# Patient Record
Sex: Male | Born: 1970 | Race: White | Hispanic: No | Marital: Married | Smoking: Never smoker
Health system: Southern US, Community
[De-identification: ages and names within clinical notes are randomized; demographics above are authoritative.]

## PROBLEM LIST (undated history)

## (undated) DIAGNOSIS — C4491 Basal cell carcinoma of skin, unspecified: Secondary | ICD-10-CM

## (undated) DIAGNOSIS — M5020 Other cervical disc displacement, unspecified cervical region: Secondary | ICD-10-CM

## (undated) DIAGNOSIS — M51369 Other intervertebral disc degeneration, lumbar region without mention of lumbar back pain or lower extremity pain: Secondary | ICD-10-CM

## (undated) DIAGNOSIS — G4730 Sleep apnea, unspecified: Secondary | ICD-10-CM

## (undated) DIAGNOSIS — M25512 Pain in left shoulder: Secondary | ICD-10-CM

## (undated) DIAGNOSIS — C61 Malignant neoplasm of prostate: Secondary | ICD-10-CM

## (undated) DIAGNOSIS — M5136 Other intervertebral disc degeneration, lumbar region: Secondary | ICD-10-CM

## (undated) HISTORY — DX: Basal cell carcinoma of skin, unspecified: C44.91

## (undated) HISTORY — DX: Other intervertebral disc degeneration, lumbar region: M51.36

## (undated) HISTORY — PX: APPENDECTOMY: SHX54

## (undated) HISTORY — DX: Other cervical disc displacement, unspecified cervical region: M50.20

## (undated) HISTORY — PX: EYE MUSCLE SURGERY: SHX370

## (undated) HISTORY — PX: WISDOM TOOTH EXTRACTION: SHX21

## (undated) HISTORY — DX: Sleep apnea, unspecified: G47.30

## (undated) HISTORY — DX: Other intervertebral disc degeneration, lumbar region without mention of lumbar back pain or lower extremity pain: M51.369

---

## 2006-05-23 ENCOUNTER — Ambulatory Visit (HOSPITAL_BASED_OUTPATIENT_CLINIC_OR_DEPARTMENT_OTHER): Admission: RE | Admit: 2006-05-23 | Discharge: 2006-05-23 | Payer: Self-pay | Admitting: Otolaryngology

## 2006-05-26 ENCOUNTER — Ambulatory Visit: Payer: Self-pay | Admitting: Internal Medicine

## 2008-10-23 ENCOUNTER — Ambulatory Visit (HOSPITAL_BASED_OUTPATIENT_CLINIC_OR_DEPARTMENT_OTHER): Admission: RE | Admit: 2008-10-23 | Discharge: 2008-10-23 | Payer: Self-pay | Admitting: Ophthalmology

## 2010-04-15 ENCOUNTER — Ambulatory Visit (HOSPITAL_BASED_OUTPATIENT_CLINIC_OR_DEPARTMENT_OTHER)
Admission: RE | Admit: 2010-04-15 | Discharge: 2010-04-15 | Disposition: A | Discharge status: Home / Self Care | Payer: 59 | Source: Ambulatory Visit | Attending: Ophthalmology | Admitting: Ophthalmology

## 2010-04-15 ENCOUNTER — Ambulatory Visit (HOSPITAL_BASED_OUTPATIENT_CLINIC_OR_DEPARTMENT_OTHER): Admit: 2010-04-15 | Payer: Self-pay | Admitting: Ophthalmology

## 2010-04-15 DIAGNOSIS — IMO0002 Reserved for concepts with insufficient information to code with codable children: Secondary | ICD-10-CM | POA: Insufficient documentation

## 2010-06-03 NOTE — Procedures (Signed)
NAMETIEGAN, Stephen [name]               ACCOUNT NO.:  [id number]   MEDICAL RECORD NO.:  [id number]          PATIENT TYPE:  OUT   LOCATION:  SLEEP CENTER                 FACILITY:  [facility]   PHYSICIAN:  Clinton D. Maple Hudson, MD, FCCP, FACPDATE OF BIRTH:  19-May-1972   DATE OF STUDY:                            NOCTURNAL POLYSOMNOGRAM   REFERRING PHYSICIAN:   INDICATIONS:  Hypersomnia with sleep apnea.  Epworth Sleepiness Score  6/24, BMI 32.6, weight 255 pounds.  Home medication listed as Lexapro.   SLEEP ARCHITECTURE:  Total sleep time 347 minutes with sleep efficiency  81%.  Stage 1 was 6%, stage 2 85%, stage 3 and 4 1%, REM 9% of total  sleep time.  Sleep latency 7.5 minutes, REM latency 394 minutes, awake  after sleep onset 77 minutes, arousal index 4.5.  No bed time medication  was taken.   RESPIRATORY DATA:  Apnea/hypopnea index (AHI, RDI) 0 events per hour.  There were no central or obstructive events identified by standard  scoring rules.   OXYGEN DATA:  Moderate snoring with oxygen desaturation to a nadir of  91%.  Main oxygen saturation through this study was 95% on room air.   CARDIAC DATA:  Normal sinus rhythm.   MOVEMENT/PARASOMNIA:  No movement disturbance or bathroom trips.   IMPRESSION/RECOMMENDATIONS:  1. Sleep architecture notable only for reduced percentage time in REM      which may reflect unfamiliar sleep environment.  2. No sleep disordered breathing abnormality noted.  Moderate AHI 0      per hour.  Moderate snoring with oxygen desaturation to a nadir of      91%.      Clinton D. Maple Hudson, MD, [facility], FACP  Diplomate, Biomedical engineer of Sleep Medicine  Electronically Signed     CDY/MEDQ  D:  05/27/2006 09:42:54  T:  05/28/2006 07:34:50  Job:  [id number]

## 2010-06-08 NOTE — Op Note (Signed)
  NAMEGERTRUDE, Stephen [name]               ACCOUNT NO.:  [id number]  MEDICAL RECORD NO.:  [id number]          PATIENT TYPE:  AMB  LOCATION:                               FACILITY:  [facility]  PHYSICIAN:  Pasty Spillers. Maple Hudson, M.D. DATE OF BIRTH:  1970/03/04  DATE OF PROCEDURE:  04/15/2010 DATE OF DISCHARGE:                              OPERATIVE REPORT   OPERATIVE DIAGNOSIS:  Right hypertropia, residual, status post right inferior oblique muscle recession.  POSTOPERATIVE DIAGNOSIS:  Right hypertropia, residual, status post right inferior oblique muscle recession.  PROCEDURE:  Left inferior rectus muscle recession, 3.0 mm.  SURGEON:  Pasty Spillers. Delpha Perko, MD  ANESTHESIA:  General (laryngeal mask).  COMPLICATIONS:  None.  DESCRIPTION OF PROCEDURE:  After routine preoperative evaluation including informed consent, the patient was taken to the room where he was identified by me.  General anesthesia was induced without difficulty after placement of appropriate monitors.  The patient was prepped and draped in a standard sterile fashion.  Lid speculum was placed in the left eye.  Through an inferotemporal fornix incision through conjunctiva and Tenon fascia, left inferior rectus muscle was engaged on a series of muscle hooks and cleared of its fascial attachment.  The tendon was secured with a double-arm 6-0 Vicryl suture, with a double-locking bite at each border of the muscle, 1 mm from the insertion.  The muscle was disinserted, and was reattached to sclera at a measured distance of 3.0 mm posterior to the original insertion, using direct scleral passes in a crossed swords fashion.  The suture ends were tied securely after the position of the muscle had been checked and found to be accurate.  The conjunctiva was closed with two 6-0 Vicryl sutures.  TobraDex ointment was placed in the eye.  The patient was awakened without difficulty and taken to the recovery room in stable condition having  suffered no intraoperative or immediate postoperative complications.     Pasty Spillers. Maple Hudson, M.D.     Cheron Schaumann  D:  04/15/2010  T:  04/16/2010  Job:  [id number]  Electronically Signed by Verne Carrow M.D. on 06/08/2010 10:19:56 AM

## 2015-03-19 LAB — HEPATIC FUNCTION PANEL
ALK PHOS: 84 (ref 25–125)
ALT: 14 (ref 10–40)
AST: 15 (ref 14–40)
Bilirubin, Total: 0.3

## 2015-03-19 LAB — BASIC METABOLIC PANEL
BUN: 14 (ref 4–21)
CREATININE: 1.1 (ref 0.6–1.3)
Glucose: 85
POTASSIUM: 4.5 (ref 3.4–5.3)
SODIUM: 144 (ref 137–147)

## 2015-03-19 LAB — CBC AND DIFFERENTIAL
HCT: 43 (ref 41–53)
Hemoglobin: 14.6 (ref 13.5–17.5)
Platelets: 261 (ref 150–399)
WBC: 5.3

## 2015-03-19 LAB — TSH: TSH: 2.08 (ref 0.41–5.90)

## 2015-12-21 LAB — BASIC METABOLIC PANEL
BUN: 16 (ref 4–21)
Creatinine: 1.1 (ref 0.6–1.3)
GLUCOSE: 89
POTASSIUM: 4.2 (ref 3.4–5.3)
SODIUM: 140 (ref 137–147)

## 2015-12-21 LAB — CBC AND DIFFERENTIAL
HCT: 45 (ref 41–53)
Hemoglobin: 15.4 (ref 13.5–17.5)
WBC: 4.6

## 2015-12-21 LAB — LIPID PANEL
Cholesterol: 168 (ref 0–200)
HDL: 43 (ref 35–70)
LDL Cholesterol: 107
Triglycerides: 91 (ref 40–160)

## 2015-12-21 LAB — HEPATIC FUNCTION PANEL
ALT: 20 (ref 10–40)
AST: 17 (ref 14–40)
Alkaline Phosphatase: 66 (ref 25–125)
BILIRUBIN, TOTAL: 0.5

## 2015-12-21 LAB — TSH: TSH: 2.95 (ref 0.41–5.90)

## 2016-09-01 ENCOUNTER — Encounter: Payer: Self-pay | Admitting: Family Medicine

## 2016-09-01 ENCOUNTER — Ambulatory Visit (INDEPENDENT_AMBULATORY_CARE_PROVIDER_SITE_OTHER): Payer: BLUE CROSS/BLUE SHIELD | Admitting: Family Medicine

## 2016-09-01 VITALS — BP 128/78 | HR 68 | Temp 98.5°F | Ht 73.0 in | Wt 297.2 lb

## 2016-09-01 DIAGNOSIS — R0683 Snoring: Secondary | ICD-10-CM | POA: Insufficient documentation

## 2016-09-01 DIAGNOSIS — I872 Venous insufficiency (chronic) (peripheral): Secondary | ICD-10-CM

## 2016-09-01 DIAGNOSIS — R6882 Decreased libido: Secondary | ICD-10-CM | POA: Diagnosis not present

## 2016-09-01 DIAGNOSIS — G4733 Obstructive sleep apnea (adult) (pediatric): Secondary | ICD-10-CM | POA: Diagnosis not present

## 2016-09-01 DIAGNOSIS — R5383 Other fatigue: Secondary | ICD-10-CM | POA: Diagnosis not present

## 2016-09-01 LAB — BRAIN NATRIURETIC PEPTIDE: Pro B Natriuretic peptide (BNP): 62 pg/mL (ref 0.0–100.0)

## 2016-09-01 MED ORDER — LIRAGLUTIDE -WEIGHT MANAGEMENT 18 MG/3ML ~~LOC~~ SOPN: 1.8000 mL | PEN_INJECTOR | Freq: Every day | SUBCUTANEOUS | 3 refills | Status: DC

## 2016-09-01 MED ORDER — INSULIN PEN NEEDLE 31G X 6 MM MISC
1.0000 [IU] | Freq: Every day | 1 refills | Status: DC
Start: 2016-09-01 — End: 2016-11-02

## 2016-09-01 NOTE — Patient Instructions (Addendum)
Try elevating your legs (feet above the level of your heart) as much as possible for the swelling.  Wear compression stockings as much as tolerable.  We will call you with lab results and recommendations based on results.  You will be contacted to set up the sleep study.  You will probably need an prior authorization for Saxenda.  The pharmacy will notify us and we will take care of it.  It may take a few days.  GO TO: [email or web address]://[email or web address]/[email or web address] for information on savings for [facility].

## 2016-09-01 NOTE — Progress Notes (Signed)
Stephen [name] is a 46 y.o. male is here to [place].   Patient Care Team: Stephen Deutscher, DO as PCP - General (Family Medicine)   History of Present Illness:   Stephen [name], acting as scribe for Stephen [name].  HPI:  Patient comes in today to establish care.  Lives nearby in [place].  Would ike to discuss his weight.  He states he wants to make sure he doesn't have any medical conditions that are preventing him from losing weight.  He would like to find out the best way to go about weight loss.  States he recently started working from home, so that has helped some because he has cut down on snacking and eating out.  He states he will start an exercise regimen or dieting program but it will only last a few days and then he will stop.  States he has a very sedentary job.  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  States he had a physical a few months ago and all his labs came back normal.  He pulled up his recent labs on his phone and his CBC, CMP, lipid panel, and TSH all were normal.  Will add a fasting insulin level today.  Patient dinks beer socially.  No recreational drug use.  He has 2 teenage daughters.    Typical diet is pretty standard, per patient.  He tries not to eat a lot of "junk".  If he snacks, it tends to be more salty foods such as crackers or chips.  He tries not to skip meals.  He drinks flavored water.  No soda.  Occasional iced tea.  He doesn't do much for exercise.  He has 2 treadmills in his house but admits that he doesn't use them very often.  He states he has time in his day that he can take breaks to take walks during the day but he just never gets to it.  He states he has a decrease in energy.  States his sex drive is low.  He states he has had an increase in snoring as well.  He had a sleep study in 2014 and states he had mild sleep apnea.  His snoring has worsened since then.  He states his wife goes and sleeps in another room most nights  because the snoring is so bad.    He has some edema in his lower legs.  History of sclerotherapy.  Patient has never been a smoker.  States he has tried compression stockings and they did not help him.  Health Maintenance Due  Topic Date Due  . TETANUS/TDAP  03/08/1991  . INFLUENZA VACCINE  08/16/2016   Depression screen PHQ 2/9 09/01/2016  Decreased Interest 0  Down, Depressed, Hopeless 0  PHQ - 2 Score 0   PMHx, SurgHx, SocialHx, Medications, and Allergies were reviewed in the Visit Navigator and updated as appropriate.   Past Medical History:  Diagnosis Date  . Degenerative disc disease, lumbar     Past Surgical History:  Procedure Laterality Date  . APPENDECTOMY     History reviewed. No pertinent family history. Social History  Substance Use Topics  . Smoking status: Never Smoker  . Smokeless tobacco: Never Used  . Alcohol use Yes     Comment: Socially    Current Medications and Allergies:   Current Outpatient Prescriptions:  .  cyclobenzaprine (FLEXERIL) 10 MG tablet, Take 10 mg by mouth as needed for muscle spasms., Disp: ,  Rfl:  .  nabumetone (RELAFEN) 500 MG tablet, Take 500 mg by mouth as needed., Disp: , Rfl:  .  Insulin Pen Needle 31G X 6 MM MISC, Inject 1 Units into the skin daily., Disp: 90 each, Rfl: 1 .  Liraglutide -Weight Management (SAXENDA) 18 MG/3ML SOPN, Inject 1.8 mLs into the skin daily. start with the lowest (0.6) setting.  After a few days, increase to 1.2.  If you still have little or no nausea, increase to the highest (1.8) setting., Disp: 3 mL, Rfl: 3   Allergies  Allergen Reactions  . Penicillins Nausea Only    Unknown - Childhood allergy   Review of Systems:   Pertinent items are noted in the HPI. Otherwise, ROS is negative.  Vitals:   Vitals:   09/01/16 0832  BP: 128/78  Pulse: 68  Temp: 98.5 F (36.9 C)  TempSrc: Oral  SpO2: 98%  Weight: 297 lb 3.2 oz (134.8 kg)  Height: 6\' 1"  (1.854 m)     Body mass index is 39.21  kg/m. Physical Exam:   Physical Exam  Constitutional: He is oriented to person, place, and time. He appears well-developed and well-nourished. No distress.  HENT:  Head: Normocephalic and atraumatic.  Right Ear: External ear normal.  Left Ear: External ear normal.  Nose: Nose normal.  Mouth/Throat: Oropharynx is clear and moist.  Eyes: Pupils are equal, round, and reactive to light. Conjunctivae and EOM are normal.  Neck: Normal range of motion. Neck supple.  Cardiovascular: Normal rate, regular rhythm, normal heart sounds and intact distal pulses.   Pulmonary/Chest: Effort normal and breath sounds normal.  Abdominal: Soft. Bowel sounds are normal.  Musculoskeletal: Normal range of motion.  Neurological: He is alert and oriented to person, place, and time.  Skin: Skin is warm and dry.  Venous stasis changes bilateral lower extremities.  Psychiatric: He has a normal mood and affect. His behavior is normal. Judgment and thought content normal.  Nursing note and vitals reviewed.  EKG: sinus bradycardia without ST, T changes  Assessment and Plan:   Stephen [name] was seen today for establish care.  Diagnoses and all orders for this visit:  Obstructive sleep apnea syndrome Comments: Hx of known mild OSA. Worsened and patient would like another test to see if CPAP warrented at this point.  Orders: -     Home sleep test; Future  Chronic venous stasis dermatitis Comments: No red flags today.  Orders: -     Insulin, Free (Bioactive) -     Brain natriuretic peptide -     EKG 12-Lead  Fatigue, unspecified type Comments: Labs pending. Reviewed healthy eating and exercise habits.  Orders: -     Insulin, Free (Bioactive) -     Home sleep test; Future -     Testosterone Free with SHBG  Decreased sex drive Comments: Will check am testosterone panel. Orders: -     Testosterone Free with SHBG  Morbid obesity ([place]) Comments: The patient is asked to make an attempt to improve diet  and exercise patterns to aid in medical management of this problem.  Orders: -     Insulin, Free (Bioactive) -     Liraglutide -Weight Management (SAXENDA) 18 MG/3ML SOPN; Inject 1.8 mLs into the skin daily. start with the lowest (0.6) setting.  After a few days, increase to 1.2.  If you still have little or no nausea, increase to the highest (1.8) setting. -     Insulin Pen Needle 31G X 6  MM MISC; Inject 1 Units into the skin daily.   . Reviewed expectations re: course of current medical issues. . Discussed self-management of symptoms. . Outlined signs and symptoms indicating need for more acute intervention. . Patient verbalized understanding and all questions were answered. Marland Kitchen Health Maintenance issues including appropriate healthy diet, exercise, and smoking avoidance were discussed with patient. . See orders for this visit as documented in the electronic medical record. . Patient received an After Visit Summary.  [name] served as Education administrator during this visit. History, Physical, and Plan performed by medical provider. The above documentation has been reviewed and is accurate and complete. Stephen [name], D.O.  Records requested if needed. Time spent with the patient:30 minutes, of which >50% was spent in obtaining information about his symptoms, reviewing his previous labs, evaluations, and treatments, counseling him about his condition (please see the discussed topics above), and developing a plan to further investigate it; he had a number of questions which I addressed.   Stephen Deutscher, DO [place], [facility] 09/01/2016

## 2016-09-04 ENCOUNTER — Other Ambulatory Visit: Payer: Self-pay

## 2016-09-04 LAB — TESTOSTERONE, FREE AND TOTAL (INCLUDES SHBG)-(MALES)
Sex Hormone Binding: 34 nmol/L (ref 10–50)
Testosterone, Free: 74.9 pg/mL (ref 47.0–244.0)
Testosterone-% Free: 2 % (ref 1.6–2.9)
Testosterone: 378 ng/dL (ref 250–827)

## 2016-09-04 LAB — INSULIN, FREE (BIOACTIVE): Insulin, Free: 8 u[IU]/mL (ref 1.5–14.9)

## 2016-09-04 MED ORDER — LIRAGLUTIDE -WEIGHT MANAGEMENT 18 MG/3ML ~~LOC~~ SOPN: 1.8000 mg | PEN_INJECTOR | Freq: Every day | SUBCUTANEOUS | 3 refills | Status: DC

## 2016-09-05 ENCOUNTER — Telehealth: Payer: Self-pay | Admitting: Family Medicine

## 2016-09-05 NOTE — Telephone Encounter (Signed)
ROI faxed to [facility] @ [place]

## 2016-09-07 ENCOUNTER — Encounter: Payer: Self-pay | Admitting: Family Medicine

## 2016-09-12 DIAGNOSIS — L821 Other seborrheic keratosis: Secondary | ICD-10-CM | POA: Diagnosis not present

## 2016-09-12 DIAGNOSIS — D225 Melanocytic nevi of trunk: Secondary | ICD-10-CM | POA: Diagnosis not present

## 2016-09-12 DIAGNOSIS — L570 Actinic keratosis: Secondary | ICD-10-CM | POA: Diagnosis not present

## 2016-09-12 DIAGNOSIS — C44619 Basal cell carcinoma of skin of left upper limb, including shoulder: Secondary | ICD-10-CM | POA: Diagnosis not present

## 2016-09-12 DIAGNOSIS — D485 Neoplasm of uncertain behavior of skin: Secondary | ICD-10-CM | POA: Diagnosis not present

## 2016-09-12 NOTE — Telephone Encounter (Signed)
Rec'd from [place] @ [employer] forwarded 21 pages to [employer] DO

## 2016-09-20 ENCOUNTER — Encounter: Payer: Self-pay | Admitting: Family Medicine

## 2016-09-20 LAB — ESTIMATED GFR
GFR CALC NON AF AMER: 71
GFR CALC NON AF AMER: 73

## 2016-10-02 ENCOUNTER — Ambulatory Visit (INDEPENDENT_AMBULATORY_CARE_PROVIDER_SITE_OTHER): Payer: BLUE CROSS/BLUE SHIELD | Admitting: Family Medicine

## 2016-10-02 ENCOUNTER — Encounter: Payer: Self-pay | Admitting: Family Medicine

## 2016-10-02 VITALS — BP 124/78 | HR 76 | Temp 97.7°F | Ht 73.0 in | Wt 287.8 lb

## 2016-10-02 DIAGNOSIS — Z23 Encounter for immunization: Secondary | ICD-10-CM | POA: Diagnosis not present

## 2016-10-02 DIAGNOSIS — R600 Localized edema: Secondary | ICD-10-CM | POA: Diagnosis not present

## 2016-10-02 MED ORDER — PHENTERMINE HCL 15 MG PO TBDP: 15.0000 mg | ORAL_TABLET | Freq: Every day | ORAL | 0 refills | Status: DC

## 2016-10-02 MED ORDER — LIRAGLUTIDE -WEIGHT MANAGEMENT 18 MG/3ML ~~LOC~~ SOPN: 1.8000 mg | PEN_INJECTOR | Freq: Every day | SUBCUTANEOUS | 3 refills | Status: DC

## 2016-10-02 MED ORDER — HYDROCHLOROTHIAZIDE 12.5 MG PO CAPS: 12.5000 mg | ORAL_CAPSULE | Freq: Every day | ORAL | 0 refills | Status: DC

## 2016-10-02 NOTE — Progress Notes (Signed)
Stephen [name] is a 46 y.o. male is here for follow up.  History of Present Illness:   Water quality scientist, CMA, acting as scribe for Dr. Juleen China.  HPI:  Patient comes in today for follow up.  States he is doing well on Saxenda.  He has lost 10 pounds since last visit.  He states he still has a lack of energy.  Has not been exercising very much.  He would like to get his flu shot and Tdap today. Labs received and reviewed. Cardiovascular ROS: negative for - chest pain, dyspnea on exertion, irregular heartbeat or palpitations.  There are no preventive care reminders to display for this patient. Depression screen PHQ 2/9 09/01/2016  Decreased Interest 0  Down, Depressed, Hopeless 0  PHQ - 2 Score 0   PMHx, SurgHx, SocialHx, FamHx, Medications, and Allergies were reviewed in the Visit Navigator and updated as appropriate.   Patient Active Problem List   Diagnosis Date Noted  . Snoring 09/01/2016  . Edema 09/01/2016  . Fatigue 09/01/2016  . Decreased sex drive 95/18/8416  . Obesity (BMI 30-39.9) 09/01/2016   Social History  Substance Use Topics  . Smoking status: Never Smoker  . Smokeless tobacco: Never Used  . Alcohol use Yes     Comment: Socially   Current Medications and Allergies:   .  cyclobenzaprine (FLEXERIL) 10 MG tablet, Take 10 mg by mouth as needed for muscle spasms., Disp: , Rfl:  .  Insulin Pen Needle 31G X 6 MM MISC, Inject 1 Units into the skin daily., Disp: 90 each, Rfl: 1 .  Liraglutide -Weight Management (SAXENDA) 18 MG/3ML SOPN, Inject 1.8 mg into the skin daily., Disp: 3 pen, Rfl: 3 .  nabumetone (RELAFEN) 500 MG tablet, Take 500 mg by mouth as needed., Disp: , Rfl:  .  hydrochlorothiazide (MICROZIDE) 12.5 MG capsule, Take 1 capsule (12.5 mg total) by mouth daily., Disp: 30 capsule, Rfl: 0  Allergies  Allergen Reactions  . Penicillins Nausea Only    Unknown - Childhood allergy   Review of Systems   Pertinent items are noted in the HPI. Otherwise, ROS is  negative.  Vitals:   Vitals:   10/02/16 0819  BP: 124/78  Pulse: 76  Temp: 97.7 F (36.5 C)  TempSrc: Oral  SpO2: 97%  Weight: 287 lb 12.8 oz (130.5 kg)  Height: _0  (1.854 m)     Body mass index is 37.97 kg/m.   Physical Exam:   Physical Exam  Constitutional: He is oriented to person, place, and time. He appears well-developed and well-nourished. No distress.  HENT:  Head: Normocephalic and atraumatic.  Right Ear: External ear normal.  Left Ear: External ear normal.  Nose: Nose normal.  Mouth/Throat: Oropharynx is clear and moist.  Eyes: Pupils are equal, round, and reactive to light. Conjunctivae and EOM are normal.  Neck: Normal range of motion. Neck supple.  Cardiovascular: Normal rate, regular rhythm, normal heart sounds and intact distal pulses.   Pulmonary/Chest: Effort normal and breath sounds normal.  Abdominal: Soft. Bowel sounds are normal.  Musculoskeletal: Normal range of motion.  Neurological: He is alert and oriented to person, place, and time.  Skin: Skin is warm and dry.  Psychiatric: He has a normal mood and affect. His behavior is normal. Judgment and thought content normal.  Nursing note and vitals reviewed.   Results for orders placed or performed in visit on 09/20/16  CBC and differential  Result Value Ref Range   Hemoglobin 15.4 13.5 -  17.5   HCT 45 41 - 53   WBC 4.6   Basic metabolic panel  Result Value Ref Range   Glucose 89    BUN 16 4 - 21   Creatinine 1.1 0.6 - 1.3   Potassium 4.2 3.4 - 5.3   Sodium 140 137 - 147  Lipid panel  Result Value Ref Range   Triglycerides 91 40 - 160   Cholesterol 168 0 - 200   HDL 43 35 - 70   LDL Cholesterol 107   Hepatic function panel  Result Value Ref Range   Alkaline Phosphatase 66 25 - 125   ALT 20 10 - 40   AST 17 14 - 40   Bilirubin, Total 0.5   TSH  Result Value Ref Range   TSH 2.95 0.41 - 5.90  Estimated GFR  Result Value Ref Range   EGFR (Non-African Amer.) 71   Estimated GFR    Result Value Ref Range   EGFR (Non-African Amer.) 73   CBC and differential  Result Value Ref Range   Hemoglobin 14.6 13.5 - 17.5   HCT 43 41 - 53   Platelets 261 150 - 399   WBC 5.3   Basic metabolic panel  Result Value Ref Range   Glucose 85    BUN 14 4 - 21   Creatinine 1.1 0.6 - 1.3   Potassium 4.5 3.4 - 5.3   Sodium 144 137 - 147  Hepatic function panel  Result Value Ref Range   Alkaline Phosphatase 84 25 - 125   ALT 14 10 - 40   AST 15 14 - 40   Bilirubin, Total 0.3   TSH  Result Value Ref Range   TSH 2.08 0.41 - 5.90   Assessment and Plan:   Stephen [name] was seen today for follow-up.  Diagnoses and all orders for this visit:  Need for Tdap vaccination -     Tdap vaccine greater than or equal to 7yo IM  Morbid obesity ([place]) Comments: The patient is asked to make an attempt to improve diet and exercise patterns to aid in medical management of this problem.  Orders: -     Phentermine HCl 15 MG TBDP; Take 15 mg by mouth daily. -     Liraglutide -Weight Management (SAXENDA) 18 MG/3ML SOPN; Inject 1.8 mg into the skin daily.  Need for immunization against influenza -     Flu Vaccine QUAD 36+ mos IM  Localized edema -     hydrochlorothiazide (MICROZIDE) 12.5 MG capsule; Take 1 capsule (12.5 mg total) by mouth daily.   . Reviewed expectations re: course of current medical issues. . Discussed self-management of symptoms. . Outlined signs and symptoms indicating need for more acute intervention. . Patient verbalized understanding and all questions were answered. Marland Kitchen Health Maintenance issues including appropriate healthy diet, exercise, and smoking avoidance were discussed with patient. . See orders for this visit as documented in the electronic medical record. . Patient received an After Visit Summary.  CMA served as Education administrator during this visit. History, Physical, and Plan performed by medical provider. The above documentation has been reviewed and is accurate and  complete. Briscoe Deutscher, D.O.  Briscoe Deutscher, DO [place], [facility] 10/07/2016  Future Appointments Date Time Provider [place]  11/02/2016 8:15 AM Briscoe Deutscher, DO [facility] None

## 2016-10-09 ENCOUNTER — Encounter: Payer: Self-pay | Admitting: Family Medicine

## 2016-10-16 DIAGNOSIS — C44619 Basal cell carcinoma of skin of left upper limb, including shoulder: Secondary | ICD-10-CM | POA: Diagnosis not present

## 2016-10-17 ENCOUNTER — Ambulatory Visit: Payer: BLUE CROSS/BLUE SHIELD | Admitting: Family Medicine

## 2016-10-28 ENCOUNTER — Other Ambulatory Visit: Payer: Self-pay | Admitting: Family Medicine

## 2016-10-28 DIAGNOSIS — R600 Localized edema: Secondary | ICD-10-CM

## 2016-11-02 ENCOUNTER — Encounter: Payer: Self-pay | Admitting: Family Medicine

## 2016-11-02 ENCOUNTER — Ambulatory Visit (INDEPENDENT_AMBULATORY_CARE_PROVIDER_SITE_OTHER): Payer: BLUE CROSS/BLUE SHIELD | Admitting: Family Medicine

## 2016-11-02 VITALS — BP 120/74 | HR 73 | Temp 97.7°F | Ht 73.0 in | Wt 287.6 lb

## 2016-11-02 DIAGNOSIS — E669 Obesity, unspecified: Secondary | ICD-10-CM | POA: Diagnosis not present

## 2016-11-02 MED ORDER — PHENTERMINE HCL 37.5 MG PO TABS: 37.5000 mg | ORAL_TABLET | Freq: Every day | ORAL | 2 refills | Status: DC

## 2016-11-02 MED ORDER — PHENTERMINE HCL 37.5 MG PO TABS: 37.5000 mg | ORAL_TABLET | Freq: Every day | ORAL | 0 refills | Status: DC

## 2016-11-02 NOTE — Progress Notes (Signed)
Stephen [name] is a 46 y.o. male is here for follow up.  History of Present Illness:   Water quality scientist, CMA, acting as scribe for Dr. Juleen China.  HPI:   1. Obesity (BMI 30-39.9). 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 Lost 10 pounds and has maintained. Has been using Phentermine 15 mg po a few times a week. Tolerating well. Would like to increase regimen now. No edema. No HA, dizziness, CP, SOB. Making better food choices. Looking into racketball.   There are no preventive care reminders to display for this patient. Depression screen PHQ 2/9 09/01/2016  Decreased Interest 0  Down, Depressed, Hopeless 0  PHQ - 2 Score 0   PMHx, SurgHx, SocialHx, FamHx, Medications, and Allergies were reviewed in the Visit Navigator and updated as appropriate.   Patient Active Problem List   Diagnosis Date Noted  . Snoring 09/01/2016  . Edema 09/01/2016  . Fatigue 09/01/2016  . Decreased sex drive 85/27/7824  . Obesity (BMI 30-39.9) 09/01/2016   Social History  Substance Use Topics  . Smoking status: Never Smoker  . Smokeless tobacco: Never Used  . Alcohol use Yes     Comment: Socially   Current Medications and Allergies:   Current Outpatient Prescriptions:  .  hydrochlorothiazide (MICROZIDE) 12.5 MG capsule, TAKE 1 CAPSULE BY MOUTH EVERY DAY, TAKES PRN .  nabumetone (RELAFEN) 500 MG tablet, Take 500 mg by mouth as needed., Disp: , Rfl:  .  phentermine (ADIPEX-P) 15 MG tablet, Take 1 tablet (15 mg total) by mouth daily before breakfast., Disp: 30 tablet   Allergies  Allergen Reactions  . Penicillins Nausea Only    Unknown - Childhood allergy   Review of Systems   Pertinent items are noted in the HPI. Otherwise, ROS is negative.  Vitals:   Vitals:   11/02/16 0807  BP: 120/74  Pulse: 73  Temp: 97.7 F (36.5 C)  TempSrc: Oral  SpO2: 98%  Weight: 287 lb 9.6 oz (130.5 kg)  Height: 6\' 1"  (1.854 m)     Body mass index is 37.94 kg/m.   Physical Exam:   Physical Exam    Constitutional: He is oriented to person, place, and time. He appears well-developed and well-nourished. No distress.  HENT:  Head: Normocephalic and atraumatic.  Right Ear: External ear normal.  Left Ear: External ear normal.  Nose: Nose normal.  Mouth/Throat: Oropharynx is clear and moist.  Eyes: Pupils are equal, round, and reactive to light. Conjunctivae and EOM are normal.  Neck: Normal range of motion. Neck supple.  Cardiovascular: Normal rate, regular rhythm, normal heart sounds and intact distal pulses.   Pulmonary/Chest: Effort normal and breath sounds normal.  Abdominal: Soft. Bowel sounds are normal.  Musculoskeletal: He exhibits no edema.  Neurological: He is alert and oriented to person, place, and time.  Skin: Skin is warm and dry.  Psychiatric: He has a normal mood and affect. His behavior is normal. Judgment and thought content normal.  Nursing note and vitals reviewed.    Assessment and Plan:   Stephen [name] was seen today for follow-up.  Diagnoses and all orders for this visit:  Obesity (BMI 30-39.9) Comments: Unfortunately, the patient's insurance will not pay for Saxenda. Will trial 3 months of Phentermine, then recheck. The patient is asked to make an attempt to improve diet and exercise patterns to aid in medical management of this problem.  Orders: -     phentermine (ADIPEX-P) 37.5 MG tablet; Take 1 tablet (37.5 mg total) by  mouth daily before breakfast. -     phentermine (ADIPEX-P) 37.5 MG tablet; Take 1 tablet (37.5 mg total) by mouth daily before breakfast. -     phentermine (ADIPEX-P) 37.5 MG tablet; Take 1 tablet (37.5 mg total) by mouth daily before breakfast.   . Reviewed expectations re: course of current medical issues. . Discussed self-management of symptoms. . Outlined signs and symptoms indicating need for more acute intervention. . Patient verbalized understanding and all questions were answered. Marland Kitchen Health Maintenance issues including appropriate  healthy diet, exercise, and smoking avoidance were discussed with patient. . See orders for this visit as documented in the electronic medical record. . Patient received an After Visit Summary.  CMA served as Education administrator during this visit. History, Physical, and Plan performed by medical provider. The above documentation has been reviewed and is accurate and complete. Briscoe Deutscher, D.O.  Briscoe Deutscher, DO [place], [facility] 11/02/2016  Future Appointments Date Time Provider [place]  02/02/2017 8:00 AM Briscoe Deutscher, DO [facility] None

## 2016-11-27 ENCOUNTER — Other Ambulatory Visit: Payer: Self-pay

## 2016-11-27 DIAGNOSIS — R600 Localized edema: Secondary | ICD-10-CM

## 2016-11-27 MED ORDER — HYDROCHLOROTHIAZIDE 12.5 MG PO CAPS
ORAL_CAPSULE | ORAL | 1 refills | Status: DC
Start: 2016-11-27 — End: 2019-09-15

## 2017-02-02 ENCOUNTER — Encounter: Payer: Self-pay | Admitting: Family Medicine

## 2017-02-02 ENCOUNTER — Ambulatory Visit: Payer: BLUE CROSS/BLUE SHIELD | Admitting: Family Medicine

## 2017-02-02 DIAGNOSIS — G4733 Obstructive sleep apnea (adult) (pediatric): Secondary | ICD-10-CM | POA: Diagnosis not present

## 2017-02-02 DIAGNOSIS — R600 Localized edema: Secondary | ICD-10-CM | POA: Diagnosis not present

## 2017-02-02 DIAGNOSIS — R3911 Hesitancy of micturition: Secondary | ICD-10-CM | POA: Insufficient documentation

## 2017-02-02 DIAGNOSIS — R7303 Prediabetes: Secondary | ICD-10-CM

## 2017-02-02 LAB — COMPREHENSIVE METABOLIC PANEL
ALT: 11 U/L (ref 0–53)
AST: 15 U/L (ref 0–37)
Albumin: 4.2 g/dL (ref 3.5–5.2)
Alkaline Phosphatase: 64 U/L (ref 39–117)
BUN: 15 mg/dL (ref 6–23)
CO2: 30 mEq/L (ref 19–32)
Calcium: 9.3 mg/dL (ref 8.4–10.5)
Chloride: 104 mEq/L (ref 96–112)
Creatinine, Ser: 1.22 mg/dL (ref 0.40–1.50)
GFR: 68.3 mL/min (ref >60.00)
Glucose, Bld: 95 mg/dL (ref 70–99)
Potassium: 4.2 mEq/L (ref 3.5–5.1)
Sodium: 140 mEq/L (ref 135–145)
Total Bilirubin: 0.9 mg/dL (ref 0.2–1.2)
Total Protein: 7 g/dL (ref 6.0–8.3)

## 2017-02-02 LAB — POCT GLYCOSYLATED HEMOGLOBIN (HGB A1C): Hemoglobin A1C: 5

## 2017-02-02 LAB — BRAIN NATRIURETIC PEPTIDE: Pro B Natriuretic peptide (BNP): 24 pg/mL (ref 0.0–100.0)

## 2017-02-02 LAB — PSA: PSA: 1.11 ng/mL (ref 0.10–4.00)

## 2017-02-02 MED ORDER — SEMAGLUTIDE(0.25 OR 0.5MG/DOS) 2 MG/1.5ML ~~LOC~~ SOPN: 0.2500 mg | PEN_INJECTOR | SUBCUTANEOUS | 0 refills | Status: DC

## 2017-02-02 NOTE — Progress Notes (Signed)
Stephen [name] is a 47 y.o. male is here for follow up.  History of Present Illness:   HPI: See Assessment and Plan section for Problem Based Charting of issues discussed today.  There are no preventive care reminders to display for this patient.   Depression screen PHQ 2/9 09/01/2016  Decreased Interest 0  Down, Depressed, Hopeless 0  PHQ - 2 Score 0   PMHx, SurgHx, SocialHx, FamHx, Medications, and Allergies were reviewed in the Visit Navigator and updated as appropriate.   Patient Active Problem List   Diagnosis Date Noted  . Bilateral lower extremity edema 02/02/2017  . Urinary hesitancy 02/02/2017  . Morbid obesity ([place]) 02/02/2017  . OSA (obstructive sleep apnea) 02/02/2017  . Decreased sex drive 82/95/6213   Social History   Tobacco Use  . Smoking status: Never Smoker  . Smokeless tobacco: Never Used  Substance Use Topics  . Alcohol use: Yes    Comment: Socially  . Drug use: No   Current Medications and Allergies:   .  hydrochlorothiazide (MICROZIDE) 12.5 MG capsule, TAKE 1 CAPSULE BY MOUTH EVERY DAY, Disp: 90 capsule, Rfl: 1 - PATIENT USING PRN  Allergies  Allergen Reactions  . Penicillins Nausea Only    Unknown - Childhood allergy   Review of Systems   Pertinent items are noted in the HPI. Otherwise, ROS is negative.  Vitals:   Vitals:   02/02/17 0756  BP: 116/76  Pulse: 77  Temp: 97.9 F (36.6 C)  TempSrc: Oral  SpO2: 99%  Weight: 288 lb 3.2 oz (130.7 kg)  Height: '6\' 1"'$  (1.854 m)     Body mass index is 38.02 kg/m.   Physical Exam:   Physical Exam  Constitutional: He is oriented to person, place, and time. He appears well-developed and well-nourished. No distress.  HENT:  Head: Normocephalic and atraumatic.  Right Ear: External ear normal.  Left Ear: External ear normal.  Nose: Nose normal.  Mouth/Throat: Oropharynx is clear and moist.  Eyes: Conjunctivae and EOM are normal. Pupils are equal, round, and reactive to light.  Neck:  Normal range of motion. Neck supple.  Cardiovascular: Normal rate, regular rhythm, normal heart sounds and intact distal pulses.  Pulmonary/Chest: Effort normal and breath sounds normal.  Abdominal: Soft. Bowel sounds are normal.  Musculoskeletal: He exhibits edema.  Neurological: He is alert and oriented to person, place, and time.  Skin: Skin is warm and dry.  Psychiatric: He has a normal mood and affect. His behavior is normal. Judgment and thought content normal.  Nursing note and vitals reviewed.   Results for orders placed or performed in visit on 02/02/17  PSA  Result Value Ref Range   PSA 1.11 0.10 - 4.00 ng/mL  Comp Met (CMET)  Result Value Ref Range   Sodium 140 135 - 145 mEq/L   Potassium 4.2 3.5 - 5.1 mEq/L   Chloride 104 96 - 112 mEq/L   CO2 30 19 - 32 mEq/L   Glucose, Bld 95 70 - 99 mg/dL   BUN 15 6 - 23 mg/dL   Creatinine, Ser 1.22 0.40 - 1.50 mg/dL   Total Bilirubin 0.9 0.2 - 1.2 mg/dL   Alkaline Phosphatase 64 39 - 117 U/L   AST 15 0 - 37 U/L   ALT 11 0 - 53 U/L   Total Protein 7.0 6.0 - 8.3 g/dL   Albumin 4.2 3.5 - 5.2 g/dL   Calcium 9.3 8.4 - 10.5 mg/dL   GFR 68.30 >60.00 mL/min  Brain natriuretic peptide  Result Value Ref Range   Pro B Natriuretic peptide (BNP) 24.0 0.0 - 100.0 pg/mL  POCT glycosylated hemoglobin (Hb A1C)  Result Value Ref Range   Hemoglobin A1C 5.0    Assessment and Plan:   Diagnoses and all orders for this visit:  Morbid obesity ([place]) Comments: Patient's weight is stable.  He did very well on initially but was unable to continue this medication due to cost.  Trial of phentermine failed due to causing urinary retention.  He continues try to exercise and make healthy food choices.  We discussed multiple treatment options.  See below.  Plan: 1. Diagnostic studies to rule out secondary causes of obesity: see below. 2. General patient education:   Average sustained weight loss in long-term studies w/lifestyle interventions alone is  10-15 lb.  Importance of long-term maintenance tx in weight loss.  Use non-food self-rewards to reinforce behavior changes.  Elicit support from others; identify saboteurs.  Practical target weight is usually around 2 BMI units below current weight. 3. Diet interventions:   Risks of dieting were reviewed, including fatigue, temporary hair loss, gallstone formation, gout, and with very low calorie diets, electrolyte abnormalities, nutrient inadequacies, and loss of lean body mass. 4. Exercise intervention:   Informal measures, e.g. taking stairs instead of elevator.  Formal exercise regimen options. 5. Other behavioral treatment: stress management. 6. Other treatment: Medication: GLP-1 RA. 7. Patient to keep a weight log that we will review at follow up. 8. Follow up: 3 months and as needed.  Orders: -     Comp Met (CMET) -     POCT glycosylated hemoglobin (Hb A1C)  Bilateral lower extremity edema Comments: Patient continues to have lower extremity edema.  There is some pitting midshin bilaterally.  His BNP is low.  His blood pressure is normal.  He takes hydrochlorothiazide as needed.  He may benefit by taking that daily. Orders: -     Brain natriuretic peptide  Urinary hesitancy Comments: PSA at normal level today.  This may be medication induced urinary hesitancy.  He will monitor symptoms and report any new concerns. Orders: -     PSA  OSA (obstructive sleep apnea) Comments: See lab results note.  We are working on clarifying whether or not the patient is using a CPAP at this point.  Prediabetes Comments: Previous elevated fasting insulin level.  I believe that his low A1c is truly due to rebounding blood sugars.  He did very well with a GLP-1 receptor agonist in the past.  I do believe it would be better for his health in general to trial this medication again.  I will order Trulicity in this case.  We will see if insurance will cover in light of his  comorbidities.  . Reviewed expectations re: course of current medical issues. . Discussed self-management of symptoms. . Outlined signs and symptoms indicating need for more acute intervention. . Patient verbalized understanding and all questions were answered. Marland Kitchen Health Maintenance issues including appropriate healthy diet, exercise, and smoking avoidance were discussed with patient. . See orders for this visit as documented in the electronic medical record. . Patient received an After Visit Summary.  Briscoe Deutscher, DO [place], [facility] 02/02/2017  No future appointments.

## 2017-02-03 DIAGNOSIS — R7303 Prediabetes: Secondary | ICD-10-CM | POA: Insufficient documentation

## 2017-02-03 MED ORDER — DULAGLUTIDE 0.75 MG/0.5ML ~~LOC~~ SOAJ: SUBCUTANEOUS | 3 refills | Status: DC

## 2017-02-07 ENCOUNTER — Other Ambulatory Visit: Payer: Self-pay

## 2017-02-07 ENCOUNTER — Telehealth: Payer: Self-pay

## 2017-02-07 DIAGNOSIS — R5383 Other fatigue: Secondary | ICD-10-CM

## 2017-02-07 DIAGNOSIS — R0683 Snoring: Secondary | ICD-10-CM

## 2017-02-07 DIAGNOSIS — G4733 Obstructive sleep apnea (adult) (pediatric): Secondary | ICD-10-CM

## 2017-02-07 NOTE — Telephone Encounter (Signed)
auth started for Trulicity on cover my meds  Key [id number] Response in 72 hrs.

## 2017-02-09 NOTE — Telephone Encounter (Signed)
Fax received from insurance denied due to:  1. Pt does not have type 2 DM  2. Must have tried metformin, sulfonylurea, or combination product with the two. Or insulin.   Please advise  Sent ppw to scan.

## 2017-02-12 ENCOUNTER — Telehealth: Payer: Self-pay | Admitting: Family Medicine

## 2017-02-12 ENCOUNTER — Telehealth: Payer: Self-pay

## 2017-02-12 NOTE — Telephone Encounter (Signed)
PA was denied

## 2017-02-12 NOTE — Telephone Encounter (Signed)
Copied from [place] [phone]. Topic: General - Other >> Feb 12, 2017  3:11 PM Synthia Innocent wrote: Reason for CRM: Dulaglutide (TRULICITY) 0.76 JH/1.8DU SOPN needs PA. Please advise

## 2017-02-12 NOTE — Telephone Encounter (Signed)
Copied from [place] [phone]. Topic: General - Other >> Feb 12, 2017  3:11 PM Synthia Innocent wrote: Reason for CRM: Dulaglutide (TRULICITY) 1.55 MC/8.0EM SOPN needs PA. Please advise

## 2017-02-12 NOTE — Telephone Encounter (Signed)
Copied from [place] [phone]. Topic: General - Other >> Feb 12, 2017  3:11 PM Synthia Innocent wrote: Reason for CRM: Dulaglutide (TRULICITY) 2.42 AS/3.4HD SOPN needs PA. Please avise

## 2017-02-12 NOTE — Telephone Encounter (Signed)
See note

## 2017-02-12 NOTE — Telephone Encounter (Signed)
Copied from [place] [id number]. Topic: General - Other >> Feb 12, 2017  3:11 PM Synthia Innocent wrote: Reason for CRM: Dulaglutide (TRULICITY) 5.05 WP/7.9YI SOPN needs PA. Please avise

## 2017-02-12 NOTE — Telephone Encounter (Signed)
Copied from [place] [phone]. Topic: General - Other >> Feb 12, 2017  3:11 PM Synthia Innocent wrote: Reason for CRM: Dulaglutide (TRULICITY) 9.57 MB/3.4YZ SOPN needs PA. Please advise

## 2017-02-12 NOTE — Telephone Encounter (Signed)
Was denied see other message please advise.

## 2017-02-13 NOTE — Telephone Encounter (Signed)
Josem Kaufmann was submitted and denied. Message sent to Dr. Juleen China to get next step.

## 2017-02-18 NOTE — Telephone Encounter (Signed)
Unfortunately, we have tried to have this prior authorization multiple times without success. Saxenda too expensive. Please reach out to patient to let him know. An option COULD be to help with samples for 3 months if he would like to complete.

## 2017-02-19 NOTE — Telephone Encounter (Signed)
Either Ozempic or Trulicity fine.

## 2017-02-19 NOTE — Telephone Encounter (Signed)
See below

## 2017-02-19 NOTE — Telephone Encounter (Signed)
Called patient he would like to get samples for three months. Do you want to give him ozempic that he was given? Pt informed when I have everything together for him we will call so that he can pick up.

## 2017-02-20 MED ORDER — SEMAGLUTIDE(0.25 OR 0.5MG/DOS) 2 MG/1.5ML ~~LOC~~ SOPN: 0.5000 mg | PEN_INJECTOR | SUBCUTANEOUS | 0 refills | Status: DC

## 2017-02-20 NOTE — Telephone Encounter (Signed)
Called patient let him know that we have 3 pen for him he will come by our office and pick up. I have documented in log and chart.

## 2017-02-20 NOTE — Addendum Note (Signed)
Addended by: Francella Solian on: 02/20/2017 01:18 PM   Modules accepted: Orders

## 2017-02-23 ENCOUNTER — Ambulatory Visit (HOSPITAL_BASED_OUTPATIENT_CLINIC_OR_DEPARTMENT_OTHER): Payer: BLUE CROSS/BLUE SHIELD | Attending: Family Medicine | Admitting: Internal Medicine

## 2017-02-23 DIAGNOSIS — R5383 Other fatigue: Secondary | ICD-10-CM | POA: Insufficient documentation

## 2017-02-23 DIAGNOSIS — G4733 Obstructive sleep apnea (adult) (pediatric): Secondary | ICD-10-CM | POA: Diagnosis not present

## 2017-02-23 DIAGNOSIS — R0683 Snoring: Secondary | ICD-10-CM | POA: Insufficient documentation

## 2017-02-28 NOTE — Telephone Encounter (Signed)
[employer] is calling to follow up and see if ofc would like to file a appeal on PA of trulicity? Please advise. Call back # [phone] Ref# [id number]

## 2017-03-01 NOTE — Telephone Encounter (Signed)
Have started appeal on cover my meds for this.

## 2017-03-07 NOTE — Telephone Encounter (Signed)
Do you want me to call for peer to peer?

## 2017-03-07 NOTE — Telephone Encounter (Signed)
[employer] calling, Peer to peer review needed for PA, please call back to [phone] Appeal #[id number]

## 2017-03-08 ENCOUNTER — Encounter: Payer: Self-pay | Admitting: Family Medicine

## 2017-03-10 DIAGNOSIS — R5383 Other fatigue: Secondary | ICD-10-CM | POA: Diagnosis not present

## 2017-03-10 DIAGNOSIS — R0683 Snoring: Secondary | ICD-10-CM

## 2017-03-10 NOTE — Procedures (Signed)
    Patient Name: Stephen [name], Stephen [name] Date: 02/24/2017 Gender: Male D.O.B: April 02, 1970 Age (years): 46 Referring Provider: Briscoe Deutscher DO Height (inches): 42 Interpreting Physician: Baird Lyons MD, ABSM Weight (lbs): 285 RPSGT: Jacolyn Reedy BMI: 38 MRN: [id number] Neck Size: 16.50 <br> <br> CLINICAL INFORMATION Sleep Study Type: HST Indication for sleep study: Fatigue, Snoring  Epworth Sleepiness Score: 8  SLEEP STUDY TECHNIQUE A multi-channel overnight portable sleep study was performed. The channels recorded were: nasal airflow, thoracic respiratory movement, and oxygen saturation with a pulse oximetry. Snoring was also monitored.  MEDICATIONS Patient self administered medications include: none reported.  SLEEP ARCHITECTURE Patient was studied for 566.0 minutes. The sleep efficiency was 98.1 % and the patient was supine for 79.8%. The arousal index was 0.0 per hour.  RESPIRATORY PARAMETERS The overall AHI was 5.1 per hour, with a central apnea index of 0.2 per hour.  The oxygen nadir was 80% during sleep.  CARDIAC DATA Mean heart rate during sleep was 70.1 bpm.  IMPRESSIONS - Mild obstructive sleep apnea occurred during this study (AHI = 5.1/h). - No significant central sleep apnea occurred during this study (CAI = 0.2/h). - Oxygen desaturation was noted during this study (Min O2 = 80%). - Patient snored.  DIAGNOSIS - Obstructive Sleep Apnea (327.23 [G47.33 ICD-10])  RECOMMENDATIONS - This score is barely above the upper limit of normal (AHI 5.0/ hr) and not likely to indicate significant medical problem. Therapy should be directed at symptoms, but conservative measures including weight loss, sleep off flat of back, a chin strap or oral appliance might be appropriate. - Be careful with alcohol, sedatives and other CNS depressants that may worsen sleep apnea and disrupt normal sleep architecture. - Sleep hygiene should be reviewed to assess factors that may  improve sleep quality. - Weight management and regular exercise should be initiated or continued.  [Electronically signed] 03/10/2017 10:44 AM  Baird Lyons MD, ABSM Diplomate, American Board of Sleep Medicine   NPI: [id number]                        [place] [place] [facility]  ELECTRONICALLY SIGNED ON:  03/10/2017, 10:45 AM [place] PH: [phone]   FX: [phone] [place]

## 2017-04-02 ENCOUNTER — Telehealth: Payer: Self-pay | Admitting: Family Medicine

## 2017-04-02 NOTE — Telephone Encounter (Signed)
Called back let them know that it was declined on 02/07/17. He will close out with cover my meds.

## 2017-04-02 NOTE — Telephone Encounter (Signed)
Copied from [place]. Topic: Quick Communication - See Telephone Encounter >> Apr 02, 2017  1:57 PM Burnis Medin, NT wrote: CRM for notification. See Telephone encounter for: Stephen [name] is calling to see if  someone could call her back and  give PA for Dulaglutide (TRULICITY) 5.95 GL/8.7FI SOPN. Pls call back at [phone]  04/02/17.

## 2017-04-02 NOTE — Telephone Encounter (Signed)
See note

## 2017-07-05 NOTE — Progress Notes (Signed)
Stephen [name] is a 47 y.o. male here for an acute visit.  History of Present Illness:   Stephen [name], CMA acting as scribe for Dr. Briscoe Deutscher.   HPI: Patient in for evaluation for sore on right calf. Started little over a week ago. Had some discharge until  about two days ago. Has been keeping covered and used some antibiotic ointment for one day. No swelling. No pain with walking.   Shoulder pain: patient woke up Monday morning around 3am with left shoulder pian around shoulder blade. It does not bother much during the day only at night. No shortness of breath. Pain does radiate to top of shoulder only. Pain at night is 8/10.   PMHx, SurgHx, SocialHx, Medications, and Allergies were reviewed in the Visit Navigator and updated as appropriate.  Current Medications:   Current Outpatient Medications:  .  cyclobenzaprine (FLEXERIL) 10 MG tablet, , Disp: , Rfl:  .  hydrochlorothiazide (MICROZIDE) 12.5 MG capsule, TAKE 1 CAPSULE BY MOUTH EVERY DAY, Disp: 90 capsule, Rfl: 1 .  nabumetone (RELAFEN) 500 MG tablet, Take 500 mg by mouth as needed., Disp: , Rfl:  .  Semaglutide (OZEMPIC) 0.25 or 0.5 MG/DOSE SOPN, Inject 0.5 mg into the skin once a week., Disp: 3 pen, Rfl: 0   Allergies  Allergen Reactions  . Penicillins Nausea Only    Unknown - Childhood allergy   Review of Systems:   Pertinent items are noted in the HPI. Otherwise, ROS is negative.  Vitals:   Vitals:   07/06/17 1110  BP: 120/62  Pulse: 73  Temp: 98.4 F (36.9 C)  TempSrc: Oral  SpO2: 98%  Weight: 279 lb 3.2 oz (126.6 kg)  Height: 6\' 1"  (1.854 m)     Body mass index is 36.84 kg/m.  Physical Exam:   Physical Exam  Constitutional: He is oriented to person, place, and time. He appears well-developed and well-nourished. No distress.  HENT:  Head: Normocephalic and atraumatic.  Right Ear: External ear normal.  Left Ear: External ear normal.  Nose: Nose normal.  Mouth/Throat: Oropharynx is clear and  moist.  Eyes: Pupils are equal, round, and reactive to light. Conjunctivae and EOM are normal.  Neck: Normal range of motion. Neck supple.  Cardiovascular: Normal rate, regular rhythm, normal heart sounds and intact distal pulses.  Pulmonary/Chest: Effort normal and breath sounds normal.  Abdominal: Soft. Bowel sounds are normal.  Musculoskeletal: He exhibits edema.  Neurological: He is alert and oriented to person, place, and time.  Skin: Skin is warm and dry.  Stasis dermatitis bilaterally, small quarter-sized healing wound on right anterior shin.  Psychiatric: He has a normal mood and affect. His behavior is normal. Judgment and thought content normal.  Nursing note and vitals reviewed.   Assessment and Plan:   Stephen [name] was seen today for leg injury.  Diagnoses and all orders for this visit:  Bilateral lower extremity edema -     ECHOCARDIOGRAM COMPLETE; Future -     Ambulatory referral to Vascular Surgery  Venous stasis dermatitis of both lower extremities -     mupirocin ointment (BACTROBAN) 2 %; Place 1 application into the nose 2 (two) times daily. -     Ambulatory referral to Vascular Surgery -     doxycycline (VIBRA-TABS) 100 MG tablet; Take 1 tablet (100 mg total) by mouth 2 (two) times daily.  Neck pain -     cyclobenzaprine (FLEXERIL) 10 MG tablet; Take 1 tablet (10 mg total) by mouth as  needed for muscle spasms. -     DG Cervical Spine 2 or 3 views -     meloxicam (MOBIC) 15 MG tablet; Take 1 tablet (15 mg total) by mouth daily.  Chronic periscapular pain on left side -     DG Thoracic Spine 2 View -     meloxicam (MOBIC) 15 MG tablet; Take 1 tablet (15 mg total) by mouth daily.    . Reviewed expectations re: course of current medical issues. . Discussed self-management of symptoms. . Outlined signs and symptoms indicating need for more acute intervention. . Patient verbalized understanding and all questions were answered. Marland Kitchen Health Maintenance issues including  appropriate healthy diet, exercise, and smoking avoidance were discussed with patient. . See orders for this visit as documented in the electronic medical record. . Patient received an After Visit Summary.  CMA served as Education administrator during this visit. History, Physical, and Plan performed by medical provider. The above documentation has been reviewed and is accurate and complete. Briscoe Deutscher, D.O.  Briscoe Deutscher, DO [place] 07/15/2017

## 2017-07-06 ENCOUNTER — Ambulatory Visit (INDEPENDENT_AMBULATORY_CARE_PROVIDER_SITE_OTHER): Payer: BLUE CROSS/BLUE SHIELD

## 2017-07-06 ENCOUNTER — Ambulatory Visit: Payer: BLUE CROSS/BLUE SHIELD | Admitting: Family Medicine

## 2017-07-06 ENCOUNTER — Encounter: Payer: Self-pay | Admitting: Family Medicine

## 2017-07-06 VITALS — BP 120/62 | HR 73 | Temp 98.4°F | Ht 73.0 in | Wt 279.2 lb

## 2017-07-06 DIAGNOSIS — G8929 Other chronic pain: Secondary | ICD-10-CM

## 2017-07-06 DIAGNOSIS — M25512 Pain in left shoulder: Secondary | ICD-10-CM

## 2017-07-06 DIAGNOSIS — M542 Cervicalgia: Secondary | ICD-10-CM

## 2017-07-06 DIAGNOSIS — I872 Venous insufficiency (chronic) (peripheral): Secondary | ICD-10-CM

## 2017-07-06 DIAGNOSIS — R600 Localized edema: Secondary | ICD-10-CM

## 2017-07-06 DIAGNOSIS — M546 Pain in thoracic spine: Secondary | ICD-10-CM | POA: Diagnosis not present

## 2017-07-06 MED ORDER — DOXYCYCLINE HYCLATE 100 MG PO TABS: 100.0000 mg | ORAL_TABLET | Freq: Two times a day (BID) | ORAL | 0 refills | Status: DC

## 2017-07-06 MED ORDER — CYCLOBENZAPRINE HCL 10 MG PO TABS: 10.0000 mg | ORAL_TABLET | ORAL | 0 refills | Status: DC | PRN

## 2017-07-06 MED ORDER — MELOXICAM 15 MG PO TABS: 15.0000 mg | ORAL_TABLET | Freq: Every day | ORAL | 0 refills | Status: DC

## 2017-07-06 MED ORDER — MUPIROCIN 2 % EX OINT: 1.0000 "application " | TOPICAL_OINTMENT | Freq: Two times a day (BID) | CUTANEOUS | 0 refills | Status: DC

## 2017-07-09 ENCOUNTER — Other Ambulatory Visit: Payer: Self-pay

## 2017-07-09 DIAGNOSIS — R600 Localized edema: Secondary | ICD-10-CM

## 2017-07-09 DIAGNOSIS — I878 Other specified disorders of veins: Secondary | ICD-10-CM

## 2017-07-10 ENCOUNTER — Other Ambulatory Visit: Payer: Self-pay

## 2017-07-10 ENCOUNTER — Ambulatory Visit (HOSPITAL_COMMUNITY): Payer: BLUE CROSS/BLUE SHIELD | Attending: Family Medicine

## 2017-07-10 DIAGNOSIS — R600 Localized edema: Secondary | ICD-10-CM | POA: Insufficient documentation

## 2017-07-10 DIAGNOSIS — M25512 Pain in left shoulder: Secondary | ICD-10-CM | POA: Insufficient documentation

## 2017-07-16 ENCOUNTER — Other Ambulatory Visit: Payer: Self-pay | Admitting: Family Medicine

## 2017-07-16 DIAGNOSIS — R931 Abnormal findings on diagnostic imaging of heart and coronary circulation: Secondary | ICD-10-CM

## 2017-08-01 ENCOUNTER — Other Ambulatory Visit: Payer: Self-pay | Admitting: Family Medicine

## 2017-08-01 DIAGNOSIS — M542 Cervicalgia: Secondary | ICD-10-CM

## 2017-08-02 ENCOUNTER — Other Ambulatory Visit: Payer: Self-pay | Admitting: Family Medicine

## 2017-08-02 DIAGNOSIS — G8929 Other chronic pain: Secondary | ICD-10-CM

## 2017-08-02 DIAGNOSIS — M542 Cervicalgia: Secondary | ICD-10-CM

## 2017-08-02 DIAGNOSIS — M25512 Pain in left shoulder: Secondary | ICD-10-CM

## 2017-08-13 ENCOUNTER — Ambulatory Visit: Payer: BLUE CROSS/BLUE SHIELD | Admitting: Cardiology

## 2017-08-13 ENCOUNTER — Encounter: Payer: Self-pay | Admitting: Cardiology

## 2017-08-13 ENCOUNTER — Encounter: Payer: Self-pay | Admitting: *Deleted

## 2017-08-13 VITALS — BP 118/78 | HR 82 | Ht 73.0 in | Wt 275.8 lb

## 2017-08-13 DIAGNOSIS — R600 Localized edema: Secondary | ICD-10-CM | POA: Diagnosis not present

## 2017-08-13 DIAGNOSIS — R079 Chest pain, unspecified: Secondary | ICD-10-CM

## 2017-08-13 NOTE — Patient Instructions (Signed)
Medication Instructions:  Your physician recommends that you continue on your current medications as directed. Please refer to the Current Medication list given to you today.   Labwork: None  Testing/Procedures: You had an EKG today.   Your physician has requested that you have an exercise tolerance test. For further information please visit [email or web address]. Please also follow instruction sheet, as given.  Your physician has requested that you have a lower extremity venous duplex. This test is an ultrasound of the veins in the legs. It looks at venous blood flow that carries blood from the heart to the legs. Allow one hour for a Lower Venous exam.There are no restrictions or special instructions.   Follow-Up: Your physician wants you to follow-up in: 6 months. You will receive a reminder letter in the mail two months in advance. If you don't receive a letter, please call our office to schedule the follow-up appointment.   If you need a refill on your cardiac medications before your next appointment, please call your pharmacy.   Thank you for choosing [facility]! Robyne Peers, RN [phone]      Exercise Stress Electrocardiogram An exercise stress electrocardiogram is a test to check how blood flows to your heart. It is done to find areas of poor blood flow. You will need to walk on a treadmill for this test. The electrocardiogram will record your heartbeat when you are at rest and when you are exercising. What happens before the procedure?  Do not have drinks with caffeine or foods with caffeine for 24 hours before the test, or as told by your doctor. This includes coffee, tea (even decaf tea), sodas, chocolate, and cocoa.  Follow your doctor's instructions about eating and drinking before the test.  Ask your doctor what medicines you should or should not take before the test. Take your medicines with water unless told by your doctor not to.  If you use an  inhaler, bring it with you to the test.  Bring a snack to eat after the test.  Do not  smoke for 4 hours before the test.  Do not put lotions, powders, creams, or oils on your chest before the test.  Wear comfortable shoes and clothing. What happens during the procedure?  You will have patches put on your chest. Small areas of your chest may need to be shaved. Wires will be connected to the patches.  Your heart rate will be watched while you are resting and while you are exercising.  You will walk on the treadmill. The treadmill will slowly get faster to raise your heart rate.  The test will take about 1-2 hours. What happens after the procedure?  Your heart rate and blood pressure will be watched after the test.  You may return to your normal diet, activities, and medicines or as told by your doctor. This information is not intended to replace advice given to you by your health care provider. Make sure you discuss any questions you have with your health care provider. Document Released: 06/21/2007 Document Revised: 09/01/2015 Document Reviewed: 09/09/2012 Elsevier Interactive Patient Education  2018 [employer].    Vascular Ultrasound An ultrasound, also called sonography or ultrasonography, uses harmless sound waves to take pictures of the inside of your body. The pictures are taken with a device called a transducer that is held up against your body. The continually changing pictures can be recorded on videotape or film. A vascular ultrasound is a painless test to see if you have blood  flow problems or clots in your blood vessels. It may be done to look at blood vessels almost anywhere in the body. There are several types of ultrasounds that can be done to look at the blood vessels. They include:  Continuous wave Doppler ultrasound. This type of ultrasound uses the change in pitch of sound waves to provide information about blood flow through a blood vessel. During the test, a  health care provider listens to the sounds produced by the transducer.  Duplex ultrasound. This type of ultrasound uses standard ultrasound methods to produce a picture of a blood vessel and surrounding organs. In addition, a computer provides information about the speed and direction of blood flow through the blood vessel. With this type of ultrasound it is possible to see the structures inside the body and to evaluate blood flow within those structures at the same time.  Color Doppler ultrasound. This type of ultrasound uses standard ultrasound methods to produce a picture of a blood vessel. In addition, a computer converts the Doppler sounds into colors that are overlaid on the picture of the blood vessel. These colors represent the speed and direction of blood flow through the vessel.  Power Doppler ultrasound. This type of ultrasound is up to five times more sensitive than color Doppler ultrasound. Power Doppler ultrasound can also get pictures that are difficult or impossible to get using standard color Doppler ultrasound. Power Doppler ultrasound is most commonly used to evaluate blood flow through vessels within organs, such as the liver or kidneys.  Transcranial Doppler ultrasound. This type of ultrasound looks at blood flow in blood vessels throughout the brain. It can reveal the presence of narrow arteries, clots blocking the vessels, or malformed blood vessels.  What are the risks? There are no known risks or complications of having an ultrasound. What happens before the procedure?  If the ultrasound scan involves your upper abdomen, you may be directed not to eat, smoke, or chew gum the morning of your exam. Follow your health care provider's instructions.  During the test, a gel will be applied to your skin. Wear clothing that is easily washable in case the gel gets on your clothes. What happens during the procedure?  A gel will be applied to your skin. It may feel cool.  The  transducer will be placed on the area to be examined.  Pictures will be taken. They will be displayed on one or more monitors that look like small television screens. What happens after the procedure?  You can safely drive home and return to regular activities immediately after your exam.  Keep follow-up visits as directed by your health care provider.  Ask when your test results will be ready. It is your responsibility to get your test results. This information is not intended to replace advice given to you by your health care provider. Make sure you discuss any questions you have with your health care provider. Document Released: 01/14/2004 Document Revised: 06/10/2015 Document Reviewed: 03/27/2013 Elsevier Interactive Patient Education  [employer].

## 2017-08-13 NOTE — Progress Notes (Signed)
Cardiology Office Note:    Date:  08/13/2017   ID:  Stephen [name], DOB 1970/04/29, MRN [id number]  PCP:  Briscoe Deutscher, DO  Cardiologist:  Jenean [place], MD   Referring MD: Briscoe Deutscher, DO    ASSESSMENT:    1. Bilateral lower extremity edema   2. Chest pain, unspecified type    PLAN:    In order of problems listed above:  1. Primary prevention stressed with the patient.  Importance of compliance with diet and medications stressed and he vocalized understanding.  His blood pressure is stable.  His electrolytes are followed by his primary care physician.  I told him the importance of potassium supplementation in diet especially because he is on a low-dose diuretic and he vocalized understanding. 2. I think reviewed echocardiographic findings with him and they are largely unremarkable.  He has aorta which has mildly enlarged and we will keep a track of it. 3. His blood pressure is stable.  In view of his bilateral pedal edema we will obtain a bilateral DVT study to rule out deep vein thrombosis. 4. His chest discomfort is atypical and we will order plain treadmill stress test to assess this. 5. Patient will be seen in follow-up appointment in 6 months or earlier if the patient has any concerns    Medication Adjustments/Labs and Tests Ordered: Current medicines are reviewed at length with the patient today.  Concerns regarding medicines are outlined above.  No orders of the defined types were placed in this encounter.  No orders of the defined types were placed in this encounter.    History of Present Illness:    Stephen [name] is a 47 y.o. male who is being seen today for the evaluation of abnormal echocardiogram and bilateral pedal edema at the request of Briscoe Deutscher, DO.  Patient is a pleasant 47 year old male.  He has no significant past medical history.  He takes hydrochlorothiazide for his pedal edema.  He mentions to me that he had an echocardiogram which was  abnormal and therefore he was referred here.  Again he leads a sedentary lifestyle.  He occasionally, complains of chest discomfort not related to exertion.  No orthopnea or PND.  Patient denies any history of diabetes mellitus or dyslipidemia.  He has chronic bilateral pedal edema.    Past Medical History:  Diagnosis Date  . Degenerative disc disease, lumbar     Past Surgical History:  Procedure Laterality Date  . APPENDECTOMY    . EYE MUSCLE SURGERY      Current Medications: Current Meds  Medication Sig  . cyclobenzaprine (FLEXERIL) 10 MG tablet TAKE 1 TABLET BY MOUTH AS NEEDED FOR MUSCLE SPASMS.  . hydrochlorothiazide (MICROZIDE) 12.5 MG capsule TAKE 1 CAPSULE BY MOUTH EVERY DAY  . meloxicam (MOBIC) 15 MG tablet TAKE 1 TABLET BY MOUTH EVERY DAY  . mupirocin ointment (BACTROBAN) 2 % Place 1 application into the nose 2 (two) times daily.     Allergies:   Penicillins   Social History   Socioeconomic History  . Marital status: Married    Spouse name: Not on file  . Number of children: Not on file  . Years of education: Not on file  . Highest education level: Not on file  Occupational History  . Not on file  Social Needs  . Financial resource strain: Not on file  . Food insecurity:    Worry: Not on file    Inability: Not on file  . Transportation needs:  Medical: Not on file    Non-medical: Not on file  Tobacco Use  . Smoking status: Never Smoker  . Smokeless tobacco: Never Used  Substance and Sexual Activity  . Alcohol use: Yes    Comment: Socially  . Drug use: No  . Sexual activity: Yes    Partners: Female  Lifestyle  . Physical activity:    Days per week: Not on file    Minutes per session: Not on file  . Stress: Not on file  Relationships  . Social connections:    Talks on phone: Not on file    Gets together: Not on file    Attends religious service: Not on file    Active member of club or organization: Not on file    Attends meetings of clubs or  organizations: Not on file    Relationship status: Not on file  Other Topics Concern  . Not on file  Social History Narrative  . Not on file     Family History: The patient's family history includes Hypertension in his mother.  ROS:   Please see the history of present illness.    All other systems reviewed and are negative.  EKGs/Labs/Other Studies Reviewed:    The following studies were reviewed today: EKG reveals sinus rhythm and nonspecific ST-T changes.   Recent Labs: 02/02/2017: ALT 11; BUN 15; Creatinine, Ser 1.22; Potassium 4.2; Pro B Natriuretic peptide (BNP) 24.0; Sodium 140  Recent Lipid Panel    Component Value Date/Time   CHOL 168 12/21/2015   TRIG 91 12/21/2015   HDL 43 12/21/2015   LDLCALC 107 12/21/2015    Physical Exam:    VS:  BP 118/78   Pulse 82   Ht 6\' 1"  (1.854 m)   Wt 275 lb 12.8 oz (125.1 kg)   SpO2 97%   BMI 36.39 kg/m     Wt Readings from Last 3 Encounters:  08/13/17 275 lb 12.8 oz (125.1 kg)  07/06/17 279 lb 3.2 oz (126.6 kg)  02/02/17 288 lb 3.2 oz (130.7 kg)     GEN: Patient is in no acute distress HEENT: Normal NECK: No JVD; No carotid bruits LYMPHATICS: No lymphadenopathy CARDIAC: S1 S2 regular, 2/6 systolic murmur at the apex. RESPIRATORY:  Clear to auscultation without rales, wheezing or rhonchi  ABDOMEN: Soft, non-tender, non-distended MUSCULOSKELETAL: Bilateral pedal 2+ edema; No deformity  SKIN: Warm and dry NEUROLOGIC:  Alert and oriented x 3 PSYCHIATRIC:  Normal affect    Signed, Jenean Lindau, MD  08/13/2017 11:47 AM    [place]

## 2017-08-14 ENCOUNTER — Telehealth (HOSPITAL_COMMUNITY): Payer: Self-pay

## 2017-08-14 NOTE — Telephone Encounter (Signed)
Encounter complete. 

## 2017-08-15 ENCOUNTER — Ambulatory Visit (HOSPITAL_COMMUNITY)
Admission: RE | Admit: 2017-08-15 | Discharge: 2017-08-15 | Disposition: A | Discharge status: Home / Self Care | Payer: BLUE CROSS/BLUE SHIELD | Source: Ambulatory Visit | Attending: Cardiovascular Disease | Admitting: Cardiovascular Disease

## 2017-08-15 DIAGNOSIS — R079 Chest pain, unspecified: Secondary | ICD-10-CM | POA: Diagnosis not present

## 2017-08-15 DIAGNOSIS — R600 Localized edema: Secondary | ICD-10-CM | POA: Diagnosis not present

## 2017-08-15 LAB — EXERCISE TOLERANCE TEST
CSEPED: 9 min
CSEPEDS: 10 s
CSEPHR: 103 %
Estimated workload: 10.4 METS
MPHR: 175 {beats}/min
Peak HR: 181 {beats}/min
RPE: 19
Rest HR: 70 {beats}/min

## 2017-08-20 ENCOUNTER — Encounter: Payer: Self-pay | Admitting: Cardiology

## 2017-08-21 ENCOUNTER — Encounter (INDEPENDENT_AMBULATORY_CARE_PROVIDER_SITE_OTHER): Payer: Self-pay

## 2017-08-22 ENCOUNTER — Telehealth: Payer: Self-pay | Admitting: *Deleted

## 2017-08-22 NOTE — Telephone Encounter (Signed)
Called patient to clarify about MyChart message regarding EKG. Patient just wanted to know what his EKG showed. Advised patient that his EKG was normal. Patient verbalized understanding. No further questions.

## 2017-09-11 ENCOUNTER — Other Ambulatory Visit: Payer: Self-pay

## 2017-09-11 ENCOUNTER — Ambulatory Visit: Payer: BLUE CROSS/BLUE SHIELD | Admitting: Vascular Surgery

## 2017-09-11 ENCOUNTER — Encounter: Payer: Self-pay | Admitting: Vascular Surgery

## 2017-09-11 ENCOUNTER — Ambulatory Visit (HOSPITAL_COMMUNITY)
Admission: RE | Admit: 2017-09-11 | Discharge: 2017-09-11 | Disposition: A | Discharge status: Home / Self Care | Payer: BLUE CROSS/BLUE SHIELD | Source: Ambulatory Visit | Attending: Vascular Surgery | Admitting: Vascular Surgery

## 2017-09-11 VITALS — BP 113/81 | HR 66 | Temp 98.6°F | Resp 20 | Ht 73.0 in | Wt 280.0 lb

## 2017-09-11 DIAGNOSIS — I878 Other specified disorders of veins: Secondary | ICD-10-CM

## 2017-09-11 DIAGNOSIS — R600 Localized edema: Secondary | ICD-10-CM

## 2017-09-11 DIAGNOSIS — R590 Localized enlarged lymph nodes: Secondary | ICD-10-CM | POA: Insufficient documentation

## 2017-09-11 NOTE — Progress Notes (Signed)
[facility] [place]  Patient name: Stephen [name] MRN: [id number] DOB: 1970/10/19 Sex: male  REASON FOR CONSULT: Evaluation bilateral lower swelling  HPI: Stephen [name] is a 47 y.o. male, who is in today for discussion of bilateral lower extremity edema and venous stasis changes.  He has a history of what sounds like a great saphenous vein ablation at [facility] proximally 7 years ago.  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  He has had progressive changes of swelling, itching and thickening and discoloration of both lower extremities from his calves down to his ankles.  He does not have swelling onto the dorsum of his foot or onto his toes.  He does not have any history of DVT.  He has tried compression in the past.  He does report that his swelling is minimal when he first arises in the morning and is progressive throughout the day.  He has had some excoriation and slow healing of facial abrasions but no large venous ulcers.  Past Medical History:  Diagnosis Date  . Degenerative disc disease, lumbar     Family History  Problem Relation Age of Onset  . Hypertension Mother     SOCIAL HISTORY: Social History   Socioeconomic History  . Marital status: Married    Spouse name: Not on file  . Number of children: Not on file  . Years of education: Not on file  . Highest education level: Not on file  Occupational History  . Not on file  Social Needs  . Financial resource strain: Not on file  . Food insecurity:    Worry: Not on file    Inability: Not on file  . Transportation needs:    Medical: Not on file    Non-medical: Not on file  Tobacco Use  . Smoking status: Never Smoker  . Smokeless tobacco: Never Used  Substance and Sexual Activity  . Alcohol use: Yes    Comment: Socially  . Drug use: No  . Sexual activity: Yes    Partners: Female  Lifestyle  . Physical activity:    Days per  week: Not on file    Minutes per session: Not on file  . Stress: Not on file  Relationships  . Social connections:    Talks on phone: Not on file    Gets together: Not on file    Attends religious service: Not on file    Active member of club or organization: Not on file    Attends meetings of clubs or organizations: Not on file    Relationship status: Not on file  . Intimate partner violence:    Fear of current or ex partner: Not on file    Emotionally abused: Not on file    Physically abused: Not on file    Forced sexual activity: Not on file  Other Topics Concern  . Not on file  Social History Narrative  . Not on file    Allergies  Allergen Reactions  . Penicillins Nausea Only    Unknown - Childhood allergy    Current Outpatient Medications  Medication Sig Dispense Refill  . cyclobenzaprine (FLEXERIL) 10 MG tablet TAKE 1 TABLET BY MOUTH AS NEEDED FOR MUSCLE SPASMS. 30 tablet 0  . hydrochlorothiazide (MICROZIDE) 12.5 MG capsule TAKE 1 CAPSULE BY MOUTH EVERY DAY 90 capsule 1  . meloxicam (MOBIC) 15 MG tablet TAKE 1 TABLET BY MOUTH EVERY DAY 30 tablet 0  . mupirocin ointment (BACTROBAN) 2 %  Place 1 application into the nose 2 (two) times daily. 22 g 0   No current facility-administered medications for this visit.     REVIEW OF SYSTEMS:  [X]  denotes positive finding, [ ]  denotes negative finding Cardiac  Comments:  Chest pain or chest pressure:    Shortness of breath upon exertion:    Short of breath when lying flat:    Irregular heart rhythm:        Vascular    Pain in calf, thigh, or hip brought on by ambulation:    Pain in feet at night that wakes you up from your sleep:     Blood clot in your veins:    Leg swelling:  x       Pulmonary    Oxygen at home:    Productive cough:     Wheezing:         Neurologic    Sudden weakness in arms or legs:     Sudden numbness in arms or legs:     Sudden onset of difficulty speaking or slurred speech:    Temporary loss  of vision in one eye:     Problems with dizziness:         Gastrointestinal    Blood in stool:     Vomited blood:         Genitourinary    Burning when urinating:     Blood in urine:        Psychiatric    Major depression:         Hematologic    Bleeding problems:    Problems with blood clotting too easily:        Skin    Rashes or ulcers:        Constitutional    Fever or chills:      PHYSICAL EXAM: Vitals:   09/11/17 1329  BP: 113/81  Pulse: 66  Resp: 20  Temp: 98.6 F (37 C)  TempSrc: Oral  SpO2: 99%  Weight: 280 lb (127 kg)  Height: 6\' 1"  (1.854 m)    GENERAL: The patient is a well-nourished male, in no acute distress. The vital signs are documented above. CARDIOVASCULAR: 2+ radial and 2+ dorsalis pedis pulses bilaterally PULMONARY: There is good air exchange  ABDOMEN: Soft and non-tender  MUSCULOSKELETAL: There are no major deformities or cyanosis. NEUROLOGIC: No focal weakness or paresthesias are detected. SKIN: Marked changes of venous hypertension with circumferential hemosiderin deposits and skin thickening both lower extremities from mid calf down to his ankle.  Somewhat worse on his right than his left leg. PSYCHIATRIC: The patient has a normal affect.  DATA:  He had gone a rule out DVT study at [place] [facility] on 08/15/2017.  This was negative.  He underwent reflux study in our office today.  This shows nonvisualization of his great saphenous vein from the knee to just below the saphenofemoral junction suggesting prior ablation.  He has mild reflux in the small saphenous vein with no dilatation on the right.  He has no evidence of DVT or venous obstruction.  Does have significant deep venous reflux bilaterally  MEDICAL ISSUES: Had a long discussion with the patient.  I explained that this is a chronic condition with a chronic venous hypertension.  Explained the only options are conservative treatment with elevation with his legs higher than his heart whenever  possible and strict use of knee-high 20 to 30 mmHg graduated compression garments.  I explained that this will be  continually progressive and that he can slow the progression with these conservative means.  He has had some excoriation in the past and feel that he is at very high risk for venous stasis disease ulcerations I explained this is not limb threatening.  He was reassured with this discussion will see [place] again on an as-needed basis   Rosetta Posner, MD [facility] Tel [phone] Pager [phone]

## 2017-12-18 DIAGNOSIS — D3121 Benign neoplasm of right retina: Secondary | ICD-10-CM | POA: Diagnosis not present

## 2018-03-21 ENCOUNTER — Encounter: Payer: Self-pay | Admitting: Cardiology

## 2018-03-21 ENCOUNTER — Ambulatory Visit: Payer: BLUE CROSS/BLUE SHIELD | Admitting: Cardiology

## 2018-03-21 VITALS — BP 120/76 | HR 83 | Ht 73.0 in | Wt 286.0 lb

## 2018-03-21 DIAGNOSIS — E663 Overweight: Secondary | ICD-10-CM | POA: Diagnosis not present

## 2018-03-21 DIAGNOSIS — R600 Localized edema: Secondary | ICD-10-CM | POA: Diagnosis not present

## 2018-03-21 DIAGNOSIS — G4733 Obstructive sleep apnea (adult) (pediatric): Secondary | ICD-10-CM | POA: Diagnosis not present

## 2018-03-21 NOTE — Patient Instructions (Signed)
Medication Instructions:  Your physician recommends that you continue on your current medications as directed. Please refer to the Current Medication list given to you today.  If you need a refill on your cardiac medications before your next appointment, please call your pharmacy.   Lab work: None  If you have labs (blood work) drawn today and your tests are completely normal, you will receive your results only by: Marland Kitchen MyChart Message (if you have MyChart) OR . A paper copy in the mail If you have any lab test that is abnormal or we need to change your treatment, we will call you to review the results.  Testing/Procedures: None  Follow-Up: At [facility], you and your health needs are our priority.  As part of our continuing mission to provide you with exceptional heart care, we have created designated Provider Care Teams.  These Care Teams include your primary Cardiologist (physician) and Advanced Practice Providers (APPs -  Physician Assistants and Nurse Practitioners) who all work together to provide you with the care you need, when you need it. You will need a follow up appointment in 1 years.  Please call our office 2 months in advance to schedule this appointment.  You may see No primary care provider on file. or another member of our [employer] Provider Team in [place]: Jenne Campus, MD . Shirlee More, MD  Any Other Special Instructions Will Be Listed Below (If Applicable).

## 2018-03-21 NOTE — Progress Notes (Signed)
Cardiology Office Note:    Date:  03/21/2018   ID:  Cambridge Deleo, DOB 10/15/1970, MRN [id number]  PCP:  Briscoe Deutscher, DO  Cardiologist:  Jenean [place], MD   Referring MD: Briscoe Deutscher, DO    ASSESSMENT:    1. Bilateral lower extremity edema   2. OSA (obstructive sleep apnea)   3. Overweight    PLAN:    In order of problems listed above:  1. Primary prevention stressed with the patient.  Importance of compliance with diet and medication stressed and he vocalized understanding.  His blood pressure is stable.  Diet was discussed for obesity and risks of obesity explained.  His palpitations are resolved and he is happy with it.  He will be seen in follow-up appointment in an annual basis or earlier if he has any concerns.   Medication Adjustments/Labs and Tests Ordered: Current medicines are reviewed at length with the patient today.  Concerns regarding medicines are outlined above.  No orders of the defined types were placed in this encounter.  No orders of the defined types were placed in this encounter.    No chief complaint on file.    History of Present Illness:    Stephen [name] is a 48 y.o. male patient was evaluated in the past for chest discomfort and palpitations.  Test was unremarkable and he orthopnea or PND.  He leads a sedentary lifestyle.  At the time of my evaluation, the patient is alert awake oriented and in no distress.  Past Medical History:  Diagnosis Date  . Degenerative disc disease, lumbar     Past Surgical History:  Procedure Laterality Date  . APPENDECTOMY    . EYE MUSCLE SURGERY      Current Medications: Current Meds  Medication Sig  . cyclobenzaprine (FLEXERIL) 10 MG tablet TAKE 1 TABLET BY MOUTH AS NEEDED FOR MUSCLE SPASMS.  . hydrochlorothiazide (MICROZIDE) 12.5 MG capsule TAKE 1 CAPSULE BY MOUTH EVERY DAY (Patient taking differently: Take 12.5 mg by mouth as needed. TAKE 1 CAPSULE BY MOUTH EVERY DAY)  . meloxicam (MOBIC) 15 MG  tablet TAKE 1 TABLET BY MOUTH EVERY DAY (Patient taking differently: Take 15 mg by mouth as needed. )     Allergies:   Penicillins   Social History   Socioeconomic History  . Marital status: Married    Spouse name: Not on file  . Number of children: Not on file  . Years of education: Not on file  . Highest education level: Not on file  Occupational History  . Not on file  Social Needs  . Financial resource strain: Not on file  . Food insecurity:    Worry: Not on file    Inability: Not on file  . Transportation needs:    Medical: Not on file    Non-medical: Not on file  Tobacco Use  . Smoking status: Never Smoker  . Smokeless tobacco: Never Used  Substance and Sexual Activity  . Alcohol use: Yes    Comment: Socially  . Drug use: No  . Sexual activity: Yes    Partners: Female  Lifestyle  . Physical activity:    Days per week: Not on file    Minutes per session: Not on file  . Stress: Not on file  Relationships  . Social connections:    Talks on phone: Not on file    Gets together: Not on file    Attends religious service: Not on file    Active member of  club or organization: Not on file    Attends meetings of clubs or organizations: Not on file    Relationship status: Not on file  Other Topics Concern  . Not on file  Social History Narrative  . Not on file     Family History: The patient's family history includes Hypertension in his mother.  ROS:   Please see the history of present illness.    All other systems reviewed and are negative.  EKGs/Labs/Other Studies Reviewed:    The following studies were reviewed today: I discussed my findings with the patient at extensive length.   Recent Labs: No results found for requested labs within last 8760 hours.  Recent Lipid Panel    Component Value Date/Time   CHOL 168 12/21/2015   TRIG 91 12/21/2015   HDL 43 12/21/2015   LDLCALC 107 12/21/2015    Physical Exam:    VS:  BP 120/76 (BP Location: Right  Arm, Patient Position: Sitting, Cuff Size: Normal)   Pulse 83   Ht 6\' 1"  (1.854 m)   Wt 286 lb (129.7 kg)   SpO2 98%   BMI 37.73 kg/m     Wt Readings from Last 3 Encounters:  03/21/18 286 lb (129.7 kg)  09/11/17 280 lb (127 kg)  08/13/17 275 lb 12.8 oz (125.1 kg)     GEN: Patient is in no acute distress HEENT: Normal NECK: No JVD; No carotid bruits LYMPHATICS: No lymphadenopathy CARDIAC: Hear sounds regular, 2/6 systolic murmur at the apex. RESPIRATORY:  Clear to auscultation without rales, wheezing or rhonchi  ABDOMEN: Soft, non-tender, non-distended MUSCULOSKELETAL:  No edema; No deformity  SKIN: Warm and dry NEUROLOGIC:  Alert and oriented x 3 PSYCHIATRIC:  Normal affect   Signed, Jenean [place], MD  03/21/2018 11:47 AM    [place]

## 2018-08-29 ENCOUNTER — Other Ambulatory Visit: Payer: Self-pay

## 2018-08-29 DIAGNOSIS — Z1283 Encounter for screening for malignant neoplasm of skin: Secondary | ICD-10-CM

## 2018-09-13 ENCOUNTER — Other Ambulatory Visit: Payer: Self-pay

## 2018-09-13 ENCOUNTER — Encounter: Payer: Self-pay | Admitting: Family Medicine

## 2018-09-13 ENCOUNTER — Ambulatory Visit: Payer: BC Managed Care – PPO | Admitting: Family Medicine

## 2018-09-13 VITALS — BP 123/85 | HR 63 | Temp 97.7°F | Ht 73.0 in | Wt 279.2 lb

## 2018-09-13 DIAGNOSIS — Z13220 Encounter for screening for lipoid disorders: Secondary | ICD-10-CM

## 2018-09-13 DIAGNOSIS — R7303 Prediabetes: Secondary | ICD-10-CM

## 2018-09-13 DIAGNOSIS — Z125 Encounter for screening for malignant neoplasm of prostate: Secondary | ICD-10-CM | POA: Diagnosis not present

## 2018-09-13 DIAGNOSIS — Z23 Encounter for immunization: Secondary | ICD-10-CM

## 2018-09-13 DIAGNOSIS — M545 Low back pain, unspecified: Secondary | ICD-10-CM | POA: Insufficient documentation

## 2018-09-13 DIAGNOSIS — R600 Localized edema: Secondary | ICD-10-CM

## 2018-09-13 DIAGNOSIS — Z0001 Encounter for general adult medical examination with abnormal findings: Secondary | ICD-10-CM | POA: Diagnosis not present

## 2018-09-13 DIAGNOSIS — I872 Venous insufficiency (chronic) (peripheral): Secondary | ICD-10-CM

## 2018-09-13 LAB — LIPID PANEL
Cholesterol: 139 mg/dL (ref 0–200)
HDL: 42.4 mg/dL (ref >39.00)
LDL Cholesterol: 77 mg/dL (ref 0–99)
NonHDL: 96.26
Total CHOL/HDL Ratio: 3
Triglycerides: 95 mg/dL (ref 0.0–149.0)
VLDL: 19 mg/dL (ref 0.0–40.0)

## 2018-09-13 LAB — HEMOGLOBIN A1C: Hgb A1c MFr Bld: 5.3 % (ref 4.6–6.5)

## 2018-09-13 LAB — COMPREHENSIVE METABOLIC PANEL
ALT: 12 U/L (ref 0–53)
AST: 12 U/L (ref 0–37)
Albumin: 4.2 g/dL (ref 3.5–5.2)
Alkaline Phosphatase: 69 U/L (ref 39–117)
BUN: 15 mg/dL (ref 6–23)
CO2: 29 mEq/L (ref 19–32)
Calcium: 9.1 mg/dL (ref 8.4–10.5)
Chloride: 101 mEq/L (ref 96–112)
Creatinine, Ser: 1.21 mg/dL (ref 0.40–1.50)
GFR: 64.41 mL/min (ref >60.00)
Glucose, Bld: 82 mg/dL (ref 70–99)
Potassium: 4.2 mEq/L (ref 3.5–5.1)
Sodium: 137 mEq/L (ref 135–145)
Total Bilirubin: 0.8 mg/dL (ref 0.2–1.2)
Total Protein: 7.1 g/dL (ref 6.0–8.3)

## 2018-09-13 LAB — CBC
HCT: 44.8 % (ref 39.0–52.0)
Hemoglobin: 15.4 g/dL (ref 13.0–17.0)
MCHC: 34.3 g/dL (ref 30.0–36.0)
MCV: 93.7 fl (ref 78.0–100.0)
Platelets: 213 10*3/uL (ref 150.0–400.0)
RBC: 4.78 Mil/uL (ref 4.22–5.81)
RDW: 12.9 % (ref 11.5–15.5)
WBC: 4.7 10*3/uL (ref 4.0–10.5)

## 2018-09-13 LAB — PSA: PSA: 2.29 ng/mL (ref 0.10–4.00)

## 2018-09-13 LAB — TSH: TSH: 1.84 u[IU]/mL (ref 0.35–4.50)

## 2018-09-13 MED ORDER — TRIAMCINOLONE ACETONIDE 0.1 % EX CREA: 1.0000 "application " | TOPICAL_CREAM | Freq: Two times a day (BID) | CUTANEOUS | 0 refills | Status: DC

## 2018-09-13 NOTE — Progress Notes (Signed)
Please inform patient of the following:  Blood work is all NORMAL. Would like for him to keep up the good work and we can recheck in a year or so.  Algis Greenhouse. Jerline Pain, MD 09/13/2018 4:40 PM

## 2018-09-13 NOTE — Assessment & Plan Note (Signed)
Continue HCTZ as needed.

## 2018-09-13 NOTE — Assessment & Plan Note (Signed)
Start triamcinolone as needed for itching and irritation.  He will follow up with dermatology soon.

## 2018-09-13 NOTE — Progress Notes (Signed)
Chief Complaint:  Stephen [name] is a 48 y.o. male who presents today for his annual comprehensive physical exam.    Assessment/Plan:  Prediabetes Check A1c.   Recurrent low back pain Stable. No red flags. Continue flexeril and mobic as needed.   Bilateral lower extremity edema Continue HCTZ as needed.   Stasis dermatitis Start triamcinolone as needed for itching and irritation.  He will follow up with dermatology soon.   Body mass index is 36.84 kg/m. / Morbid Obesity BMI Metric Follow Up - 09/13/18 1039      BMI Metric Follow Up-Please document annually   BMI Metric Follow Up  Education provided        Preventative Healthcare: Flu vaccine given today. Check CBC, CMET, TSH, PSA, and lipid panel.   Patient Counseling(The following topics were reviewed and/or handout was given):  -Nutrition: Stressed importance of moderation in sodium/caffeine intake, saturated fat and cholesterol, caloric balance, sufficient intake of fresh fruits, vegetables, and fiber.  -Stressed the importance of regular exercise.   -Substance Abuse: Discussed cessation/primary prevention of tobacco, alcohol, or other drug use; driving or other dangerous activities under the influence; availability of treatment for abuse.   -Injury prevention: Discussed safety belts, safety helmets, smoke detector, smoking near bedding or upholstery.   -Sexuality: Discussed sexually transmitted diseases, partner selection, use of condoms, avoidance of unintended pregnancy and contraceptive alternatives.   -Dental health: Discussed importance of regular tooth brushing, flossing, and dental visits.  -Health maintenance and immunizations reviewed. Please refer to Health maintenance section.  Return to care in 1 year for next preventative visit.     Subjective:  HPI:  He has had a rash on his lower extremities for the past few days. No obvious precipitating events. No treatments tried. No other obvious alleviating or  aggravating factors. Rash is very itchy.   His stable, chronic medical conditions are outlined below:   # Leg Edema - Uses HCTZ 12.5mg  as needed  # Recurrent Low Back Pain - Uses flexeril and mobic as needed  % OSA - not on CPAP  Lifestyle Diet: Tries to eat a healthy and balanced diet.  Exercise: Tries to walk on the weekend.   Depression screen [facility] 2/9 09/13/2018  Decreased Interest 1  Down, Depressed, Hopeless 0  PHQ - 2 Score 1  Altered sleeping 0  Tired, decreased energy 1  Change in appetite 0  Feeling bad or failure about yourself  0  Trouble concentrating 0  Moving slowly or fidgety/restless 0  Suicidal thoughts 0  PHQ-9 Score 2  Difficult doing work/chores Not difficult at all    Health Maintenance Due  Topic Date Due  . INFLUENZA VACCINE  08/17/2018     ROS: Per HPI, otherwise a complete review of systems was negative.   PMH:  The following were reviewed and entered/updated in epic: Past Medical History:  Diagnosis Date  . Basal cell carcinoma   . Degenerative disc disease, lumbar    Patient Active Problem List   Diagnosis Date Noted  . Recurrent low back pain 09/13/2018  . Stasis dermatitis 09/13/2018  . Prediabetes 02/03/2017  . Bilateral lower extremity edema 02/02/2017  . OSA (obstructive sleep apnea) 02/02/2017   Past Surgical History:  Procedure Laterality Date  . APPENDECTOMY    . EYE MUSCLE SURGERY      Family History  Problem Relation Age of Onset  . Hypertension Mother     Medications- reviewed and updated Current Outpatient Medications  Medication Sig Dispense  Refill  . cyclobenzaprine (FLEXERIL) 10 MG tablet TAKE 1 TABLET BY MOUTH AS NEEDED FOR MUSCLE SPASMS. 30 tablet 0  . meloxicam (MOBIC) 15 MG tablet TAKE 1 TABLET BY MOUTH EVERY DAY (Patient taking differently: Take 15 mg by mouth as needed. ) 30 tablet 0  . hydrochlorothiazide (MICROZIDE) 12.5 MG capsule TAKE 1 CAPSULE BY MOUTH EVERY DAY (Patient not taking: Reported on  09/13/2018) 90 capsule 1  . triamcinolone cream (KENALOG) 0.1 % Apply 1 application topically 2 (two) times daily. 30 g 0   No current facility-administered medications for this visit.     Allergies-reviewed and updated Allergies  Allergen Reactions  . Penicillins Nausea Only    Unknown - Childhood allergy    Social History   Socioeconomic History  . Marital status: Married    Spouse name: Not on file  . Number of children: Not on file  . Years of education: Not on file  . Highest education level: Not on file  Occupational History  . Not on file  Social Needs  . Financial resource strain: Not on file  . Food insecurity    Worry: Not on file    Inability: Not on file  . Transportation needs    Medical: Not on file    Non-medical: Not on file  Tobacco Use  . Smoking status: Never Smoker  . Smokeless tobacco: Never Used  Substance and Sexual Activity  . Alcohol use: Yes    Comment: Socially  . Drug use: No  . Sexual activity: Yes    Partners: Female  Lifestyle  . Physical activity    Days per week: Not on file    Minutes per session: Not on file  . Stress: Not on file  Relationships  . Social Herbalist on phone: Not on file    Gets together: Not on file    Attends religious service: Not on file    Active member of club or organization: Not on file    Attends meetings of clubs or organizations: Not on file    Relationship status: Not on file  Other Topics Concern  . Not on file  Social History Narrative  . Not on file        Objective:  Physical Exam: BP 123/85   Pulse 63   Temp 97.7 F (36.5 C)   Ht 6\' 1"  (1.854 m)   Wt 279 lb 4 oz (126.7 kg)   SpO2 98%   BMI 36.84 kg/m   Body mass index is 36.84 kg/m. Wt Readings from Last 3 Encounters:  09/13/18 279 lb 4 oz (126.7 kg)  03/21/18 286 lb (129.7 kg)  09/11/17 280 lb (127 kg)  Gen: NAD, resting comfortably HEENT: TMs normal bilaterally. OP clear. No thyromegaly noted.  CV: RRR with no  murmurs appreciated Pulm: NWOB, CTAB with no crackles, wheezes, or rhonchi GI: Normal bowel sounds present. Soft, Nontender, Nondistended. MSK: no edema, cyanosis, or clubbing noted Skin: warm, dry. Petechial rash with dry skin and erythema on bilateral lower extremities.  Neuro: CN2-12 grossly intact. Strength 5/5 in upper and lower extremities. Reflexes symmetric and intact bilaterally.  Psych: Normal affect and thought content      M. Jerline Pain, MD 09/13/2018 10:46 AM

## 2018-09-13 NOTE — Assessment & Plan Note (Signed)
Check A1c. 

## 2018-09-13 NOTE — Patient Instructions (Signed)
It was very nice to see you today!  Please use the triamcinolone for your rash.  We will give you your flu vaccine today and check blood work.  Come back in 1 year for you r next physical, or sooner if needed.   Take care, Dr Jerline Pain  Please try these tips to maintain a healthy lifestyle:   Eat at least 3 REAL meals and 1-2 snacks per day.  Aim for no more than 5 hours between eating.  If you eat breakfast, please do so within one hour of getting up.    Obtain twice as many fruits/vegetables as protein or carbohydrate foods for both lunch and dinner. (Half of each meal should be fruits/vegetables, one quarter protein, and one quarter starchy carbs)   Cut down on sweet beverages. This includes juice, soda, and sweet tea.    Exercise at least 150 minutes every week.    Preventive Care 29-46 Years Old, Male Preventive care refers to lifestyle choices and visits with your health care provider that can promote health and wellness. This includes:  A yearly physical exam. This is also called an annual well check.  Regular dental and eye exams.  Immunizations.  Screening for certain conditions.  Healthy lifestyle choices, such as eating a healthy diet, getting regular exercise, not using drugs or products that contain nicotine and tobacco, and limiting alcohol use. What can I expect for my preventive care visit? Physical exam Your health care provider will check:  Height and weight. These may be used to calculate body mass index (BMI), which is a measurement that tells if you are at a healthy weight.  Heart rate and blood pressure.  Your skin for abnormal spots. Counseling Your health care provider may ask you questions about:  Alcohol, tobacco, and drug use.  Emotional well-being.  Home and relationship well-being.  Sexual activity.  Eating habits.  Work and work Statistician. What immunizations do I need?  Influenza (flu) vaccine  This is recommended every  year. Tetanus, diphtheria, and pertussis (Tdap) vaccine  You may need a Td booster every 10 years. Varicella (chickenpox) vaccine  You may need this vaccine if you have not already been vaccinated. Zoster (shingles) vaccine  You may need this after age 21. Measles, mumps, and rubella (MMR) vaccine  You may need at least one dose of MMR if you were born in 1957 or later. You may also need a second dose. Pneumococcal conjugate (PCV13) vaccine  You may need this if you have certain conditions and were not previously vaccinated. Pneumococcal polysaccharide (PPSV23) vaccine  You may need one or two doses if you smoke cigarettes or if you have certain conditions. Meningococcal conjugate (MenACWY) vaccine  You may need this if you have certain conditions. Hepatitis A vaccine  You may need this if you have certain conditions or if you travel or work in places where you may be exposed to hepatitis A. Hepatitis B vaccine  You may need this if you have certain conditions or if you travel or work in places where you may be exposed to hepatitis B. Haemophilus influenzae type b (Hib) vaccine  You may need this if you have certain risk factors. Human papillomavirus (HPV) vaccine  If recommended by your health care provider, you may need three doses over 6 months. You may receive vaccines as individual doses or as more than one vaccine together in one shot (combination vaccines). Talk with your health care provider about the risks and benefits of combination  vaccines. What tests do I need? Blood tests  Lipid and cholesterol levels. These may be checked every 5 years, or more frequently if you are over 38 years old.  Hepatitis C test.  Hepatitis B test. Screening  Lung cancer screening. You may have this screening every year starting at age 46 if you have a 30-pack-year history of smoking and currently smoke or have quit within the past 15 years.  Prostate cancer screening.  Recommendations will vary depending on your family history and other risks.  Colorectal cancer screening. All adults should have this screening starting at age 54 and continuing until age 8. Your health care provider may recommend screening at age 65 if you are at increased risk. You will have tests every 1-10 years, depending on your results and the type of screening test.  Diabetes screening. This is done by checking your blood sugar (glucose) after you have not eaten for a while (fasting). You may have this done every 1-3 years.  Sexually transmitted disease (STD) testing. Follow these instructions at home: Eating and drinking  Eat a diet that includes fresh fruits and vegetables, whole grains, lean protein, and low-fat dairy products.  Take vitamin and mineral supplements as recommended by your health care provider.  Do not drink alcohol if your health care provider tells you not to drink.  If you drink alcohol: ? Limit how much you have to 0-2 drinks a day. ? Be aware of how much alcohol is in your drink. In the [place] one drink equals one 12 oz bottle of beer (355 mL), one 5 oz glass of wine (148 mL), or one 1 oz glass of hard liquor (44 mL). Lifestyle  Take daily care of your teeth and gums.  Stay active. Exercise for at least 30 minutes on 5 or more days each week.  Do not use any products that contain nicotine or tobacco, such as cigarettes, e-cigarettes, and chewing tobacco. If you need help quitting, ask your health care provider.  If you are sexually active, practice safe sex. Use a condom or other form of protection to prevent STIs (sexually transmitted infections).  Talk with your health care provider about taking a low-dose aspirin every day starting at age 81. What's next?  Go to your health care provider once a year for a well check visit.  Ask your health care provider how often you should have your eyes and teeth checked.  Stay up to date on all vaccines. This  information is not intended to replace advice given to you by your health care provider. Make sure you discuss any questions you have with your health care provider. Document Released: 01/29/2015 Document Revised: 12/27/2017 Document Reviewed: 12/27/2017 Elsevier Patient Education  2020 [employer].

## 2018-09-13 NOTE — Assessment & Plan Note (Signed)
Stable. No red flags. Continue flexeril and mobic as needed.

## 2018-10-21 DIAGNOSIS — D229 Melanocytic nevi, unspecified: Secondary | ICD-10-CM | POA: Diagnosis not present

## 2018-10-21 DIAGNOSIS — I872 Venous insufficiency (chronic) (peripheral): Secondary | ICD-10-CM | POA: Diagnosis not present

## 2018-10-21 DIAGNOSIS — L570 Actinic keratosis: Secondary | ICD-10-CM | POA: Diagnosis not present

## 2019-09-15 ENCOUNTER — Ambulatory Visit (INDEPENDENT_AMBULATORY_CARE_PROVIDER_SITE_OTHER): Payer: BC Managed Care – PPO | Admitting: Family Medicine

## 2019-09-15 ENCOUNTER — Other Ambulatory Visit: Payer: Self-pay

## 2019-09-15 ENCOUNTER — Encounter: Payer: Self-pay | Admitting: Family Medicine

## 2019-09-15 VITALS — BP 123/83 | HR 72 | Temp 97.6°F | Ht 73.0 in | Wt 277.6 lb

## 2019-09-15 DIAGNOSIS — R600 Localized edema: Secondary | ICD-10-CM

## 2019-09-15 DIAGNOSIS — R350 Frequency of micturition: Secondary | ICD-10-CM | POA: Diagnosis not present

## 2019-09-15 DIAGNOSIS — H9313 Tinnitus, bilateral: Secondary | ICD-10-CM | POA: Diagnosis not present

## 2019-09-15 DIAGNOSIS — E669 Obesity, unspecified: Secondary | ICD-10-CM

## 2019-09-15 DIAGNOSIS — R7303 Prediabetes: Secondary | ICD-10-CM | POA: Diagnosis not present

## 2019-09-15 DIAGNOSIS — N400 Enlarged prostate without lower urinary tract symptoms: Secondary | ICD-10-CM

## 2019-09-15 DIAGNOSIS — R946 Abnormal results of thyroid function studies: Secondary | ICD-10-CM | POA: Diagnosis not present

## 2019-09-15 DIAGNOSIS — Z1211 Encounter for screening for malignant neoplasm of colon: Secondary | ICD-10-CM

## 2019-09-15 DIAGNOSIS — Z0001 Encounter for general adult medical examination with abnormal findings: Secondary | ICD-10-CM | POA: Diagnosis not present

## 2019-09-15 LAB — POCT URINALYSIS DIPSTICK
Bilirubin, UA: NEGATIVE
Blood, UA: NEGATIVE
Glucose, UA: NEGATIVE
Ketones, UA: NEGATIVE
Leukocytes, UA: NEGATIVE
Nitrite, UA: NEGATIVE
Protein, UA: NEGATIVE
Spec Grav, UA: 1.025 (ref 1.010–1.025)
Urobilinogen, UA: 0.2 E.U./dL
pH, UA: 6 (ref 5.0–8.0)

## 2019-09-15 MED ORDER — AZELASTINE HCL 0.1 % NA SOLN: 2.0000 | Freq: Two times a day (BID) | NASAL | 12 refills | Status: DC

## 2019-09-15 MED ORDER — TAMSULOSIN HCL 0.4 MG PO CAPS: 0.4000 mg | ORAL_CAPSULE | Freq: Every day | ORAL | 3 refills | Status: DC

## 2019-09-15 MED ORDER — SAXENDA 18 MG/3ML ~~LOC~~ SOPN: 0.6000 mg | PEN_INJECTOR | Freq: Every day | SUBCUTANEOUS | 3 refills | Status: DC

## 2019-09-15 NOTE — Progress Notes (Signed)
Chief Complaint:  Stephen [name] is a 49 y.o. male who presents today for his annual comprehensive physical exam.    Assessment/Plan:  New/Acute Problems: Nasal Congestion No red flags.  Start Astelin nasal spray.  May need referral to ENT if persist  Chronic Problems Addressed Today: Benign prostatic hyperplasia No red flags.  UA normal.  Discussed behavioral modifications.  Will start Flomax 0.4 mg daily.  Check PSA.  May need referral to urology if symptoms not well controlled with Flomax.  Tinnitus of both ears No red flags.  Symptoms are currently manageable.  Prediabetes Check A1c.  Obesity Discussed lifestyle modifications.  Will restart Saxenda as he has done well with this in the past.  Discussed potential side effects.  Follow-up in 3 months if insurance approves it.  Body mass index is 36.62 kg/m. / Obese  BMI Metric Follow Up - 09/15/19 0955      BMI Metric Follow Up-Please document annually   BMI Metric Follow Up Education provided           Preventative Healthcare: Check CBC, CMET, TSH, lipid panel.  Check A1c.  Check PSA.  Will place referral for colonoscopy.   Patient Counseling(The following topics were reviewed and/or handout was given):  -Nutrition: Stressed importance of moderation in sodium/caffeine intake, saturated fat and cholesterol, caloric balance, sufficient intake of fresh fruits, vegetables, and fiber.  -Stressed the importance of regular exercise.   -Substance Abuse: Discussed cessation/primary prevention of tobacco, alcohol, or other drug use; driving or other dangerous activities under the influence; availability of treatment for abuse.   -Injury prevention: Discussed safety belts, safety helmets, smoke detector, smoking near bedding or upholstery.   -Sexuality: Discussed sexually transmitted diseases, partner selection, use of condoms, avoidance of unintended pregnancy and contraceptive alternatives.   -Dental health: Discussed importance  of regular tooth brushing, flossing, and dental visits.  -Health maintenance and immunizations reviewed. Please refer to Health maintenance section.  Return to care in 1 year for next preventative visit.     Subjective:  HPI:  He has a few additional things he would like to discuss today:  Tinnitus Occasionally has ringing in his ears. Occurs bilaterally. Normal hearing. Has been to multiple to concerts.   Nasal Congestion Lasts for about 10 minutes when going to sleep at night, Occurs about every night. Tried over the counter medications which have not been particularly effective.  No fevers or chills. No history of allergies.   Difficulty Urinating  Started about a year ago. More frequency and nocturia. No dysuria. No erectile dysfunction  Obesity He would also like to restart saxenda. He has done this in the past and done well.   Lifestyle Diet: Trying to watch what he is eating.  Exercise: None specific. Likes to walk.   Depression screen [facility] 2/9 09/13/2018  Decreased Interest 1  Down, Depressed, Hopeless 0  PHQ - 2 Score 1  Altered sleeping 0  Tired, decreased energy 1  Change in appetite 0  Feeling bad or failure about yourself  0  Trouble concentrating 0  Moving slowly or fidgety/restless 0  Suicidal thoughts 0  PHQ-9 Score 2  Difficult doing work/chores Not difficult at all    Health Maintenance Due  Topic Date Due  . Hepatitis C Screening  Never done     ROS: Per HPI, otherwise a complete review of systems was negative.   PMH:  The following were reviewed and entered/updated in epic: Past Medical History:  Diagnosis Date  .  Basal cell carcinoma   . Degenerative disc disease, lumbar    Patient Active Problem List   Diagnosis Date Noted  . Tinnitus of both ears 09/15/2019  . Benign prostatic hyperplasia 09/15/2019  . Obesity 09/15/2019  . Recurrent low back pain 09/13/2018  . Stasis dermatitis 09/13/2018  . Prediabetes 02/03/2017  . Bilateral  lower extremity edema 02/02/2017  . OSA (obstructive sleep apnea) 02/02/2017   Past Surgical History:  Procedure Laterality Date  . APPENDECTOMY    . EYE MUSCLE SURGERY      Family History  Problem Relation Age of Onset  . Hypertension Mother     Medications- reviewed and updated Current Outpatient Medications  Medication Sig Dispense Refill  . cyclobenzaprine (FLEXERIL) 10 MG tablet TAKE 1 TABLET BY MOUTH AS NEEDED FOR MUSCLE SPASMS. 30 tablet 0  . meloxicam (MOBIC) 15 MG tablet TAKE 1 TABLET BY MOUTH EVERY DAY (Patient taking differently: Take 15 mg by mouth as needed. ) 30 tablet 0  . azelastine (ASTELIN) 0.1 % nasal spray Place 2 sprays into both nostrils 2 (two) times daily. 30 mL 12  . Liraglutide -Weight Management (SAXENDA) 18 MG/3ML SOPN Inject 0.1 mLs (0.6 mg total) into the skin daily. 12 mL 3  . tamsulosin (FLOMAX) 0.4 MG CAPS capsule Take 1 capsule (0.4 mg total) by mouth daily. 30 capsule 3   No current facility-administered medications for this visit.    Allergies-reviewed and updated Allergies  Allergen Reactions  . Penicillins Nausea Only    Unknown - Childhood allergy    Social History   Socioeconomic History  . Marital status: Married    Spouse name: Not on file  . Number of children: Not on file  . Years of education: Not on file  . Highest education level: Not on file  Occupational History  . Not on file  Tobacco Use  . Smoking status: Never Smoker  . Smokeless tobacco: Never Used  Vaping Use  . Vaping Use: Never used  Substance and Sexual Activity  . Alcohol use: Yes    Comment: Socially  . Drug use: No  . Sexual activity: Yes    Partners: Female  Other Topics Concern  . Not on file  Social History Narrative  . Not on file   Social Determinants of Health   Financial Resource Strain:   . Difficulty of Paying Living Expenses: Not on file  Food Insecurity:   . Worried About Charity fundraiser in the Last Year: Not on file  . Ran Out  of Food in the Last Year: Not on file  Transportation Needs:   . Lack of Transportation (Medical): Not on file  . Lack of Transportation (Non-Medical): Not on file  Physical Activity:   . Days of Exercise per Week: Not on file  . Minutes of Exercise per Session: Not on file  Stress:   . Feeling of Stress : Not on file  Social Connections:   . Frequency of Communication with Friends and Family: Not on file  . Frequency of Social Gatherings with Friends and Family: Not on file  . Attends Religious Services: Not on file  . Active Member of Clubs or Organizations: Not on file  . Attends Archivist Meetings: Not on file  . Marital Status: Not on file        Objective:  Physical Exam: BP 123/83   Pulse 72   Temp 97.6 F (36.4 C) (Temporal)   Ht 6\' 1"  (1.854 m)  Wt 277 lb 9.6 oz (125.9 kg)   SpO2 97%   BMI 36.62 kg/m   Body mass index is 36.62 kg/m. Wt Readings from Last 3 Encounters:  09/15/19 277 lb 9.6 oz (125.9 kg)  09/13/18 279 lb 4 oz (126.7 kg)  03/21/18 286 lb (129.7 kg)   Gen: NAD, resting comfortably HEENT: TMs normal bilaterally. OP clear. No thyromegaly noted.  CV: RRR with no murmurs appreciated Pulm: NWOB, CTAB with no crackles, wheezes, or rhonchi GI: Normal bowel sounds present. Soft, Nontender, Nondistended. MSK: no edema, cyanosis, or clubbing noted Skin: warm, dry Neuro: CN2-12 grossly intact. Strength 5/5 in upper and lower extremities. Reflexes symmetric and intact bilaterally.  Psych: Normal affect and thought content     Annick Dimaio M. Jerline Pain, MD 09/15/2019 9:55 AM

## 2019-09-15 NOTE — Assessment & Plan Note (Signed)
No red flags.  Symptoms are currently manageable.

## 2019-09-15 NOTE — Assessment & Plan Note (Signed)
No red flags.  UA normal.  Discussed behavioral modifications.  Will start Flomax 0.4 mg daily.  Check PSA.  May need referral to urology if symptoms not well controlled with Flomax.

## 2019-09-15 NOTE — Patient Instructions (Signed)
It was very nice to see you today!  Please try the Astelin for your nasal congestion.  Please try the Flomax for your enlarged prostate.  Let me know how these work for you.  We will send in Saxenda.  If your insurance approves this, I would like to see you back in about 3 months for weight check.   Take care, Dr Jerline Pain  Please try these tips to maintain a healthy lifestyle:   Eat at least 3 REAL meals and 1-2 snacks per day.  Aim for no more than 5 hours between eating.  If you eat breakfast, please do so within one hour of getting up.    Each meal should contain half fruits/vegetables, one quarter protein, and one quarter carbs (no bigger than a computer mouse)   Cut down on sweet beverages. This includes juice, soda, and sweet tea.     Drink at least 1 glass of water with each meal and aim for at least 8 glasses per day   Exercise at least 150 minutes every week.    Preventive Care 17-60 Years Old, Male Preventive care refers to lifestyle choices and visits with your health care provider that can promote health and wellness. This includes:  A yearly physical exam. This is also called an annual well check.  Regular dental and eye exams.  Immunizations.  Screening for certain conditions.  Healthy lifestyle choices, such as eating a healthy diet, getting regular exercise, not using drugs or products that contain nicotine and tobacco, and limiting alcohol use. What can I expect for my preventive care visit? Physical exam Your health care provider will check:  Height and weight. These may be used to calculate body mass index (BMI), which is a measurement that tells if you are at a healthy weight.  Heart rate and blood pressure.  Your skin for abnormal spots. Counseling Your health care provider may ask you questions about:  Alcohol, tobacco, and drug use.  Emotional well-being.  Home and relationship well-being.  Sexual activity.  Eating habits.  Work  and work Statistician. What immunizations do I need?  Influenza (flu) vaccine  This is recommended every year. Tetanus, diphtheria, and pertussis (Tdap) vaccine  You may need a Td booster every 10 years. Varicella (chickenpox) vaccine  You may need this vaccine if you have not already been vaccinated. Zoster (shingles) vaccine  You may need this after age 38. Measles, mumps, and rubella (MMR) vaccine  You may need at least one dose of MMR if you were born in 1957 or later. You may also need a second dose. Pneumococcal conjugate (PCV13) vaccine  You may need this if you have certain conditions and were not previously vaccinated. Pneumococcal polysaccharide (PPSV23) vaccine  You may need one or two doses if you smoke cigarettes or if you have certain conditions. Meningococcal conjugate (MenACWY) vaccine  You may need this if you have certain conditions. Hepatitis A vaccine  You may need this if you have certain conditions or if you travel or work in places where you may be exposed to hepatitis A. Hepatitis B vaccine  You may need this if you have certain conditions or if you travel or work in places where you may be exposed to hepatitis B. Haemophilus influenzae type b (Hib) vaccine  You may need this if you have certain risk factors. Human papillomavirus (HPV) vaccine  If recommended by your health care provider, you may need three doses over 6 months. You may receive vaccines  as individual doses or as more than one vaccine together in one shot (combination vaccines). Talk with your health care provider about the risks and benefits of combination vaccines. What tests do I need? Blood tests  Lipid and cholesterol levels. These may be checked every 5 years, or more frequently if you are over 2 years old.  Hepatitis C test.  Hepatitis B test. Screening  Lung cancer screening. You may have this screening every year starting at age 14 if you have a 30-pack-year history of  smoking and currently smoke or have quit within the past 15 years.  Prostate cancer screening. Recommendations will vary depending on your family history and other risks.  Colorectal cancer screening. All adults should have this screening starting at age 14 and continuing until age 21. Your health care provider may recommend screening at age 40 if you are at increased risk. You will have tests every 1-10 years, depending on your results and the type of screening test.  Diabetes screening. This is done by checking your blood sugar (glucose) after you have not eaten for a while (fasting). You may have this done every 1-3 years.  Sexually transmitted disease (STD) testing. Follow these instructions at home: Eating and drinking  Eat a diet that includes fresh fruits and vegetables, whole grains, lean protein, and low-fat dairy products.  Take vitamin and mineral supplements as recommended by your health care provider.  Do not drink alcohol if your health care provider tells you not to drink.  If you drink alcohol: ? Limit how much you have to 0-2 drinks a day. ? Be aware of how much alcohol is in your drink. In the [place] one drink equals one 12 oz bottle of beer (355 mL), one 5 oz glass of wine (148 mL), or one 1 oz glass of hard liquor (44 mL). Lifestyle  Take daily care of your teeth and gums.  Stay active. Exercise for at least 30 minutes on 5 or more days each week.  Do not use any products that contain nicotine or tobacco, such as cigarettes, e-cigarettes, and chewing tobacco. If you need help quitting, ask your health care provider.  If you are sexually active, practice safe sex. Use a condom or other form of protection to prevent STIs (sexually transmitted infections).  Talk with your health care provider about taking a low-dose aspirin every day starting at age 85. What's next?  Go to your health care provider once a year for a well check visit.  Ask your health care provider  how often you should have your eyes and teeth checked.  Stay up to date on all vaccines. This information is not intended to replace advice given to you by your health care provider. Make sure you discuss any questions you have with your health care provider. Document Revised: 12/27/2017 Document Reviewed: 12/27/2017 Elsevier Patient Education  2020 [employer].

## 2019-09-15 NOTE — Assessment & Plan Note (Signed)
Discussed lifestyle modifications.  Will restart Saxenda as he has done well with this in the past.  Discussed potential side effects.  Follow-up in 3 months if insurance approves it.

## 2019-09-15 NOTE — Assessment & Plan Note (Signed)
Check A1c. 

## 2019-09-16 ENCOUNTER — Other Ambulatory Visit: Payer: Self-pay | Admitting: *Deleted

## 2019-09-16 ENCOUNTER — Encounter: Payer: Self-pay | Admitting: Family Medicine

## 2019-09-16 DIAGNOSIS — R972 Elevated prostate specific antigen [PSA]: Secondary | ICD-10-CM

## 2019-09-16 LAB — CBC
HCT: 44.7 % (ref 38.5–50.0)
Hemoglobin: 15.4 g/dL (ref 13.2–17.1)
MCH: 31.7 pg (ref 27.0–33.0)
MCHC: 34.5 g/dL (ref 32.0–36.0)
MCV: 92 fL (ref 80.0–100.0)
MPV: 10 fL (ref 7.5–12.5)
Platelets: 176 10*3/uL (ref 140–400)
RBC: 4.86 10*6/uL (ref 4.20–5.80)
RDW: 12.6 % (ref 11.0–15.0)
WBC: 3.7 10*3/uL — ABNORMAL LOW (ref 3.8–10.8)

## 2019-09-16 LAB — COMPREHENSIVE METABOLIC PANEL
AG Ratio: 1.6 (calc) (ref 1.0–2.5)
ALT: 13 U/L (ref 9–46)
AST: 15 U/L (ref 10–40)
Albumin: 4.2 g/dL (ref 3.6–5.1)
Alkaline phosphatase (APISO): 63 U/L (ref 36–130)
BUN: 14 mg/dL (ref 7–25)
CO2: 27 mmol/L (ref 20–32)
Calcium: 9.1 mg/dL (ref 8.6–10.3)
Chloride: 107 mmol/L (ref 98–110)
Creat: 1.25 mg/dL (ref 0.60–1.35)
Globulin: 2.7 g/dL (calc) (ref 1.9–3.7)
Glucose, Bld: 94 mg/dL (ref 65–99)
Potassium: 4.1 mmol/L (ref 3.5–5.3)
Sodium: 141 mmol/L (ref 135–146)
Total Bilirubin: 0.7 mg/dL (ref 0.2–1.2)
Total Protein: 6.9 g/dL (ref 6.1–8.1)

## 2019-09-16 LAB — HEMOGLOBIN A1C
Hgb A1c MFr Bld: 5 % of total Hgb (ref <5.7)
Mean Plasma Glucose: 97 (calc)
eAG (mmol/L): 5.4 (calc)

## 2019-09-16 LAB — PSA: PSA: 7 ng/mL — ABNORMAL HIGH (ref <=4.0)

## 2019-09-16 LAB — TSH: TSH: 2.81 mIU/L (ref 0.40–4.50)

## 2019-09-16 NOTE — Progress Notes (Signed)
Please inform patient of the following:  PSA is elevated into the borderline range.  There are many things that can cause benign PSA elevations.  Recommend urology referral due to increase since last year to further evaluate.  Everything else is NORMAL.

## 2019-09-17 ENCOUNTER — Encounter: Payer: Self-pay | Admitting: Gastroenterology

## 2019-10-06 ENCOUNTER — Encounter: Payer: Self-pay | Admitting: Family Medicine

## 2019-10-21 ENCOUNTER — Other Ambulatory Visit: Payer: Self-pay

## 2019-10-21 ENCOUNTER — Encounter: Payer: Self-pay | Admitting: Family Medicine

## 2019-10-21 ENCOUNTER — Ambulatory Visit (INDEPENDENT_AMBULATORY_CARE_PROVIDER_SITE_OTHER): Payer: BC Managed Care – PPO | Admitting: Family Medicine

## 2019-10-21 VITALS — BP 133/84 | HR 76 | Temp 98.0°F | Ht 73.0 in | Wt 283.6 lb

## 2019-10-21 DIAGNOSIS — E669 Obesity, unspecified: Secondary | ICD-10-CM | POA: Diagnosis not present

## 2019-10-21 DIAGNOSIS — R972 Elevated prostate specific antigen [PSA]: Secondary | ICD-10-CM | POA: Diagnosis not present

## 2019-10-21 DIAGNOSIS — M542 Cervicalgia: Secondary | ICD-10-CM

## 2019-10-21 DIAGNOSIS — C61 Malignant neoplasm of prostate: Secondary | ICD-10-CM | POA: Insufficient documentation

## 2019-10-21 MED ORDER — PREDNISONE 50 MG PO TABS: ORAL_TABLET | ORAL | 0 refills | Status: DC

## 2019-10-21 MED ORDER — KETOROLAC TROMETHAMINE 60 MG/2ML IM SOLN
60.0000 mg | Freq: Once | INTRAMUSCULAR | Status: AC
  Administered 2019-10-21: 60 mg via INTRAMUSCULAR

## 2019-10-21 NOTE — Assessment & Plan Note (Signed)
Patient up about 6 pounds since last visit.  Has not yet started Saxenda.  He will need to follow-up in about 3 months after starting Saxenda.

## 2019-10-21 NOTE — Patient Instructions (Signed)
It was very nice to see you today!  I think you have a pinched nerve in your neck.  We will give you a Toradol injection today.  Please start the prednisone.  Please work on the exercises.  Let me know if not improving in the next 1 to 2 weeks.  Please call [facility] at [phone] to schedule your appointment.   Take care, Dr Jerline Pain  Please try these tips to maintain a healthy lifestyle:   Eat at least 3 REAL meals and 1-2 snacks per day.  Aim for no more than 5 hours between eating.  If you eat breakfast, please do so within one hour of getting up.    Each meal should contain half fruits/vegetables, one quarter protein, and one quarter carbs (no bigger than a computer mouse)   Cut down on sweet beverages. This includes juice, soda, and sweet tea.     Drink at least 1 glass of water with each meal and aim for at least 8 glasses per day   Exercise at least 150 minutes every week.

## 2019-10-21 NOTE — Assessment & Plan Note (Signed)
PSA 7 when checked a month ago.  He was referred to urology does not heard back from them.  We will check on status of referral.  Also gave information to patient and advised him to call their office as well.

## 2019-10-21 NOTE — Progress Notes (Signed)
   Stephen [name] is a 49 y.o. male who presents today for an office visit.  Assessment/Plan:  New/Acute Problems: Neck [name] With mild cervical radiculopathy.  Has not had adequate response to 50 mg meloxicam.  Will start prednisone burst 50 mg daily for 5 days then 25 mg daily for 2 days.  We will give 60 mg of Toradol today.  No red flag signs or symptoms.  Gust home exercises and handout was given.  If not improving the next 1 to 2 weeks would consider referral to sports medicine.  Chronic Problems Addressed Today: No problem-specific Assessment & Plan notes found for this encounter.     Subjective:  HPI:  Patient here for left neck and arm [name].  Started about a week ago.  Located in base of his left neck.  Radiates into his left arm.  Worse with certain motions.  Feels like a pinched nerve.  Had symptoms similar several years ago.  Has been try to take meloxicam and Flexeril with modest improvement.  No lower extremity weakness or numbness.       Objective:  Physical Exam: BP 133/84   Pulse 76   Temp 98 F (36.7 C) (Temporal)   Ht 6\' 1"  (1.854 m)   Wt 283 lb 9.6 oz (128.6 kg)   SpO2 98%   BMI 37.42 kg/m   Wt Readings from Last 3 Encounters:  10/21/19 283 lb 9.6 oz (128.6 kg)  09/15/19 277 lb 9.6 oz (125.9 kg)  09/13/18 279 lb 4 oz (126.7 kg)    Gen: No acute distress, resting comfortably MSK:  -Neck with no deformities.  Full range of motion with moderate [name].  Spurling positive on the left -Upper extremity: No deformities.  Neurovascular intact distally -Lower extremity no deformities.  Neurovascular intact distally. Neuro: Grossly normal, moves all extremities Psych: Normal affect and thought content      Stephen [name] M. Jerline Pain, MD 10/21/2019 1:12 PM

## 2019-10-31 DIAGNOSIS — G4733 Obstructive sleep apnea (adult) (pediatric): Secondary | ICD-10-CM | POA: Diagnosis not present

## 2019-10-31 DIAGNOSIS — J3489 Other specified disorders of nose and nasal sinuses: Secondary | ICD-10-CM | POA: Diagnosis not present

## 2019-10-31 DIAGNOSIS — J343 Hypertrophy of nasal turbinates: Secondary | ICD-10-CM | POA: Diagnosis not present

## 2019-11-07 ENCOUNTER — Ambulatory Visit (AMBULATORY_SURGERY_CENTER): Payer: Self-pay

## 2019-11-07 ENCOUNTER — Encounter: Payer: Self-pay | Admitting: Family Medicine

## 2019-11-07 ENCOUNTER — Other Ambulatory Visit: Payer: Self-pay

## 2019-11-07 ENCOUNTER — Other Ambulatory Visit: Payer: Self-pay | Admitting: *Deleted

## 2019-11-07 ENCOUNTER — Encounter: Payer: Self-pay | Admitting: Gastroenterology

## 2019-11-07 VITALS — Ht 73.0 in | Wt 283.0 lb

## 2019-11-07 DIAGNOSIS — Z1211 Encounter for screening for malignant neoplasm of colon: Secondary | ICD-10-CM

## 2019-11-07 DIAGNOSIS — M545 Low back pain, unspecified: Secondary | ICD-10-CM

## 2019-11-07 MED ORDER — GABAPENTIN 300 MG PO CAPS: 300.0000 mg | ORAL_CAPSULE | Freq: Every day | ORAL | 0 refills | Status: DC

## 2019-11-07 NOTE — Progress Notes (Signed)
No egg or soy allergy known to patient  No issues with past sedation with any surgeries or procedures No intubation problems in the past  No FH of Malignant Hyperthermia No diet pills per patient No home 02 use per patient  No blood thinners per patient  Pt denies issues with constipation  No A fib or A flutter  EMMI video via MyChart  COVID 19 guidelines implemented in PV today with Pt and RN  COVID vaccines completed on 03/2019 per pt;  Due to the COVID-19 pandemic we are asking patients to follow these guidelines. Please only bring one care partner. Please be aware that your care partner may wait in the car in the parking lot or if they feel like they will be too hot to wait in the car, they may wait in the lobby on the 4th floor. All care partners are required to wear a mask the entire time (we do not have any that we can provide them), they need to practice social distancing, and we will do a Covid check for all patient's and care partners when you arrive. Also we will check their temperature and your temperature. If the care partner waits in their car they need to stay in the parking lot the entire time and we will call them on their cell phone when the patient is ready for discharge so they can bring the car to the front of the building. Also all patient's will need to wear a mask into building. 

## 2019-11-11 ENCOUNTER — Other Ambulatory Visit: Payer: Self-pay | Admitting: Family Medicine

## 2019-11-13 ENCOUNTER — Ambulatory Visit (INDEPENDENT_AMBULATORY_CARE_PROVIDER_SITE_OTHER): Payer: BC Managed Care – PPO | Admitting: Family Medicine

## 2019-11-13 ENCOUNTER — Other Ambulatory Visit: Payer: Self-pay

## 2019-11-13 ENCOUNTER — Encounter: Payer: Self-pay | Admitting: Family Medicine

## 2019-11-13 ENCOUNTER — Ambulatory Visit (INDEPENDENT_AMBULATORY_CARE_PROVIDER_SITE_OTHER): Payer: BC Managed Care – PPO

## 2019-11-13 VITALS — BP 120/86 | HR 85 | Ht 73.0 in | Wt 285.0 lb

## 2019-11-13 DIAGNOSIS — M79602 Pain in left arm: Secondary | ICD-10-CM

## 2019-11-13 DIAGNOSIS — R29898 Other symptoms and signs involving the musculoskeletal system: Secondary | ICD-10-CM | POA: Diagnosis not present

## 2019-11-13 DIAGNOSIS — M5412 Radiculopathy, cervical region: Secondary | ICD-10-CM | POA: Diagnosis not present

## 2019-11-13 DIAGNOSIS — M542 Cervicalgia: Secondary | ICD-10-CM | POA: Diagnosis not present

## 2019-11-13 NOTE — Progress Notes (Signed)
Subjective:    I'm seeing this patient as a consultation for:  Dr. Jerline Pain. Note will be routed back to referring provider/PCP.  CC: Neck and L UE pain  I, Molly Weber, LAT, ATC, am serving as scribe for Dr. Lynne Leader.  HPI: Pt is a 49 y/o male presenting w/ c/o neck and L UE pain radiating into his L forearm x 5 weeks. He noes some weakness into his left arm a bit.  He notes pain is improved with overhead motion.   Radiating pain: yes into the L UE to the forearm L UE numbness/tingling: yes in the L 2nd and 3rd fingers L UE weakness: Yes Aggravating factors: laying in bed Treatments tried: prednisone dose pack; Gabapentin; Toradol injection; Flexeril: Meloxicam  Diagnostic testing: C-spine XR- 07/06/17  Past medical history, Surgical history, Family history, Social history, Allergies, and medications have been entered into the medical record, reviewed.   Review of Systems: No new headache, visual changes, nausea, vomiting, diarrhea, constipation, dizziness, abdominal pain, skin rash, fevers, chills, night sweats, weight loss, swollen lymph nodes, body aches, joint swelling, muscle aches, chest pain, shortness of breath, mood changes, visual or auditory hallucinations.   Objective:    Vitals:   11/13/19 0752  BP: 120/86  Pulse: 85  SpO2: 97%   General: Well Developed, well nourished, and in no acute distress.  Neuro/Psych: Alert and oriented x3, extra-ocular muscles intact, able to move all 4 extremities, sensation grossly intact. Skin: Warm and dry, no rashes noted.  Respiratory: Not using accessory muscles, speaking in full sentences, trachea midline.  Cardiovascular: Pulses palpable, no extremity edema. Abdomen: Does not appear distended. MSK: Cspine: ROM intact except extension which is limited.  Upper extremity strength diminished left triceps (4/5) extension normal otherwise bilaterally. Reflexes intact bilaterally. Sensation intact throughout. Positive left-sided  Spurling's test.  Lab and Radiology Results  X-ray images C-spine obtained today personally and independently interpreted. Loss of cervical lordosis.  No severe degenerative changes.  No fractures. Await formal radiology review  Impression and Recommendations:    Assessment and Plan: 49 y.o. male with cervical radiculopathy left C7 associated with dermatomal pattern and weakness in triceps extension. He is already had trials of gabapentin and prednisone with little benefit.  Plan to increase gabapentin dose to 600-900 mg at bedtime.  We will proceed to x-ray and MRI of cervical spine to evaluate cause of pain and for epidural steroid injection planning.  Recheck following MRI.  We will go ahead and order epidural steroid injection if MRI corresponds to anticipated C7 radiculopathy.   PDMP not reviewed this encounter. Orders Placed This Encounter  Procedures  . DG Cervical Spine 2 or 3 views    Standing Status:   Future    Number of Occurrences:   1    Standing Expiration Date:   12/14/2019    Order Specific Question:   Reason for Exam (SYMPTOM  OR DIAGNOSIS REQUIRED)    Answer:   L arm pain    Order Specific Question:   Preferred imaging location?    Answer:   [facility]  . MR CERVICAL SPINE WO CONTRAST    Standing Status:   Future    Standing Expiration Date:   11/12/2020    Order Specific Question:   What is the patient's sedation requirement?    Answer:   No Sedation    Order Specific Question:   Does the patient have a pacemaker or implanted devices?    Answer:  No    Order Specific Question:   Preferred imaging location?    Answer:   [facility] (table limit-350lbs)   No orders of the defined types were placed in this encounter.   Discussed warning signs or symptoms. Please see discharge instructions. Patient expresses understanding.   The above documentation has been reviewed and is accurate and complete Lynne Leader, M.D.

## 2019-11-13 NOTE — Patient Instructions (Addendum)
Thank you for coming in today.  Please get an Xray today before you leave  You should hear from MRI scheduling within 1 week. If you do not hear please let me know.   Ok to increase the gabapentin to 1-3 pills at bedtime as needed.  Let me know if you find an effective dose. Let me know if not helpful. We can try something else.   Let me know if worsening especially weakness or both side symptoms.  Anticipate epidural steroid injection after MRI.   Epidural Steroid Injection Patient Information  Description: The epidural space surrounds the nerves as they exit the spinal cord.  In some patients, the nerves can be compressed and inflamed by a bulging disc or a tight spinal canal (spinal stenosis).  By injecting steroids into the epidural space, we can bring irritated nerves into direct contact with a potentially helpful medication.  These steroids act directly on the irritated nerves and can reduce swelling and inflammation which often leads to decreased pain.  Epidural steroids may be injected anywhere along the spine and from the neck to the low back depending upon the location of your pain.   After numbing the skin with local anesthetic (like Novocaine), a small needle is passed into the epidural space slowly.  You may experience a sensation of pressure while this is being done.  The entire block usually last less than 10 minutes.  Conditions which may be treated by epidural steroids:   Low back and leg pain  Neck and arm pain  Spinal stenosis  Post-laminectomy syndrome  Herpes zoster (shingles) pain  Pain from compression fractures  Preparation for the injection:  1. Do not eat any solid food or dairy products within 8 hours of your appointment.  2. You may drink clear liquids up to 3 hours before appointment.  Clear liquids include water, black coffee, juice or soda.  No milk or cream please. 3. You may take your regular medication, including pain medications, with a sip of  water before your appointment  Diabetics should hold regular insulin (if taken separately) and take 1/2 normal NPH dos the morning of the procedure.  Carry some sugar containing items with you to your appointment. 4. A driver must accompany you and be prepared to drive you home after your procedure.  5. Bring all your current medications with your. 6. An IV may be inserted and sedation may be given at the discretion of the physician.   7. A blood pressure cuff, EKG and other monitors will often be applied during the procedure.  Some patients may need to have extra oxygen administered for a short period. 8. You will be asked to provide medical information, including your allergies, prior to the procedure.  We must know immediately if you are taking blood thinners (like Coumadin/Warfarin)  Or if you are allergic to IV iodine contrast (dye). We must know if you could possible be pregnant.  Possible side-effects:  Bleeding from needle site  Infection (rare, may require surgery)  Nerve injury (rare)  Numbness & tingling (temporary)  Difficulty urinating (rare, temporary)  Spinal headache ( a headache worse with upright posture)  Light -headedness (temporary)  Pain at injection site (several days)  Decreased blood pressure (temporary)  Weakness in arm/leg (temporary)  Pressure sensation in back/neck (temporary)  Call if you experience:  Fever/chills associated with headache or increased back/neck pain.  Headache worsened by an upright position.  New onset weakness or numbness of an extremity below  the injection site  Hives or difficulty breathing (go to the emergency room)  Inflammation or drainage at the infection site  Severe back/neck pain  Any new symptoms which are concerning to you  Please note:  Although the local anesthetic injected can often make your back or neck feel good for several hours after the injection, the pain will likely return.  It takes 3-7 days for  steroids to work in the epidural space.  You may not notice any pain relief for at least that one week.  If effective, we will often do a series of three injections spaced 3-6 weeks apart to maximally decrease your pain.  After the initial series, we generally will wait several months before considering a repeat injection of the same type.

## 2019-11-16 ENCOUNTER — Ambulatory Visit (INDEPENDENT_AMBULATORY_CARE_PROVIDER_SITE_OTHER): Payer: BC Managed Care – PPO

## 2019-11-16 ENCOUNTER — Other Ambulatory Visit: Payer: Self-pay

## 2019-11-16 DIAGNOSIS — R29898 Other symptoms and signs involving the musculoskeletal system: Secondary | ICD-10-CM

## 2019-11-16 DIAGNOSIS — M79602 Pain in left arm: Secondary | ICD-10-CM

## 2019-11-16 DIAGNOSIS — M50223 Other cervical disc displacement at C6-C7 level: Secondary | ICD-10-CM | POA: Diagnosis not present

## 2019-11-16 DIAGNOSIS — M542 Cervicalgia: Secondary | ICD-10-CM | POA: Diagnosis not present

## 2019-11-16 DIAGNOSIS — M5412 Radiculopathy, cervical region: Secondary | ICD-10-CM

## 2019-11-17 NOTE — Progress Notes (Signed)
X-ray cervical spine shows evidence of spasm.  No fractures or severe arthritis

## 2019-11-18 ENCOUNTER — Telehealth: Payer: Self-pay | Admitting: Family Medicine

## 2019-11-18 DIAGNOSIS — M79602 Pain in left arm: Secondary | ICD-10-CM

## 2019-11-18 DIAGNOSIS — R29898 Other symptoms and signs involving the musculoskeletal system: Secondary | ICD-10-CM

## 2019-11-18 DIAGNOSIS — M5412 Radiculopathy, cervical region: Secondary | ICD-10-CM

## 2019-11-18 NOTE — Telephone Encounter (Signed)
Epidural steroid injection ordered 

## 2019-11-18 NOTE — Progress Notes (Signed)
MRI shows bulging disc likely pressing on the left C7 nerve root which would correspond to your symptoms.  There also is possibility for right C6 nerve root being pinched however you do not have much symptoms on that side making this less likely.  I have already ordered epidural steroid injection.  Please call [facility] to schedule at [phone].  Schedule follow-up appoint with me to discuss MRI results if needed.

## 2019-11-21 ENCOUNTER — Ambulatory Visit (AMBULATORY_SURGERY_CENTER): Payer: BC Managed Care – PPO | Admitting: Gastroenterology

## 2019-11-21 ENCOUNTER — Encounter: Payer: Self-pay | Admitting: Gastroenterology

## 2019-11-21 ENCOUNTER — Other Ambulatory Visit: Payer: Self-pay

## 2019-11-21 VITALS — BP 120/81 | HR 81 | Temp 96.9°F | Resp 19 | Ht 73.0 in | Wt 283.0 lb

## 2019-11-21 DIAGNOSIS — Z1211 Encounter for screening for malignant neoplasm of colon: Secondary | ICD-10-CM | POA: Diagnosis not present

## 2019-11-21 DIAGNOSIS — K5289 Other specified noninfective gastroenteritis and colitis: Secondary | ICD-10-CM | POA: Diagnosis not present

## 2019-11-21 DIAGNOSIS — D123 Benign neoplasm of transverse colon: Secondary | ICD-10-CM

## 2019-11-21 MED ORDER — SODIUM CHLORIDE 0.9 % IV SOLN
500.0000 mL | INTRAVENOUS | Status: DC
Start: 2019-11-21 — End: 2019-11-21

## 2019-11-21 NOTE — Progress Notes (Signed)
pt tolerated well. VSS. awake and to recovery. Report given to RN.  

## 2019-11-21 NOTE — Patient Instructions (Signed)
Handout provided on polyps.   No aspirin, ibuprofen, naproxen, meloxicam or other non-steroidal anti-inflammatory drugs (NSAIDs).   YOU HAD AN ENDOSCOPIC PROCEDURE TODAY AT [facility]:   Refer to the procedure report that was given to you for any specific questions about what was found during the examination.  If the procedure report does not answer your questions, please call your gastroenterologist to clarify.  If you requested that your care partner not be given the details of your procedure findings, then the procedure report has been included in a sealed envelope for you to review at your convenience later.  YOU SHOULD EXPECT: Some feelings of bloating in the abdomen. Passage of more gas than usual.  Walking can help get rid of the air that was put into your GI tract during the procedure and reduce the bloating. If you had a lower endoscopy (such as a colonoscopy or flexible sigmoidoscopy) you may notice spotting of blood in your stool or on the toilet paper. If you underwent a bowel prep for your procedure, you may not have a normal bowel movement for a few days.  Please Note:  You might notice some irritation and congestion in your nose or some drainage.  This is from the oxygen used during your procedure.  There is no need for concern and it should clear up in a day or so.  SYMPTOMS TO REPORT IMMEDIATELY:   Following lower endoscopy (colonoscopy or flexible sigmoidoscopy):  Excessive amounts of blood in the stool  Significant tenderness or worsening of abdominal pains  Swelling of the abdomen that is new, acute  Fever of 100F or higher  For urgent or emergent issues, a gastroenterologist can be reached at any hour by calling [phone]. Do not use MyChart messaging for urgent concerns.    DIET:  We do recommend a small meal at first, but then you may proceed to your regular diet.  Drink plenty of fluids but you should avoid alcoholic beverages for 24  hours.  ACTIVITY:  You should plan to take it easy for the rest of today and you should NOT DRIVE or use heavy machinery until tomorrow (because of the sedation medicines used during the test).    FOLLOW UP: Our staff will call the number listed on your records 48-72 hours following your procedure to check on you and address any questions or concerns that you may have regarding the information given to you following your procedure. If we do not reach you, we will leave a message.  We will attempt to reach you two times.  During this call, we will ask if you have developed any symptoms of COVID 19. If you develop any symptoms (ie: fever, flu-like symptoms, shortness of breath, cough etc.) before then, please call [phone].  If you test positive for Covid 19 in the 2 weeks post procedure, please call and report this information to [place].    If any biopsies were taken you will be contacted by phone or by letter within the next 1-3 weeks.  Please call us at [phone] if you have not heard about the biopsies in 3 weeks.    SIGNATURES/CONFIDENTIALITY: You and/or your care partner have signed paperwork which will be entered into your electronic medical record.  These signatures attest to the fact that that the information above on your After Visit Summary has been reviewed and is understood.  Full responsibility of the confidentiality of this discharge information lies with you and/or your care-partner.

## 2019-11-21 NOTE — Op Note (Signed)
[facility] Patient Name: Stephen [name] Procedure Date: 11/21/2019 8:17 AM MRN: [id number] Endoscopist: Thornton Park MD, MD Age: 49 Referring MD:  Date of Birth: [birth year] Gender: Male Account #: [id number] Procedure:                Colonoscopy Indications:              Screening for colorectal malignant neoplasm, This                            is the patient's first colonoscopy                           No known family history of colon cancer or polyps Medicines:                Monitored Anesthesia Care Procedure:                Pre-Anesthesia Assessment:                           - Prior to the procedure, a History and Physical                            was performed, and patient medications and                            allergies were reviewed. The patient's tolerance of                            previous anesthesia was also reviewed. The risks                            and benefits of the procedure and the sedation                            options and risks were discussed with the patient.                            All questions were answered, and informed consent                            was obtained. Prior Anticoagulants: The patient has                            taken no previous anticoagulant or antiplatelet                            agents. ASA Grade Assessment: III - A patient with                            severe systemic disease. After reviewing the risks                            and benefits, the patient was deemed in  satisfactory condition to undergo the procedure.                           After obtaining informed consent, the colonoscope                            was passed under direct vision. Throughout the                            procedure, the patient's blood pressure, pulse, and                            oxygen saturations were monitored continuously. The                            Colonoscope was  introduced through the anus and                            advanced to the 10 cm into the ileum. A second                            forward view of the right colon was performed. The                            colonoscopy was performed without difficulty. The                            patient tolerated the procedure well. The quality                            of the bowel preparation was good. The terminal                            ileum, ileocecal valve, appendiceal orifice, and                            rectum were photographed. Scope In: 8:28:38 AM Scope Out: 8:45:31 AM Scope Withdrawal Time: 0 hours 13 minutes 25 seconds  Total Procedure Duration: 0 hours 16 minutes 53 seconds  Findings:                 The perianal and digital rectal examinations were                            normal.                           A 2 mm polyp was found in the transverse colon. The                            polyp was sessile. The polyp was removed with a                            cold snare. Resection and retrieval were complete.  Estimated blood loss was minimal.                           The colon (entire examined portion) appeared                            normal. Biopsies for histology were taken with a                            cold forceps from the right colon, left colon and                            transverse colon. Estimated blood loss was minimal.                           Three patchy areas of erythematous mucosa were seen                            in the distal ileum. Biopsies were taken with a                            cold forceps for histology. Estimated blood loss                            was minimal.                           The exam was otherwise without abnormality on                            direct and retroflexion views. Complications:            No immediate complications. Estimated blood loss:                             Minimal. Estimated Blood Loss:     Estimated blood loss was minimal. Impression:               - One 2 mm polyp in the transverse colon, removed                            with a cold snare. Resected and retrieved.                           - The entire examined colon is normal. Biopsied.                           - Erythematous mucosa in the distal ileum                            consistent with iileitis. Findings could be related                            to NSAIDs such as meloxicam. Biopsied.                           -  The examination was otherwise normal on direct                            and retroflexion views. Recommendation:           - Patient has a contact number available for                            emergencies. The signs and symptoms of potential                            delayed complications were discussed with the                            patient. Return to normal activities tomorrow.                            Written discharge instructions were provided to the                            patient.                           - Resume previous diet.                           - Continue present medications.                           - No aspirin, ibuprofen, naproxen, meloxicam or                            other non-steroidal anti-inflammatory drugs.                           - Await pathology results.                           - Repeat colonoscopy date to be determined after                            pending pathology results are reviewed for                            surveillance.                           - Emerging evidence supports eating a diet of                            fruits, vegetables, grains, calcium, and yogurt                            while reducing red meat and alcohol may reduce the                            risk of colon  cancer.                           - Thank you for allowing me to be involved in your                            colon  cancer prevention. Thornton Park MD, MD 11/21/2019 8:51:21 AM This report has been signed electronically.

## 2019-11-21 NOTE — Progress Notes (Signed)
Pt's states no medical or surgical changes since previsit or office visit. 

## 2019-11-21 NOTE — Progress Notes (Signed)
Called to room to assist during endoscopic procedure.  Patient ID and intended procedure confirmed with present staff. Received instructions for my participation in the procedure from the performing physician.  

## 2019-11-25 ENCOUNTER — Telehealth: Payer: Self-pay | Admitting: *Deleted

## 2019-11-25 ENCOUNTER — Ambulatory Visit
Admission: RE | Admit: 2019-11-25 | Discharge: 2019-11-25 | Disposition: A | Discharge status: Home / Self Care | Payer: BC Managed Care – PPO | Source: Ambulatory Visit | Attending: Family Medicine | Admitting: Family Medicine

## 2019-11-25 ENCOUNTER — Telehealth: Payer: Self-pay

## 2019-11-25 ENCOUNTER — Other Ambulatory Visit: Payer: Self-pay

## 2019-11-25 DIAGNOSIS — M79602 Pain in left arm: Secondary | ICD-10-CM

## 2019-11-25 DIAGNOSIS — M50123 Cervical disc disorder at C6-C7 level with radiculopathy: Secondary | ICD-10-CM | POA: Diagnosis not present

## 2019-11-25 DIAGNOSIS — R29898 Other symptoms and signs involving the musculoskeletal system: Secondary | ICD-10-CM

## 2019-11-25 DIAGNOSIS — M5412 Radiculopathy, cervical region: Secondary | ICD-10-CM

## 2019-11-25 MED ORDER — IOPAMIDOL (ISOVUE-M 300) INJECTION 61%
1.0000 mL | Freq: Once | INTRAMUSCULAR | Status: AC | PRN
  Administered 2019-11-25: 1 mL via EPIDURAL

## 2019-11-25 MED ORDER — TRIAMCINOLONE ACETONIDE 40 MG/ML IJ SUSP (RADIOLOGY)
60.0000 mg | Freq: Once | INTRAMUSCULAR | Status: AC
  Administered 2019-11-25: 60 mg via EPIDURAL

## 2019-11-25 NOTE — Discharge Instructions (Signed)

## 2019-11-25 NOTE — Telephone Encounter (Signed)
Left message on answering machine. 

## 2019-11-25 NOTE — Telephone Encounter (Signed)
Patient called, no answer. Left VM.

## 2019-12-16 ENCOUNTER — Other Ambulatory Visit: Payer: Self-pay

## 2019-12-16 ENCOUNTER — Ambulatory Visit: Payer: BC Managed Care – PPO | Admitting: Family Medicine

## 2019-12-16 ENCOUNTER — Encounter: Payer: Self-pay | Admitting: Family Medicine

## 2019-12-16 VITALS — BP 130/80 | HR 74 | Temp 97.9°F | Ht 73.0 in | Wt 280.6 lb

## 2019-12-16 DIAGNOSIS — G4733 Obstructive sleep apnea (adult) (pediatric): Secondary | ICD-10-CM

## 2019-12-16 DIAGNOSIS — R7303 Prediabetes: Secondary | ICD-10-CM

## 2019-12-16 DIAGNOSIS — N400 Enlarged prostate without lower urinary tract symptoms: Secondary | ICD-10-CM

## 2019-12-16 DIAGNOSIS — E669 Obesity, unspecified: Secondary | ICD-10-CM

## 2019-12-16 DIAGNOSIS — T39395A Adverse effect of other nonsteroidal anti-inflammatory drugs [NSAID], initial encounter: Secondary | ICD-10-CM

## 2019-12-16 MED ORDER — VICTOZA 18 MG/3ML ~~LOC~~ SOPN: PEN_INJECTOR | SUBCUTANEOUS | 5 refills | Status: DC

## 2019-12-16 MED ORDER — TAMSULOSIN HCL 0.4 MG PO CAPS
0.4000 mg | ORAL_CAPSULE | Freq: Every day | ORAL | 3 refills | Status: DC
Start: 2019-12-16 — End: 2020-05-28

## 2019-12-16 NOTE — Assessment & Plan Note (Signed)
Will be following up with urology soon.  Continue Flomax 0.4 mg daily.  Refill sent in today.

## 2019-12-16 NOTE — Patient Instructions (Addendum)
It was very nice to see you today!  I will send in prescription for Victoza.  Please start with 0.6mg  daily for the first week and then increase to 1.2 mg daily.  I will place a referral for you to see a sleep specialist within [facility].  I will refill your Flomax today.  I will see you back in 3 months if you are able to start the Victoza.  Please come back to see me sooner if needed.  Take care, Dr Jerline Pain  Please try these tips to maintain a healthy lifestyle:   Eat at least 3 REAL meals and 1-2 snacks per day.  Aim for no more than 5 hours between eating.  If you eat breakfast, please do so within one hour of getting up.    Each meal should contain half fruits/vegetables, one quarter protein, and one quarter carbs (no bigger than a computer mouse)   Cut down on sweet beverages. This includes juice, soda, and sweet tea.     Drink at least 1 glass of water with each meal and aim for at least 8 glasses per day   Exercise at least 150 minutes every week.

## 2019-12-16 NOTE — Progress Notes (Signed)
   Stephen [name] is a 49 y.o. male who presents today for an office visit.  Assessment/Plan:  Chronic Problems Addressed Today: Adverse reaction to NSAIDs Found to have distal ileum irritation on recent colonoscopy.  Per GI he should avoid NSAIDs if posible.   Obesity We will start liraglutide.  Continue lifestyle modifications.  Follow-up in 3 months.  Benign prostatic hyperplasia Will be following up with urology soon.  Continue Flomax 0.4 mg daily.  Refill sent in today.  Prediabetes Start Victoza 0.6 mg daily for 1 week and then increase to 1.2 mg daily.  Follow-up with me in 3 months.  OSA (obstructive sleep apnea) Will place referral to sleep studies discussed getting CPAP machine.     Subjective:  HPI:  See A/p.         Objective:  Physical Exam: BP 130/80   Pulse 74   Temp 97.9 F (36.6 C) (Temporal)   Ht 6\' 1"  (1.854 m)   Wt 280 lb 9.6 oz (127.3 kg)   SpO2 98%   BMI 37.02 kg/m   Gen: No acute distress, resting comfortably Neuro: Grossly normal, moves all extremities Psych: Normal affect and thought content      [name] M. Jerline Pain, MD 12/16/2019 8:36 AM

## 2019-12-16 NOTE — Assessment & Plan Note (Signed)
We will start liraglutide.  Continue lifestyle modifications.  Follow-up in 3 months.

## 2019-12-16 NOTE — Assessment & Plan Note (Signed)
Start Victoza 0.6 mg daily for 1 week and then increase to 1.2 mg daily.  Follow-up with me in 3 months.

## 2019-12-16 NOTE — Assessment & Plan Note (Signed)
Found to have distal ileum irritation on recent colonoscopy.  Per GI he should avoid NSAIDs if posible.

## 2019-12-16 NOTE — Assessment & Plan Note (Signed)
Will place referral to sleep studies discussed getting CPAP machine.

## 2020-01-08 DIAGNOSIS — Z20822 Contact with and (suspected) exposure to covid-19: Secondary | ICD-10-CM | POA: Diagnosis not present

## 2020-01-27 DIAGNOSIS — Z20822 Contact with and (suspected) exposure to covid-19: Secondary | ICD-10-CM | POA: Diagnosis not present

## 2020-02-04 DIAGNOSIS — N5201 Erectile dysfunction due to arterial insufficiency: Secondary | ICD-10-CM | POA: Diagnosis not present

## 2020-02-04 DIAGNOSIS — R972 Elevated prostate specific antigen [PSA]: Secondary | ICD-10-CM | POA: Diagnosis not present

## 2020-03-15 DIAGNOSIS — D075 Carcinoma in situ of prostate: Secondary | ICD-10-CM | POA: Diagnosis not present

## 2020-03-15 DIAGNOSIS — R972 Elevated prostate specific antigen [PSA]: Secondary | ICD-10-CM | POA: Diagnosis not present

## 2020-03-17 NOTE — Progress Notes (Signed)
I, Stephen [name], LAT, ATC, am serving as scribe for Dr. Lynne Leader.  Stephen [name] is a 50 y.o. male who presents to [place] at [facility] today for f/u of L arm pain secondary to cervical radiculopathy.  He was last seen by Dr. Georgina Snell on 11/13/19 and was referred for a cervical MRI.  He was also advised to increase his Gabapentin dosage from 1-3 pills at bedtime as needed. After his colonoscopy, pt was advised to dc Gabapentin due to inflammation seen in his colon. Pt was later referred for a cervical epidural (L C7) that he had on 11/25/19. Since his last visit w/ Dr. Georgina Snell, pt states L lower arm is better. Pt now locates pain to L upper arm around bicep and deep to the bicep. Pt reports pain started in upper arm after epidural injection.  Pain is worse with overhead motion and reaching back.  Pain also bothers him at bedtime.  Neck pain: yes Radiates: No UE numbness/tingling: No Aggravates: ABD above 90, sleeping on L side  Diagnostic imaging: C-spine MRI- 11/16/19; C-spine XR- 11/13/19  Pertinent review of systems: No fevers or chills  Relevant historical information: Sleep apnea   Exam:  BP 124/80 (BP Location: Right Arm, Patient Position: Sitting, Cuff Size: Normal)   Pulse 77   Ht 6\' 1"  (1.854 m)   Wt 286 lb 12.8 oz (130.1 kg)   SpO2 99%   BMI 37.84 kg/m  General: Well Developed, well nourished, and in no acute distress.   MSK: Left shoulder normal-appearing Nontender. Range of motion active and passive abduction limited to 100 degrees.   External rotation limited 30 degrees  internal rotation limited lumbar spine. Strength intact within limits of motion. Mildly positive Hawkins and Neer's test.   Negative empty can test Negative Yergason's and speeds test. Mildly positive crossover arm compression test. Pulses capillary fill and sensation are intact distally.    Lab and Radiology Results  X-ray images left shoulder obtained today personally  and independently interpreted No fractures malalignment or severe degenerative changes. Await formal radiology review  Procedure: Real-time Ultrasound Guided Injection of shoulder glenohumeral joint posterior approach. Device: Philips Affiniti 50G Images permanently stored and available for review in PACS Ultrasound evaluation prior to injection reveals intact biceps tendon and rotator cuff tendons Verbal informed consent obtained.  Discussed risks and benefits of procedure. Warned about infection bleeding damage to structures skin hypopigmentation and fat atrophy among others. Patient expresses understanding and agreement Time-out conducted.   Noted no overlying erythema, induration, or other signs of local infection.   Skin prepped in a sterile fashion.   Local anesthesia: Topical Ethyl chloride.   With sterile technique and under real time ultrasound guidance:  40 mg of Kenalog and 2 mL of Marcaine injected into glenohumeral joint. Fluid seen entering the joint capsule.   Completed without difficulty   Pain moderately resolved suggesting accurate placement of the medication.   Advised to call if fevers/chills, erythema, induration, drainage, or persistent bleeding.   Images permanently stored and available for review in the ultrasound unit.  Impression: Technically successful ultrasound guided injection.         Assessment and Plan: 50 y.o. male with left shoulder pain.  Patient had left arm pain that in retrospect was probably multifactorial.  Part of the pain was cervical radiculopathy.  Today's pain is originating from the shoulder and at this point most likely to be due to adhesive capsulitis.  He may have a rotator  cuff tendinopathy component as well.  Plan for glenohumeral injection and physical therapy.  Cervical radicular component is much less of an issue now. Recheck in about 6 weeks.  If not better consider shoulder MRI.   PDMP not reviewed this encounter. Orders  Placed This Encounter  Procedures  . [place] LIMITED JOINT SPACE STRUCTURES UP LEFT(NO LINKED CHARGES)    Standing Status:   Future    Number of Occurrences:   1    Standing Expiration Date:   09/18/2020    Order Specific Question:   Reason for Exam (SYMPTOM  OR DIAGNOSIS REQUIRED)    Answer:   left arm pain    Order Specific Question:   Preferred imaging location?    Answer:   [facility]  . DG Shoulder Left    Standing Status:   Future    Number of Occurrences:   1    Standing Expiration Date:   03/18/2021    Order Specific Question:   Reason for Exam (SYMPTOM  OR DIAGNOSIS REQUIRED)    Answer:   eval left shoulder    Order Specific Question:   Preferred imaging location?    Answer:   [facility]  . Ambulatory referral to Physical Therapy    Referral Priority:   Routine    Referral Type:   Physical Medicine    Referral Reason:   Specialty Services Required    Requested Specialty:   Physical Therapy   No orders of the defined types were placed in this encounter.    Discussed warning signs or symptoms. Please see discharge instructions. Patient expresses understanding.   The above documentation has been reviewed and is accurate and complete Lynne Leader, M.D.

## 2020-03-18 ENCOUNTER — Ambulatory Visit (INDEPENDENT_AMBULATORY_CARE_PROVIDER_SITE_OTHER): Payer: BC Managed Care – PPO

## 2020-03-18 ENCOUNTER — Ambulatory Visit (INDEPENDENT_AMBULATORY_CARE_PROVIDER_SITE_OTHER): Payer: BC Managed Care – PPO | Admitting: Family Medicine

## 2020-03-18 ENCOUNTER — Ambulatory Visit: Payer: Self-pay

## 2020-03-18 ENCOUNTER — Other Ambulatory Visit: Payer: Self-pay

## 2020-03-18 VITALS — BP 124/80 | HR 77 | Ht 73.0 in | Wt 286.8 lb

## 2020-03-18 DIAGNOSIS — M79602 Pain in left arm: Secondary | ICD-10-CM

## 2020-03-18 DIAGNOSIS — M7502 Adhesive capsulitis of left shoulder: Secondary | ICD-10-CM

## 2020-03-18 DIAGNOSIS — M19012 Primary osteoarthritis, left shoulder: Secondary | ICD-10-CM | POA: Diagnosis not present

## 2020-03-18 NOTE — Patient Instructions (Signed)
Thank you for coming in today.  Please get an Xray today before you leave (left shoulder)  I've referred you to Physical Therapy.  Let us know if you don't hear from them in one week.  Recheck in 6 weeks.    Adhesive Capsulitis  Adhesive capsulitis, also called frozen shoulder, causes the shoulder to become stiff and painful to move. This condition happens when there is inflammation of the tendons and ligaments that surround the shoulder joint (shoulder capsule). What are the causes? This condition may be caused by:  An injury to your shoulder joint.  Straining your shoulder.  Not moving your shoulder for a period of time. This can happen if your arm was injured or in a sling.  Long-standing conditions, such as: ? Diabetes. ? Thyroid problems. ? Heart disease. ? Stroke. ? Rheumatoid arthritis. ? Lung disease. In some cases, the cause is not known. What increases the risk? You are more likely to develop this condition if you are:  A woman.  Older than 48 years of age. What are the signs or symptoms? Symptoms of this condition include:  Pain in your shoulder when you move your arm. There may also be pain when parts of your shoulder are touched. The pain may be worse at night or when you are resting.  A sore or aching shoulder.  The inability to move your shoulder normally.  Muscle spasms. How is this diagnosed? This condition is diagnosed with a physical exam and imaging tests, such as an X-ray or MRI. How is this treated? This condition may be treated with:  Treatment of the underlying cause or condition.  Medicine. Medicine may be given to relieve pain, inflammation, or muscle spasms.  Steroid injections into the shoulder joint.  Physical therapy. This involves performing exercises to get the shoulder moving again.  Acupuncture. This is a type of treatment that involves stimulating specific points on your body by inserting thin needles through your  skin.  Shoulder manipulation. This is a procedure to move the shoulder into another position. It is done after you are given a medicine to make you fall asleep (general anesthetic). The joint may also be injected with salt water at high pressure to break down scarring.  Surgery. This may be done in severe cases when other treatments have failed. Although most people recover completely from adhesive capsulitis, some may not regain full shoulder movement. Follow these instructions at home: Managing pain, stiffness, and swelling  If directed, put ice on the injured area: ? Put ice in a plastic bag. ? Place a towel between your skin and the bag. ? Leave the ice on for 20 minutes, 2-3 times per day.  If directed, apply heat to the affected area before you exercise. Use the heat source that your health care provider recommends, such as a moist heat pack or a heating pad. ? Place a towel between your skin and the heat source. ? Leave the heat on for 20-30 minutes. ? Remove the heat if your skin turns bright red. This is especially important if you are unable to feel pain, heat, or cold. You may have a greater risk of getting burned.      General instructions  Take over-the-counter and prescription medicines only as told by your health care provider.  If you are being treated with physical therapy, follow instructions from your physical therapist.  Avoid exercises that put a lot of demand on your shoulder, such as throwing. These exercises can make  pain worse.  Keep all follow-up visits as told by your health care provider. This is important. Contact a health care provider if:  You develop new symptoms.  Your symptoms get worse. Summary  Adhesive capsulitis, also called frozen shoulder, causes the shoulder to become stiff and painful to move.  You are more likely to have this condition if you are a woman and over age 52.  It is treated with physical therapy, medicines, and sometimes  surgery. This information is not intended to replace advice given to you by your health care provider. Make sure you discuss any questions you have with your health care provider. Document Revised: 06/08/2017 Document Reviewed: 06/08/2017 Elsevier Patient Education  2021 [employer].

## 2020-03-19 NOTE — Progress Notes (Signed)
X-ray left shoulder shows mild arthritis

## 2020-03-24 ENCOUNTER — Ambulatory Visit (INDEPENDENT_AMBULATORY_CARE_PROVIDER_SITE_OTHER): Payer: BC Managed Care – PPO | Admitting: Physical Therapy

## 2020-03-24 ENCOUNTER — Encounter: Payer: Self-pay | Admitting: Physical Therapy

## 2020-03-24 ENCOUNTER — Other Ambulatory Visit: Payer: Self-pay

## 2020-03-24 DIAGNOSIS — M25612 Stiffness of left shoulder, not elsewhere classified: Secondary | ICD-10-CM

## 2020-03-24 DIAGNOSIS — M25512 Pain in left shoulder: Secondary | ICD-10-CM

## 2020-03-24 DIAGNOSIS — M6281 Muscle weakness (generalized): Secondary | ICD-10-CM | POA: Diagnosis not present

## 2020-03-24 NOTE — Therapy (Signed)
[facility] [address] [place], [place], [address] Phone: [phone]   Fax:  [phone]  Physical Therapy Evaluation  Patient Details  Name: Stephen [name] MRN: [id number] Date of Birth: Apr 29, 1970 Referring Provider (PT): Dr. Georgina Snell   Encounter Date: 03/24/2020   PT End of Session - 03/24/20 0847    Visit Number 1    Number of Visits 17    Date for PT Re-Evaluation 04/23/20    Authorization Type BCBS    PT Start Time 0806    PT Stop Time 0850    PT Time Calculation (min) 44 min    Activity Tolerance Patient tolerated treatment well;Patient limited by pain    Behavior During Therapy [facility] for tasks assessed/performed           Past Medical History:  Diagnosis Date  . Basal cell carcinoma    multiple skin cancers removed  . Degenerative disc disease, lumbar   . Ruptured cervical disc   . Sleep apnea    no cpap    Past Surgical History:  Procedure Laterality Date  . APPENDECTOMY    . EYE MUSCLE SURGERY     2010/2011--lazy  . WISDOM TOOTH EXTRACTION      There were no vitals filed for this visit.    Subjective Assessment - 03/24/20 0809    Subjective Pt states that it initially started as a herniated disc in his neck which caused NT into the UE. He states it was caused from sleeping with two new pillow that were too tall. He states he recently got an injection into the arm and that is when the pain started happening. Now he has localized pain to the L upper arm, shoulder, and biceps. He feels a "searing pain" behind the bicep, especially doing a morning stretch. Moving too far hurts like washing hair, dressing, brushing teeth, putting a belt are difficult. He feels that it is pain limited rather than stiffness but it is stiff to move. Pain feels deep and right behind the biceps at mid substance. He describes it as a stabbing pain. He has not been using meds or thermotherapy for the shoulder since his colonoscopy show GI  issues and possible overuse of NSAIDs. He states that resting it is the best way to reduce pain. He currently has little NT into the fingers with some into the index finger occasionally. Pt denies pop, clicking, catching. Pt denies traumatic MOI. Pain is constant throughout the day and unchanging in characteristics. Pt denies cancer red flags. Best 0/10 Worst 8/10 pain. Stabbing, sharp, shooting pain.              [facility] PT Assessment - 03/24/20 0001      Assessment   Medical Diagnosis L shoulder pain    Referring Provider (PT) Dr. Georgina Snell    Prior Therapy N/A      Precautions   Precautions None      Restrictions   Weight Bearing Restrictions No      Balance Screen   Has the patient fallen in the past 6 months No      [place] residence    Living Arrangements Spouse/significant other;Children      Prior Function   Level of Independence Independent      Cognition   Overall Cognitive Status Within Functional Limits for tasks assessed      Observation/Other Assessments   Other Surveys  [facility]  25%      Posture/Postural Control   Posture/Postural Control Postural limitations    Postural Limitations Increased thoracic kyphosis;Rounded Shoulders      ROM / Strength   AROM / PROM / Strength AROM;PROM;Strength      AROM   Overall AROM Comments R WFL; L Flexion 129  122 abd  ER occiput reach  IR SIJ      PROM   Overall PROM  Deficits    Overall PROM Comments R WFL: L Flexion 133  115 abd  ER 20  IR 50      Strength   Overall Strength Deficits    Overall Strength Comments R 5/5 throughout; L 4+/5 through shoulder and elbow      Palpation   Palpation comment Joint capsular restriction with inf and post glide; hypertonicity of deltoid, biceps, and L UT      Special Tests    Special Tests Rotator Cuff Impingement;Laxity/Instability Tests;Biceps/Labral Tests    Rotator Cuff Impingment tests Neer impingement  test;Hawkins- Merrilyn Puma test;Empty Can test;Speed's test;Painful Arc of Motion    Biceps/Labral tests O' Brien's Test      Neer Impingement test    Findings Negative      Hawkins-Kennedy test   Findings Negative      Empty Can test   Findings Negative      Speed's test   Findings Positive    Side Left      Painful Arc of Motion   Findings Negative      O'Brien's Test   Findings Negative                      Objective measurements completed on examination: See above findings.       [facility] Adult PT Treatment/Exercise - 03/24/20 0001      Exercises   Exercises Shoulder      Shoulder Exercises: Supine   Other Supine Exercises dowel ER 10s 10x      Shoulder Exercises: Seated   Other Seated Exercises seated fwd flexion 10s 10x      Shoulder Exercises: Stretch   Other Shoulder Stretches UT stretch L only 30s 3x      Manual Therapy   Manual Therapy Joint mobilization;Soft tissue mobilization    Joint Mobilization grade II-III LAD with inf and post [place] glide                  PT Education - 03/24/20 0944    Education Details MOI, diagnosis, prognosis, anatomy, posture, work Personal assistant, HEP, POC    Person(s) Educated Patient    Methods Explanation;Demonstration;Handout    Comprehension Verbalized understanding;Returned demonstration            PT Short Term Goals - 03/24/20 1206      PT SHORT TERM GOAL #1   Title Pt will become independent with HEP in order to demonstrate synthesis of PT education.    Time 2    Period Weeks    Status New      PT SHORT TERM GOAL #2   Title Pt will be able to demonstrate BHB reach to C7 and L5 in order to demonstrate functional improvement in L UE function for self-care and house hold duties.    Time 4    Period Weeks    Status New             PT Long Term Goals - 03/24/20 1207      PT LONG TERM  GOAL #1   Title Pt will be able to reach [facility] and grab/reach/hold >10 lbs in order to demonstrate functional  improvement in L UE function for return to full ADL    Time 8    Period Weeks    Status New      PT LONG TERM GOAL #3   Title Pt will have a >15 pt decrease in Quick DASH score in order to demonstrate clinically significant improvement in L UE function.    Time 8    Period Weeks                  Plan - 03/24/20 0946    Clinical Impression Statement Pt is a 50 y.o. male presenting to PT eval today for CC of L shoulder pain. Pt presents with limited L UE ROM, joint capsular stiffness, muscle weakness, and L UE pain. Pt's s/s are consistent with adhesive capsulitis due to capsular restriction pattern and movement presentation. Pt is more pain sensitive at this stage. Pt's impairments currently limit his full participation with ADL, self care, occupation, and house hold tasks. Pt would benefit from continued skilled therapy in order to reach goals, maximize UE function, and progress strength in order to return to PLOF.    Personal Factors and Comorbidities Profession;Fitness    Examination-Activity Limitations Transfers;Bathing;Dressing;Sleep;Hygiene/Grooming;Lift;Carry;Reach Overhead    Examination-Participation Restrictions Occupation;Cleaning;Laundry;Yard Work;Community Activity    Stability/Clinical Decision Making Stable/Uncomplicated    Clinical Decision Making Low    Rehab Potential Good    PT Frequency 2x / week    PT Duration 8 weeks    PT Treatment/Interventions ADLs/Self Care Home Management;Biofeedback;Electrical Stimulation;Iontophoresis 4mg /ml Dexamethasone;Moist Heat;Traction;Ultrasound;Functional mobility training;Therapeutic activities;Therapeutic exercise;Neuromuscular re-education;Patient/family education;Manual techniques;Passive range of motion;Dry needling;Energy conservation;Taping;Joint Manipulations;Spinal Manipulations    PT Next Visit Plan Review HEP, pulleys, wall walk, table ER stretch    PT Home Exercise Plan [email or web address]    Consulted and Agree with Plan of Care  Patient           Patient will benefit from skilled therapeutic intervention in order to improve the following deficits and impairments:  Hypomobility,Increased muscle spasms,Pain,Postural dysfunction,Impaired UE functional use,Impaired flexibility,Decreased strength,Decreased range of motion,Decreased activity tolerance  Visit Diagnosis: Pain in joint of left shoulder  Limitation of joint motion of left shoulder  Muscle weakness (generalized)     Problem List Patient Active Problem List   Diagnosis Date Noted  . Adverse reaction to NSAIDs 12/16/2019  . Elevated PSA 10/21/2019  . Tinnitus of both ears 09/15/2019  . Benign prostatic hyperplasia 09/15/2019  . Obesity 09/15/2019  . Recurrent low back pain 09/13/2018  . Stasis dermatitis 09/13/2018  . Prediabetes 02/03/2017  . Bilateral lower extremity edema 02/02/2017  . OSA (obstructive sleep apnea) 02/02/2017   Daleen Bo PT, DPT 03/24/20 12:12 PM   [facility] [address] [place], [place], [address] Phone: [phone]   Fax:  [phone]  Name: Stephen [name] MRN: [id number] Date of Birth: January 14, 1971

## 2020-03-24 NOTE — Patient Instructions (Signed)
Access Code: [email or web address] URL: [email or web address] Date: 03/24/2020 Prepared by: Daleen Bo  Exercises Supine Shoulder External Rotation in 45 Degrees Abduction AAROM with Dowel - 2 x daily - 7 x weekly - 1 sets - 10 reps - 10 hold Seated Upper Trapezius Stretch - 2 x daily - 7 x weekly - 1 sets - 3 reps - 30 hold Seated Shoulder Flexion Towel Slide at Table Top - 2 x daily - 7 x weekly - 1 sets - 10 reps - 10 hold

## 2020-03-26 ENCOUNTER — Ambulatory Visit (INDEPENDENT_AMBULATORY_CARE_PROVIDER_SITE_OTHER): Payer: BC Managed Care – PPO | Admitting: Physical Therapy

## 2020-03-26 ENCOUNTER — Other Ambulatory Visit: Payer: Self-pay

## 2020-03-26 ENCOUNTER — Encounter: Payer: Self-pay | Admitting: Physical Therapy

## 2020-03-26 DIAGNOSIS — M25612 Stiffness of left shoulder, not elsewhere classified: Secondary | ICD-10-CM

## 2020-03-26 DIAGNOSIS — M6281 Muscle weakness (generalized): Secondary | ICD-10-CM | POA: Diagnosis not present

## 2020-03-26 DIAGNOSIS — M25512 Pain in left shoulder: Secondary | ICD-10-CM

## 2020-03-26 NOTE — Therapy (Signed)
[facility] [place], [place], [address] Phone: [phone]   Fax:  [phone]  Physical Therapy Treatment  Patient Details  Name: Stephen [name] MRN: [id number] Date of Birth: 08-29-1970 Referring Provider (PT): Dr. Georgina Snell   Encounter Date: 03/26/2020   PT End of Session - 03/26/20 0834    Visit Number 2    Number of Visits 17    Date for PT Re-Evaluation 04/23/20    Authorization Type BCBS    PT Start Time 0840    PT Stop Time 0925    PT Time Calculation (min) 45 min    Activity Tolerance Patient tolerated treatment well;Patient limited by pain    Behavior During Therapy [facility] for tasks assessed/performed           Past Medical History:  Diagnosis Date  . Basal cell carcinoma    multiple skin cancers removed  . Degenerative disc disease, lumbar   . Ruptured cervical disc   . Sleep apnea    no cpap    Past Surgical History:  Procedure Laterality Date  . APPENDECTOMY    . EYE MUSCLE SURGERY     2010/2011--lazy  . WISDOM TOOTH EXTRACTION      There were no vitals filed for this visit.   Subjective Assessment - 03/26/20 0833    Subjective Pt states that he is doing about the same as the first session. He reports no problems with HEP and has been compliant with them. He reports no increase in pain and is not in pain currently.    Currently in Pain? No/denies    Multiple Pain Sites No                             [facility] Adult PT Treatment/Exercise - 03/26/20 0001      Posture/Postural Control   Posture/Postural Control Postural limitations    Postural Limitations Increased thoracic kyphosis;Rounded Shoulders      Exercises   Exercises Shoulder      Shoulder Exercises: Supine   Other Supine Exercises dowel ER 10s 10x      Shoulder Exercises: Seated   Other Seated Exercises seated fwd flexion 10s 10x    Other Seated Exercises table ABD and  ER stretch 10s 10x      Shoulder  Exercises: Standing   Flexion AAROM;10 reps   10s with cane     Shoulder Exercises: Pulleys   Flexion 2 minutes    Scaption 2 minutes      Shoulder Exercises: Stretch   Other Shoulder Stretches UT stretch L only 30s 3x      Manual Therapy   Manual Therapy Joint mobilization;Soft tissue mobilization    Joint Mobilization grade II-III LAD with inf and post [place] glide                  PT Education - 03/26/20 0833    Education Details anatomy, postural correction, increased daily movement,  work Personal assistant, Chiropractor) Educated Patient    Methods Explanation;Demonstration    Comprehension Returned demonstration;Verbalized understanding            PT Short Term Goals - 03/24/20 1206      PT SHORT TERM GOAL #1   Title Pt will become independent with HEP in order to demonstrate synthesis of PT education.    Time 2    Period Weeks    Status New  PT SHORT TERM GOAL #2   Title Pt will be able to demonstrate BHB reach to C7 and L5 in order to demonstrate functional improvement in L UE function for self-care and house hold duties.    Time 4    Period Weeks    Status New             PT Long Term Goals - 03/24/20 1207      PT LONG TERM GOAL #1   Title Pt will be able to reach [facility] and grab/reach/hold >10 lbs in order to demonstrate functional improvement in L UE function for return to full ADL    Time 8    Period Weeks    Status New      PT LONG TERM GOAL #3   Title Pt will have a >15 pt decrease in Quick DASH score in order to demonstrate clinically significant improvement in L UE function.    Time 8    Period Weeks                 Plan - 03/26/20 4782    Clinical Impression Statement Pt was able to tolerate increased stretching exercise at today's session. Pt still finds greatest limitation into ER. Pt did have increased ROM after manual therapy after UE warm up. Pt was able to also tolerate open packed position stretching with min UT compensation  with ABD and scaption. Pt would benefit from continued skilled therapy in order to reach goals, maximize UE function, and progress strength in order to return to PLOF.    Personal Factors and Comorbidities Profession;Fitness    Examination-Activity Limitations Transfers;Bathing;Dressing;Sleep;Hygiene/Grooming;Lift;Carry;Reach Overhead    Examination-Participation Restrictions Occupation;Cleaning;Laundry;Yard Work;Community Activity    Stability/Clinical Decision Making Stable/Uncomplicated    Rehab Potential Good    PT Frequency 2x / week    PT Duration 8 weeks    PT Treatment/Interventions ADLs/Self Care Home Management;Biofeedback;Electrical Stimulation;Iontophoresis 4mg /ml Dexamethasone;Moist Heat;Traction;Ultrasound;Functional mobility training;Therapeutic activities;Therapeutic exercise;Neuromuscular re-education;Patient/family education;Manual techniques;Passive range of motion;Dry needling;Energy conservation;Taping;Joint Manipulations;Spinal Manipulations    PT Next Visit Plan Review HEP, arm bike, pulleys, wall wash, horizontal ABD    PT Home Exercise Plan [email or web address]    Consulted and Agree with Plan of Care Patient           Patient will benefit from skilled therapeutic intervention in order to improve the following deficits and impairments:  Hypomobility,Increased muscle spasms,Pain,Postural dysfunction,Impaired UE functional use,Impaired flexibility,Decreased strength,Decreased range of motion,Decreased activity tolerance  Visit Diagnosis: Pain in joint of left shoulder  Limitation of joint motion of left shoulder  Muscle weakness (generalized)     Problem List Patient Active Problem List   Diagnosis Date Noted  . Adverse reaction to NSAIDs 12/16/2019  . Elevated PSA 10/21/2019  . Tinnitus of both ears 09/15/2019  . Benign prostatic hyperplasia 09/15/2019  . Obesity 09/15/2019  . Recurrent low back pain 09/13/2018  . Stasis dermatitis 09/13/2018  . Prediabetes  02/03/2017  . Bilateral lower extremity edema 02/02/2017  . OSA (obstructive sleep apnea) 02/02/2017   Daleen Bo PT, DPT 03/26/20 9:25 AM   [facility] [place], [place], [address] Phone: [phone]   Fax:  [phone]  Name: Cailean Heacock MRN: [id number] Date of Birth: 11-20-1970

## 2020-03-29 DIAGNOSIS — C61 Malignant neoplasm of prostate: Secondary | ICD-10-CM | POA: Diagnosis not present

## 2020-03-30 ENCOUNTER — Ambulatory Visit (INDEPENDENT_AMBULATORY_CARE_PROVIDER_SITE_OTHER): Payer: BC Managed Care – PPO | Admitting: Physical Therapy

## 2020-03-30 ENCOUNTER — Other Ambulatory Visit: Payer: Self-pay

## 2020-03-30 ENCOUNTER — Encounter: Payer: Self-pay | Admitting: Physical Therapy

## 2020-03-30 DIAGNOSIS — M25512 Pain in left shoulder: Secondary | ICD-10-CM

## 2020-03-30 DIAGNOSIS — M6281 Muscle weakness (generalized): Secondary | ICD-10-CM

## 2020-03-30 DIAGNOSIS — M25612 Stiffness of left shoulder, not elsewhere classified: Secondary | ICD-10-CM

## 2020-03-30 NOTE — Patient Instructions (Signed)
Access Code: [email or web address] URL: [email or web address] Date: 03/30/2020 Prepared by: Daleen Bo  Exercises Supine Shoulder External Rotation in 45 Degrees Abduction AAROM with Dowel - 2 x daily - 7 x weekly - 1 sets - 10 reps - 10 hold Seated Upper Trapezius Stretch - 2 x daily - 7 x weekly - 1 sets - 3 reps - 30 hold Seated Shoulder Flexion Towel Slide at Table Top - 2 x daily - 7 x weekly - 1 sets - 10 reps - 10 hold Seated Shoulder External Rotation PROM on Table - 2 x daily - 7 x weekly - 1 sets - 10 reps - 10 hold Seated Shoulder Abduction Towel Slide at Table Top with Forearm in Neutral - 2 x daily - 7 x weekly - 1 sets - 10 reps - 10 hold Standing Shoulder Extension with Dowel - 2 x daily - 7 x weekly - 1 sets - 10 reps - 3 hold Standing Shoulder Internal Rotation Stretch with Towel - 2 x daily - 7 x weekly - 1 sets - 10 reps - 3 hold

## 2020-03-30 NOTE — Therapy (Signed)
[facility] [address] [place], [place], [address] Phone: [phone]   Fax:  [phone]  Physical Therapy Treatment  Patient Details  Name: Stephen [name] MRN: [id number] Date of Birth: 21-Mar-1970 Referring Provider (PT): Dr. Georgina Snell   Encounter Date: 03/30/2020   PT End of Session - 03/30/20 0914    Visit Number 3    Number of Visits 17    Date for PT Re-Evaluation 04/23/20    Authorization Type BCBS    PT Start Time 0803    PT Stop Time 0846    PT Time Calculation (min) 43 min    Activity Tolerance Patient tolerated treatment well    Behavior During Therapy [facility] for tasks assessed/performed           Past Medical History:  Diagnosis Date  . Basal cell carcinoma    multiple skin cancers removed  . Degenerative disc disease, lumbar   . Ruptured cervical disc   . Sleep apnea    no cpap    Past Surgical History:  Procedure Laterality Date  . APPENDECTOMY    . EYE MUSCLE SURGERY     2010/2011--lazy  . WISDOM TOOTH EXTRACTION      There were no vitals filed for this visit.   Subjective Assessment - 03/30/20 0807    Subjective Pt states the shoulder is a little stiff this morning but that is likely due to how he slept. He states he has had no pain and no issues with HEP. He states the shoulder usually becomes looser as the day goes on.    Currently in Pain? No/denies    Multiple Pain Sites No              [facility] PT Assessment - 03/30/20 0001      Posture/Postural Control   Posture/Postural Control Postural limitations    Postural Limitations Increased thoracic kyphosis;Rounded Shoulders                         [facility] Adult PT Treatment/Exercise - 03/30/20 0001      Exercises   Exercises Shoulder      Shoulder Exercises: Seated   Horizontal ABduction 10 reps    Theraband Level (Shoulder Horizontal ABduction) Level 1 (Yellow)    Horizontal ABduction Limitations 2sets    Other Seated Exercises  seated fwd flexion 10s 10x    Other Seated Exercises table ABD and  ER stretch 10s 10x      Shoulder Exercises: Standing   Flexion AAROM;10 reps   10s with cane   Other Standing Exercises wall walk 10x 3s; standing cane ext 10x 3s; IR towel stretch 10s 10x      Shoulder Exercises: Pulleys   Flexion 2 minutes    Scaption 2 minutes               Manual Therapy   Manual Therapy Joint mobilization;Soft tissue mobilization    Joint Mobilization grade III-IV LAD with inf and post [place] glide                  PT Education - 03/30/20 0913    Education Details postural correction, HEP, acceptable levels of pain/discomfort, prognosis    Person(s) Educated Patient    Methods Explanation;Demonstration    Comprehension Verbalized understanding;Returned demonstration            PT Short Term Goals - 03/24/20 1206      PT SHORT TERM GOAL #  1   Title Pt will become independent with HEP in order to demonstrate synthesis of PT education.    Time 2    Period Weeks    Status New      PT SHORT TERM GOAL #2   Title Pt will be able to demonstrate BHB reach to C7 and L5 in order to demonstrate functional improvement in L UE function for self-care and house hold duties.    Time 4    Period Weeks    Status New             PT Long Term Goals - 03/24/20 1207      PT LONG TERM GOAL #1   Title Pt will be able to reach [facility] and grab/reach/hold >10 lbs in order to demonstrate functional improvement in L UE function for return to full ADL    Time 8    Period Weeks    Status New      PT LONG TERM GOAL #3   Title Pt will have a >15 pt decrease in Quick DASH score in order to demonstrate clinically significant improvement in L UE function.    Time 8    Period Weeks                 Plan - 03/30/20 2725    Clinical Impression Statement Pt beginning to progress L UE ROM with improvement in OH reaching. Pt able to increase IR, flexion, and ABD at today's session with greatest  improvement in IR to L4-5 behind back reach. ER behind head reach has also improved to C2-3 region. Pt also able to tolerate grade IV shoulder mobs without pain. Pt required VC and TC for reduced UT compensation during resisted horizontal ABD. Pt would benefit from continued skilled therapy in order to reach goals and maximize L UE ROM and strength for return to PLOF.    Personal Factors and Comorbidities Profession    Examination-Activity Limitations Transfers;Bathing;Dressing;Sleep;Hygiene/Grooming;Lift;Carry;Reach Overhead    Examination-Participation Restrictions Occupation;Cleaning;Laundry;Yard Work;Community Activity    Stability/Clinical Decision Making Stable/Uncomplicated    Rehab Potential Good    PT Frequency 2x / week    PT Duration 8 weeks    PT Treatment/Interventions ADLs/Self Care Home Management;Biofeedback;Electrical Stimulation;Iontophoresis 4mg /ml Dexamethasone;Moist Heat;Traction;Ultrasound;Functional mobility training;Therapeutic activities;Therapeutic exercise;Neuromuscular re-education;Patient/family education;Manual techniques;Passive range of motion;Dry needling;Energy conservation;Taping;Joint Manipulations;Spinal Manipulations    PT Next Visit Plan Review HEP, arm bike, doorway pec stretch, Ir strap stretch , wall walk    PT Home Exercise Plan [email or web address]    Consulted and Agree with Plan of Care Patient           Patient will benefit from skilled therapeutic intervention in order to improve the following deficits and impairments:  Hypomobility,Increased muscle spasms,Pain,Postural dysfunction,Impaired UE functional use,Impaired flexibility,Decreased strength,Decreased range of motion,Decreased activity tolerance  Visit Diagnosis: Pain in joint of left shoulder  Limitation of joint motion of left shoulder  Muscle weakness (generalized)     Problem List Patient Active Problem List   Diagnosis Date Noted  . Adverse reaction to NSAIDs 12/16/2019  . Elevated PSA  10/21/2019  . Tinnitus of both ears 09/15/2019  . Benign prostatic hyperplasia 09/15/2019  . Obesity 09/15/2019  . Recurrent low back pain 09/13/2018  . Stasis dermatitis 09/13/2018  . Prediabetes 02/03/2017  . Bilateral lower extremity edema 02/02/2017  . OSA (obstructive sleep apnea) 02/02/2017    Stephen [name] PT, DPT 03/30/20 9:24 AM   [facility] [place]  [place], [place], [address] Phone: [phone]   Fax:  [phone]  Name: Stephen [name] MRN: [id number] Date of Birth: 1970/06/05

## 2020-04-01 ENCOUNTER — Ambulatory Visit (INDEPENDENT_AMBULATORY_CARE_PROVIDER_SITE_OTHER): Payer: BC Managed Care – PPO | Admitting: Physical Therapy

## 2020-04-01 ENCOUNTER — Other Ambulatory Visit: Payer: Self-pay

## 2020-04-01 ENCOUNTER — Encounter: Payer: Self-pay | Admitting: Physical Therapy

## 2020-04-01 DIAGNOSIS — M25512 Pain in left shoulder: Secondary | ICD-10-CM | POA: Diagnosis not present

## 2020-04-01 DIAGNOSIS — M25612 Stiffness of left shoulder, not elsewhere classified: Secondary | ICD-10-CM

## 2020-04-01 DIAGNOSIS — M6281 Muscle weakness (generalized): Secondary | ICD-10-CM

## 2020-04-01 NOTE — Therapy (Signed)
[facility] [address] [place], [place], [address] Phone: [phone]   Fax:  [phone]  Physical Therapy Treatment  Patient Details  Name: Stephen [name] MRN: [id number] Date of Birth: 1970/10/24 Referring Provider (PT): Dr. Georgina Snell   Encounter Date: 04/01/2020   PT End of Session - 04/01/20 0828    Visit Number 4    Number of Visits 17    Date for PT Re-Evaluation 04/23/20    Authorization Type BCBS    PT Start Time 0805    PT Stop Time 0845    PT Time Calculation (min) 40 min    Activity Tolerance Patient tolerated treatment well    Behavior During Therapy [facility] for tasks assessed/performed           Past Medical History:  Diagnosis Date  . Basal cell carcinoma    multiple skin cancers removed  . Degenerative disc disease, lumbar   . Ruptured cervical disc   . Sleep apnea    no cpap    Past Surgical History:  Procedure Laterality Date  . APPENDECTOMY    . EYE MUSCLE SURGERY     2010/2011--lazy  . WISDOM TOOTH EXTRACTION      There were no vitals filed for this visit.   Subjective Assessment - 04/01/20 0813    Subjective Pt states the shoulder feels better and he was able to sleep on it last night. He did not wake up this morning with pain. He does not currently have pain but he did have a moment of pain when he hit it against the door.                             [facility] Adult PT Treatment/Exercise - 04/01/20 0001      Posture/Postural Control   Posture/Postural Control Postural limitations    Postural Limitations Increased thoracic kyphosis;Rounded Shoulders      Exercises   Exercises Shoulder      Shoulder Exercises: Seated   Row 20 reps    Theraband Level (Shoulder Row) Level 3 (Green)    Horizontal ABduction 10 reps    Theraband Level (Shoulder Horizontal ABduction) Level 1 (Yellow)    Horizontal ABduction Limitations 2sets    Other Seated Exercises seated fwd flexion 10s 10x     Other Seated Exercises table ABD and  ER stretch 10s 10x      Shoulder Exercises: Standing   Flexion AAROM;10 reps   10s with cane   Other Standing Exercises wall walk 10x 3s; standing cane ext 10x 3s; IR towel stretch 10s 10x      Shoulder Exercises: Pulleys   Flexion 3 minutes    Scaption 3 minutes      Shoulder Exercises: Stretch   Corner Stretch 30 seconds;2 reps    Corner Stretch Limitations arm at 45    Other Shoulder Stretches --      Manual Therapy   Manual Therapy Joint mobilization;Soft tissue mobilization    Joint Mobilization grade III-IV LAD with ant inf and post [place] glide                  PT Education - 04/01/20 0834    Education Details HEP, acceptable levels of pain/discomfort, anatomy,    Person(s) Educated Patient    Methods Explanation;Demonstration    Comprehension Verbalized understanding;Returned demonstration            PT Short Term Goals -  03/24/20 1206      PT SHORT TERM GOAL #1   Title Pt will become independent with HEP in order to demonstrate synthesis of PT education.    Time 2    Period Weeks    Status New      PT SHORT TERM GOAL #2   Title Pt will be able to demonstrate BHB reach to C7 and L5 in order to demonstrate functional improvement in L UE function for self-care and house hold duties.    Time 4    Period Weeks    Status New             PT Long Term Goals - 03/24/20 1207      PT LONG TERM GOAL #1   Title Pt will be able to reach [facility] and grab/reach/hold >10 lbs in order to demonstrate functional improvement in L UE function for return to full ADL    Time 8    Period Weeks    Status New      PT LONG TERM GOAL #3   Title Pt will have a >15 pt decrease in Quick DASH score in order to demonstrate clinically significant improvement in L UE function.    Time 8    Period Weeks                 Plan - 04/01/20 4315    Clinical Impression Statement Pt continues to demonstrate improvement with A/PROM at today's  session. Pt is able to improve flexion, IR, and ER motions and tolerates end range grade IV mobilizations without increasing pain. Pt was able to improve ER ROM following ant GHJ mob and doorway pec stretch. Pt still with L UE weakness and ROM deficits with ADL and dressing. Pt would benefit from continued skilled therapy in order to reach goals and maximize L UE ROM and strength for return to PLOF.    Personal Factors and Comorbidities Profession    Examination-Activity Limitations Transfers;Bathing;Dressing;Sleep;Hygiene/Grooming;Lift;Carry;Reach Overhead    Examination-Participation Restrictions Occupation;Cleaning;Laundry;Yard Work;Community Activity    Stability/Clinical Decision Making Stable/Uncomplicated    Rehab Potential Good    PT Frequency 2x / week    PT Duration 8 weeks    PT Treatment/Interventions ADLs/Self Care Home Management;Biofeedback;Electrical Stimulation;Iontophoresis 4mg /ml Dexamethasone;Moist Heat;Traction;Ultrasound;Functional mobility training;Therapeutic activities;Therapeutic exercise;Neuromuscular re-education;Patient/family education;Manual techniques;Passive range of motion;Dry needling;Energy conservation;Taping;Joint Manipulations;Spinal Manipulations    PT Next Visit Plan Review HEP, progress rowing, prone ABD, shoulder ER yellow    PT Home Exercise Plan [email or web address]    Consulted and Agree with Plan of Care Patient           Patient will benefit from skilled therapeutic intervention in order to improve the following deficits and impairments:  Hypomobility,Increased muscle spasms,Pain,Postural dysfunction,Impaired UE functional use,Impaired flexibility,Decreased strength,Decreased range of motion,Decreased activity tolerance  Visit Diagnosis: Pain in joint of left shoulder  Limitation of joint motion of left shoulder  Muscle weakness (generalized)     Problem List Patient Active Problem List   Diagnosis Date Noted  . Adverse reaction to NSAIDs 12/16/2019   . Elevated PSA 10/21/2019  . Tinnitus of both ears 09/15/2019  . Benign prostatic hyperplasia 09/15/2019  . Obesity 09/15/2019  . Recurrent low back pain 09/13/2018  . Stasis dermatitis 09/13/2018  . Prediabetes 02/03/2017  . Bilateral lower extremity edema 02/02/2017  . OSA (obstructive sleep apnea) 02/02/2017    Daleen Bo PT, DPT 04/01/20 8:46 AM   [facility] [place], [place],  [address] Phone: [phone]   Fax:  [phone]  Name: Stephen [name] MRN: [id number] Date of Birth: 03-Feb-1970

## 2020-04-05 ENCOUNTER — Other Ambulatory Visit: Payer: Self-pay

## 2020-04-05 ENCOUNTER — Encounter: Payer: Self-pay | Admitting: Pulmonary Disease

## 2020-04-05 ENCOUNTER — Ambulatory Visit (INDEPENDENT_AMBULATORY_CARE_PROVIDER_SITE_OTHER): Payer: BC Managed Care – PPO | Admitting: Pulmonary Disease

## 2020-04-05 VITALS — BP 122/80 | HR 74 | Temp 98.0°F | Ht 73.0 in | Wt 285.0 lb

## 2020-04-05 DIAGNOSIS — G4733 Obstructive sleep apnea (adult) (pediatric): Secondary | ICD-10-CM | POA: Diagnosis not present

## 2020-04-05 NOTE — Progress Notes (Signed)
Stephen [name]    [id number]    1970/11/03  Primary Care Physician:Parker, Algis Greenhouse, MD  Referring Physician: Vivi Barrack, [name],  [place] [address]  Chief complaint:   Patient being seen for snoring, daytime sleepiness  HPI:  Has had 3 studies in the past the last one was in 2019 showing an AHI of 5  Has used an oral device in the past-did not help snoring, did not help interrupted sleep Has used several over-the-counter devices to no avail  Was recently seen by ENT for same problems  Loud snoring, nonrestorative sleep Has a lot of nasal congestion usually at night Not congested during the day  No underlying history of lung disease No family history of sleep apnea  Wakes up nonrestorative, tired during the day usually goes to bed about 10 PM to 11 PM Falls asleep in 5 to 10 minutes 3-4 awakenings Final wake up time about 7 AM  His weight is about the same as it was a few years back when he had his last study  Currently dealing with new diagnosis of prostate cancer and shoulder pain and discomfort  He has a dog, no known allergies   Outpatient Encounter Medications as of 04/05/2020  Medication Sig  . cyclobenzaprine (FLEXERIL) 10 MG tablet TAKE 1 TABLET BY MOUTH AS NEEDED FOR MUSCLE SPASMS.  . Multiple Vitamin (MULTIVITAMIN PO) Take by mouth.  . tamsulosin (FLOMAX) 0.4 MG CAPS capsule Take 1 capsule (0.4 mg total) by mouth daily.  . [DISCONTINUED] fluticasone (FLONASE) 50 MCG/ACT nasal spray Place 1 spray into both nostrils daily as needed. (Patient not taking: Reported on 03/24/2020)  . [DISCONTINUED] liraglutide (VICTOZA) 18 MG/3ML SOPN Start 0.6mg  SQ once a day for 7 days, then increase to 1.2mg  once a day (Patient not taking: Reported on 03/24/2020)   No facility-administered encounter medications on file as of 04/05/2020.    Allergies as of 04/05/2020 - Review Complete 04/05/2020  Allergen Reaction Noted  . Penicillins Nausea Only  09/01/2016    Past Medical History:  Diagnosis Date  . Basal cell carcinoma    multiple skin cancers removed  . Degenerative disc disease, lumbar   . Ruptured cervical disc   . Sleep apnea    no cpap    Past Surgical History:  Procedure Laterality Date  . APPENDECTOMY    . EYE MUSCLE SURGERY     2010/2011--lazy  . WISDOM TOOTH EXTRACTION      Family History  Problem Relation Age of Onset  . Hypertension Mother   . Ovarian cancer Maternal Grandmother 59  . Esophageal cancer Maternal Grandfather 73  . Colon cancer Neg Hx   . Colon polyps Neg Hx   . Rectal cancer Neg Hx   . Stomach cancer Neg Hx     Social History   Socioeconomic History  . Marital status: Married    Spouse name: Not on file  . Number of children: Not on file  . Years of education: Not on file  . Highest education level: Not on file  Occupational History  . Not on file  Tobacco Use  . Smoking status: Never Smoker  . Smokeless tobacco: Never Used  Vaping Use  . Vaping Use: Never used  Substance and Sexual Activity  . Alcohol use: Yes    Alcohol/week: 4.0 standard drinks    Types: 4 Standard drinks or equivalent per week    Comment: Socially  .  Drug use: No  . Sexual activity: Yes    Partners: Female  Other Topics Concern  . Not on file  Social History Narrative  . Not on file   Social Determinants of Health   Financial Resource Strain: Not on file  Food Insecurity: Not on file  Transportation Needs: Not on file  Physical Activity: Not on file  Stress: Not on file  Social Connections: Not on file  Intimate Partner Violence: Not on file    Review of Systems  Constitutional: Positive for fatigue.  HENT:       Nasal stuffiness  Respiratory: Negative for shortness of breath.   Psychiatric/Behavioral: Positive for sleep disturbance.    Vitals:   04/05/20 0953  BP: 122/80  Pulse: 74  Temp: 98 F (36.7 C)  SpO2: 99%     Physical Exam Constitutional:      Appearance: He is  obese.  HENT:     Head: Normocephalic.     Nose: No congestion.     Mouth/Throat:     Mouth: Mucous membranes are moist.     Comments: Mallampati 3, crowded oropharynx Eyes:     General: No scleral icterus.       Right eye: No discharge.        Left eye: No discharge.  Cardiovascular:     Rate and Rhythm: Normal rate and regular rhythm.     Heart sounds: No murmur heard. No friction rub.  Pulmonary:     Effort: No respiratory distress.     Breath sounds: No stridor. No wheezing or rhonchi.  Musculoskeletal:     Cervical back: No rigidity or tenderness.  Neurological:     Mental Status: He is alert.  Psychiatric:        Mood and Affect: Mood normal.    Epworth Sleepiness Scale of 9  Data Reviewed: Last sleep study from February 2019 reviewed by myself showing an AHI of 5  Assessment:  Loud snoring  Nonrestrictive sleep  Obesity  Moderate probability of significant obstructive sleep apnea Pathophysiology of sleep disordered breathing reviewed with the patient Treatment options reviewed  Plan/Recommendations: Schedule the patient for an in lab polysomnogram  Importance of diet and exercise discussed  Tentative follow-up in about 3 months  Encouraged to call with any significant concerns   Sherrilyn Rist MD [facility] 04/05/2020, 9:57 AM  CC: Vivi Barrack, MD

## 2020-04-05 NOTE — Patient Instructions (Signed)
We will schedule you for an in lab polysomnogram  Tentative follow-up in 3 months  Weight loss efforts -Diet and exercise as best as you can  Call with significant concerns Sleep Apnea Sleep apnea affects breathing during sleep. It causes breathing to stop for a short time or to become shallow. It can also increase the risk of:  Heart attack.  Stroke.  Being very overweight (obese).  Diabetes.  Heart failure.  Irregular heartbeat. The goal of treatment is to help you breathe normally again. What are the causes? There are three kinds of sleep apnea:  Obstructive sleep apnea. This is caused by a blocked or collapsed airway.  Central sleep apnea. This happens when the brain does not send the right signals to the muscles that control breathing.  Mixed sleep apnea. This is a combination of obstructive and central sleep apnea. The most common cause of this condition is a collapsed or blocked airway. This can happen if:  Your throat muscles are too relaxed.  Your tongue and tonsils are too large.  You are overweight.  Your airway is too small.   What increases the risk?  Being overweight.  Smoking.  Having a small airway.  Being older.  Being male.  Drinking alcohol.  Taking medicines to calm yourself (sedatives or tranquilizers).  Having family members with the condition. What are the signs or symptoms?  Trouble staying asleep.  Being sleepy or tired during the day.  Getting angry a lot.  Loud snoring.  Headaches in the morning.  Not being able to focus your mind (concentrate).  Forgetting things.  Less interest in sex.  Mood swings.  Personality changes.  Feelings of sadness (depression).  Waking up a lot during the night to pee (urinate).  Dry mouth.  Sore throat. How is this diagnosed?  Your medical history.  A physical exam.  A test that is done when you are sleeping (sleep study). The test is most often done in a sleep lab but  may also be done at home. How is this treated?  Sleeping on your side.  Using a medicine to get rid of mucus in your nose (decongestant).  Avoiding the use of alcohol, medicines to help you relax, or certain pain medicines (narcotics).  Losing weight, if needed.  Changing your diet.  Not smoking.  Using a machine to open your airway while you sleep, such as: ? An oral appliance. This is a mouthpiece that shifts your lower jaw forward. ? A CPAP device. This device blows air through a mask when you breathe out (exhale). ? An EPAP device. This has valves that you put in each nostril. ? A BPAP device. This device blows air through a mask when you breathe in (inhale) and breathe out.  Having surgery if other treatments do not work. It is important to get treatment for sleep apnea. Without treatment, it can lead to:  High blood pressure.  Coronary artery disease.  In men, not being able to have an erection (impotence).  Reduced thinking ability.   Follow these instructions at home: Lifestyle  Make changes that your doctor recommends.  Eat a healthy diet.  Lose weight if needed.  Avoid alcohol, medicines to help you relax, and some pain medicines.  Do not use any products that contain nicotine or tobacco, such as cigarettes, e-cigarettes, and chewing tobacco. If you need help quitting, ask your doctor. General instructions  Take over-the-counter and prescription medicines only as told by your doctor.  If  you were given a machine to use while you sleep, use it only as told by your doctor.  If you are having surgery, make sure to tell your doctor you have sleep apnea. You may need to bring your device with you.  Keep all follow-up visits as told by your doctor. This is important. Contact a doctor if:  The machine that you were given to use during sleep bothers you or does not seem to be working.  You do not get better.  You get worse. Get help right away if:  Your  chest hurts.  You have trouble breathing in enough air.  You have an uncomfortable feeling in your back, arms, or stomach.  You have trouble talking.  One side of your body feels weak.  A part of your face is hanging down. These symptoms may be an emergency. Do not wait to see if the symptoms will go away. Get medical help right away. Call your local emergency services (911 in the [place] Do not drive yourself to the hospital. Summary  This condition affects breathing during sleep.  The most common cause is a collapsed or blocked airway.  The goal of treatment is to help you breathe normally while you sleep. This information is not intended to replace advice given to you by your health care provider. Make sure you discuss any questions you have with your health care provider. Document Revised: 10/19/2017 Document Reviewed: 08/28/2017 Elsevier Patient Education  [place].

## 2020-04-06 ENCOUNTER — Ambulatory Visit (INDEPENDENT_AMBULATORY_CARE_PROVIDER_SITE_OTHER): Payer: BC Managed Care – PPO | Admitting: Physical Therapy

## 2020-04-06 ENCOUNTER — Encounter: Payer: Self-pay | Admitting: Physical Therapy

## 2020-04-06 DIAGNOSIS — M6281 Muscle weakness (generalized): Secondary | ICD-10-CM | POA: Diagnosis not present

## 2020-04-06 DIAGNOSIS — M25512 Pain in left shoulder: Secondary | ICD-10-CM

## 2020-04-06 DIAGNOSIS — M25612 Stiffness of left shoulder, not elsewhere classified: Secondary | ICD-10-CM

## 2020-04-06 NOTE — Therapy (Signed)
[facility] [address] [place], [place], [address] Phone: [phone]   Fax:  [phone]  Physical Therapy Treatment  Patient Details  Name: Stephen [name] MRN: [id number] Date of Birth: [birth year] Referring Provider (PT): Dr. Georgina Snell   Encounter Date: 04/06/2020   PT End of Session - 04/06/20 0807    Visit Number 5    Number of Visits 17    Date for PT Re-Evaluation 04/23/20    Authorization Type BCBS    PT Start Time 0803    PT Stop Time 0845    PT Time Calculation (min) 42 min    Activity Tolerance Patient tolerated treatment well    Behavior During Therapy [facility] for tasks assessed/performed           Past Medical History:  Diagnosis Date  . Basal cell carcinoma    multiple skin cancers removed  . Degenerative disc disease, lumbar   . Ruptured cervical disc   . Sleep apnea    no cpap    Past Surgical History:  Procedure Laterality Date  . APPENDECTOMY    . EYE MUSCLE SURGERY     2010/2011--lazy  . WISDOM TOOTH EXTRACTION      There were no vitals filed for this visit.   Subjective Assessment - 04/06/20 0805    Subjective Pt states the shoulder is a little stiff from this weekend. He was a little more busy with yardwork but did not have much trouble or pain during. Pt states reaching BHB for his belt is still the most difficult.    Currently in Pain? No/denies                             [name] Adult PT Treatment/Exercise - 04/06/20 0001      Posture/Postural Control   Posture/Postural Control Postural limitations    Postural Limitations Increased thoracic kyphosis;Rounded Shoulders      Exercises   Exercises Shoulder      Shoulder Exercises: Seated   Row 20 reps    Theraband Level (Shoulder Row) Level 4 (Blue)    Horizontal ABduction 10 reps    Theraband Level (Shoulder Horizontal ABduction) Level 1 (Yellow)    Horizontal ABduction Limitations 2sets    External Rotation 20 reps     Theraband Level (Shoulder External Rotation) Level 1 (Yellow)    Other Seated Exercises --          Shoulder Exercises: Sidelying   External Rotation Weight (lbs) 2    External Rotation Limitations towel under arm 2x10      Shoulder Exercises: Standing   Flexion --    Other Standing Exercises  standing cane ext 10x 3s; IR towel stretch 10s 10x      Shoulder Exercises: Pulleys   Flexion 3 minutes    Scaption 3 minutes      Shoulder Exercises: Stretch   Corner Stretch --    Corner Stretch Limitations --      Manual Therapy   Manual Therapy Joint mobilization;Soft tissue mobilization    Joint Mobilization grade III-IV LAD with ant (prone) inf and post [place] glide                  PT Education - 04/06/20 0916    Education Details HEP, acceptable levels of pain/discomfort, anatomy, exercise progression    Person(s) Educated Patient    Methods Explanation;Demonstration;Tactile cues;Verbal cues    Comprehension Verbalized understanding;Returned demonstration  PT Short Term Goals - 03/24/20 1206      PT SHORT TERM GOAL #1   Title Pt will become independent with HEP in order to demonstrate synthesis of PT education.    Time 2    Period Weeks    Status New      PT SHORT TERM GOAL #2   Title Pt will be able to demonstrate BHB reach to C7 and L5 in order to demonstrate functional improvement in L UE function for self-care and house hold duties.    Time 4    Period Weeks    Status New             PT Long Term Goals - 03/24/20 1207      PT LONG TERM GOAL #1   Title Pt will be able to reach [facility] and grab/reach/hold >10 lbs in order to demonstrate functional improvement in L UE function for return to full ADL    Time 8    Period Weeks    Status New      PT LONG TERM GOAL #3   Title Pt will have a >15 pt decrease in Quick DASH score in order to demonstrate clinically significant improvement in L UE function.    Time 8    Period Weeks                  Plan - 04/06/20 2122    Clinical Impression Statement Pt was able to progress and improve PROM today with joint mobilizations and exercise. Pt was able to tolerate restisted shoulder movements as well as incorporate manual PNF stretching to promote cuff relaxation. Add strengthening to HEP at next session. Pt still with L UE weakness and ROM deficits with ADL and dressing. Cuing required for scapular setting and reduced compensation during OH reaching. Pt would benefit from continued skilled therapy in order to reach goals and maximize L UE ROM and strength for return to PLOF.    Personal Factors and Comorbidities Profession    Examination-Activity Limitations Transfers;Bathing;Dressing;Sleep;Hygiene/Grooming;Lift;Carry;Reach Overhead    Examination-Participation Restrictions Occupation;Cleaning;Laundry;Yard Work;Community Activity    Stability/Clinical Decision Making Stable/Uncomplicated    Rehab Potential Good    PT Frequency 2x / week    PT Duration 8 weeks    PT Treatment/Interventions ADLs/Self Care Home Management;Biofeedback;Electrical Stimulation;Iontophoresis 4mg /ml Dexamethasone;Moist Heat;Traction;Ultrasound;Functional mobility training;Therapeutic activities;Therapeutic exercise;Neuromuscular re-education;Patient/family education;Manual techniques;Passive range of motion;Dry needling;Energy conservation;Taping;Joint Manipulations;Spinal Manipulations    PT Next Visit Plan Review HEP, rowing, S/L ER, standing ER, standing IR    PT Home Exercise Plan [email or web address]    Consulted and Agree with Plan of Care Patient           Patient will benefit from skilled therapeutic intervention in order to improve the following deficits and impairments:  Hypomobility,Increased muscle spasms,Pain,Postural dysfunction,Impaired UE functional use,Impaired flexibility,Decreased strength,Decreased range of motion,Decreased activity tolerance  Visit Diagnosis: Pain in joint of left  shoulder  Limitation of joint motion of left shoulder  Muscle weakness (generalized)     Problem List Patient Active Problem List   Diagnosis Date Noted  . Adverse reaction to NSAIDs 12/16/2019  . Elevated PSA 10/21/2019  . Tinnitus of both ears 09/15/2019  . Benign prostatic hyperplasia 09/15/2019  . Obesity 09/15/2019  . Recurrent low back pain 09/13/2018  . Stasis dermatitis 09/13/2018  . Prediabetes 02/03/2017  . Bilateral lower extremity edema 02/02/2017  . OSA (obstructive sleep apnea) 02/02/2017   Daleen Bo PT, DPT 04/06/20 9:18 AM   [facility] [address] [place], [place], [address] Phone: [phone]   Fax:  [phone]  Name: Stephen [name] MRN: [id number] Date of Birth: 1970/10/24

## 2020-04-08 ENCOUNTER — Other Ambulatory Visit: Payer: Self-pay

## 2020-04-08 ENCOUNTER — Ambulatory Visit (INDEPENDENT_AMBULATORY_CARE_PROVIDER_SITE_OTHER): Payer: BC Managed Care – PPO | Admitting: Physical Therapy

## 2020-04-08 ENCOUNTER — Encounter: Payer: Self-pay | Admitting: Physical Therapy

## 2020-04-08 DIAGNOSIS — M25612 Stiffness of left shoulder, not elsewhere classified: Secondary | ICD-10-CM | POA: Diagnosis not present

## 2020-04-08 DIAGNOSIS — M6281 Muscle weakness (generalized): Secondary | ICD-10-CM

## 2020-04-08 DIAGNOSIS — M25512 Pain in left shoulder: Secondary | ICD-10-CM

## 2020-04-08 NOTE — Patient Instructions (Signed)
Access Code: [email or web address] URL: [email or web address] Date: 04/08/2020 Prepared by: Daleen Bo  Exercises Seated Upper Trapezius Stretch - 2 x daily - 7 x weekly - 1 sets - 3 reps - 30 hold Standing Shoulder Flexion Wall Walk - 1 x daily - 7 x weekly - 2 sets - 10 reps - 5 hold Seated Shoulder External Rotation PROM on Table - 2 x daily - 7 x weekly - 1 sets - 10 reps - 10 hold Standing Shoulder Extension with [place] - 2 x daily - 7 x weekly - 1 sets - 10 reps - 3 hold Standing Shoulder Internal Rotation Stretch with Towel - 2 x daily - 7 x weekly - 1 sets - 10 reps - 10 hold Standing Shoulder Row with Anchored Resistance - 1 x daily - 7 x weekly - 3 sets - 10 reps Shoulder External Rotation and Scapular Retraction with Resistance - 1 x daily - 7 x weekly - 2 sets - 10 reps Shoulder Internal Rotation with Resistance - 1 x daily - 7 x weekly - 2 sets - 10 reps Doorway Pec Stretch at 60 Degrees Abduction with Arm Straight - 2 x daily - 7 x weekly - 1 sets - 3 reps - 30 hold

## 2020-04-08 NOTE — Therapy (Signed)
[facility] [address] [place], [place], [address] Phone: [phone]   Fax:  [phone]  Physical Therapy Treatment  Patient Details  Name: Stephen [name] MRN: [id number] Date of Birth: 1970/08/16 Referring Provider (PT): Dr. Georgina Snell   Encounter Date: 04/08/2020   PT End of Session - 04/08/20 0809    Visit Number 6    Number of Visits 17    Date for PT Re-Evaluation 04/23/20    Authorization Type BCBS    PT Start Time 0802    PT Stop Time 0845    PT Time Calculation (min) 43 min    Activity Tolerance Patient tolerated treatment well    Behavior During Therapy [facility] for tasks assessed/performed           Past Medical History:  Diagnosis Date  . Basal cell carcinoma    multiple skin cancers removed  . Degenerative disc disease, lumbar   . Ruptured cervical disc   . Sleep apnea    no cpap    Past Surgical History:  Procedure Laterality Date  . APPENDECTOMY    . EYE MUSCLE SURGERY     2010/2011--lazy  . WISDOM TOOTH EXTRACTION      There were no vitals filed for this visit.   Subjective Assessment - 04/08/20 0805    Subjective Pt reports that his shoulder is still slowly getting better. He had no compliants of soreness or pain after last session. The shoulder continues to be stiff in the morning but gets better as he moves during the day. He is able to sleep onto the L side now but it depends on the position. Higher up and across the body is not as much of a pulling sensation.    Currently in Pain? No/denies    Multiple Pain Sites No            AROM L shoulder flexion to brow ridge ABD -30 deg compared to R IR BHB reach L5 ER BHB reach C7                 [facility] Adult PT Treatment/Exercise - 04/08/20 0001      Posture/Postural Control   Posture/Postural Control Postural limitations    Postural Limitations Increased thoracic kyphosis;Rounded Shoulders      Exercises   Exercises Shoulder       Shoulder Exercises: Seated   Row 20 reps    Theraband Level (Shoulder Row) Level 4 (Blue)    Horizontal ABduction 10 reps    Theraband Level (Shoulder Horizontal ABduction) Level 2 (Red)    Horizontal ABduction Limitations 2sets    External Rotation 20 reps    Theraband Level (Shoulder External Rotation) Level 2 (Red)    Internal Rotation 20 reps    Theraband Level (Shoulder Internal Rotation) Level 4 (Blue)    Other Seated Exercises --      Shoulder Exercises: Sidelying   External Rotation Weight (lbs) 2    External Rotation Limitations towel under arm 2x10      Shoulder Exercises: Standing   Other Standing Exercises standing cane ext 10x 3s; IR towel stretch 10s 10x      Shoulder Exercises: Pulleys   Flexion 3 minutes    Scaption --    ABduction 3 minutes      Manual Therapy   Manual Therapy Joint mobilization;Soft tissue mobilization    Joint Mobilization grade III-IV LAD with ant (prone) inf and post GH glide  PT Education - 04/08/20 0807    Education Details HEP, acceptable levels of pain/discomfort, exercise progression    Person(s) Educated Patient    Methods Explanation;Demonstration;Tactile cues;Verbal cues    Comprehension Verbalized understanding;Returned demonstration            PT Short Term Goals - 03/24/20 1206      PT SHORT TERM GOAL #1   Title Pt will become independent with HEP in order to demonstrate synthesis of PT education.    Time 2    Period Weeks    Status New      PT SHORT TERM GOAL #2   Title Pt will be able to demonstrate BHB reach to C7 and L5 in order to demonstrate functional improvement in L UE function for self-care and house hold duties.    Time 4    Period Weeks    Status New             PT Long Term Goals - 03/24/20 1207      PT LONG TERM GOAL #1   Title Pt will be able to reach [facility] and grab/reach/hold >10 lbs in order to demonstrate functional improvement in L UE function for return to full ADL     Time 8    Period Weeks    Status New      PT LONG TERM GOAL #3   Title Pt will have a >15 pt decrease in Quick DASH score in order to demonstrate clinically significant improvement in L UE function.    Time 8    Period Weeks                 Plan - 04/08/20 9562    Clinical Impression Statement Pt demonstrates signficant improvement into AROM flexion and ABD at today's session. Pt had improved ROM after grade IV mob. ER is still limited with behind head reaching and IR behind back reach is still most limited due to combined extension and IR. Pt was able to tolerate increased band resistance with exercise and gave verbal understanding to new HEP update. Pt would benefit from continued skilled therapy in order to reach goals and maximize L UE ROM and strength for return to PLOF.    Personal Factors and Comorbidities Profession    Examination-Activity Limitations Transfers;Bathing;Dressing;Sleep;Hygiene/Grooming;Lift;Carry;Reach Overhead    Examination-Participation Restrictions Occupation;Cleaning;Laundry;Yard Work;Community Activity    Stability/Clinical Decision Making Stable/Uncomplicated    Rehab Potential Good    PT Frequency 2x / week    PT Duration 8 weeks    PT Treatment/Interventions ADLs/Self Care Home Management;Biofeedback;Electrical Stimulation;Iontophoresis 4mg /ml Dexamethasone;Moist Heat;Traction;Ultrasound;Functional mobility training;Therapeutic activities;Therapeutic exercise;Neuromuscular re-education;Patient/family education;Manual techniques;Passive range of motion;Dry needling;Energy conservation;Taping;Joint Manipulations;Spinal Manipulations    PT Next Visit Plan Review HEP, rowing, S/L ER, standing ER, standing IR    PT Home Exercise Plan [email or web address]    Consulted and Agree with Plan of Care Patient           Patient will benefit from skilled therapeutic intervention in order to improve the following deficits and impairments:  Hypomobility,Increased muscle  spasms,Pain,Postural dysfunction,Impaired UE functional use,Impaired flexibility,Decreased strength,Decreased range of motion,Decreased activity tolerance  Visit Diagnosis: Pain in joint of left shoulder  Limitation of joint motion of left shoulder  Muscle weakness (generalized)     Problem List Patient Active Problem List   Diagnosis Date Noted  . Adverse reaction to NSAIDs 12/16/2019  . Elevated PSA 10/21/2019  . Tinnitus of both ears 09/15/2019  . Benign prostatic hyperplasia 09/15/2019  .  Obesity 09/15/2019  . Recurrent low back pain 09/13/2018  . Stasis dermatitis 09/13/2018  . Prediabetes 02/03/2017  . Bilateral lower extremity edema 02/02/2017  . OSA (obstructive sleep apnea) 02/02/2017   Daleen Bo PT, DPT 04/08/20 8:50 AM   [facility] [place], [place], [address] Phone: [phone]   Fax:  [phone]  Name: Severino Paolo MRN: [id number] Date of Birth: 30-Jan-1970

## 2020-04-13 ENCOUNTER — Encounter: Payer: Self-pay | Admitting: Physical Therapy

## 2020-04-13 ENCOUNTER — Ambulatory Visit (INDEPENDENT_AMBULATORY_CARE_PROVIDER_SITE_OTHER): Payer: BC Managed Care – PPO | Admitting: Physical Therapy

## 2020-04-13 ENCOUNTER — Other Ambulatory Visit: Payer: Self-pay

## 2020-04-13 DIAGNOSIS — M25512 Pain in left shoulder: Secondary | ICD-10-CM

## 2020-04-13 DIAGNOSIS — M6281 Muscle weakness (generalized): Secondary | ICD-10-CM | POA: Diagnosis not present

## 2020-04-13 DIAGNOSIS — M25612 Stiffness of left shoulder, not elsewhere classified: Secondary | ICD-10-CM

## 2020-04-13 NOTE — Therapy (Signed)
[facility] [address] [place], [place], [address] Phone: [phone]   Fax:  [phone]  Physical Therapy Treatment  Patient Details  Name: Stephen [name] MRN: [id number] Date of Birth: [birth year] Referring Provider (PT): Dr. Georgina Snell   Encounter Date: 04/13/2020   PT End of Session - 04/13/20 0857    Visit Number 7    Number of Visits 17    Date for PT Re-Evaluation 04/23/20    Authorization Type BCBS    PT Start Time 0803    PT Stop Time 0847    PT Time Calculation (min) 44 min    Activity Tolerance Patient tolerated treatment well    Behavior During Therapy [facility] for tasks assessed/performed           Past Medical History:  Diagnosis Date  . Basal cell carcinoma    multiple skin cancers removed  . Degenerative disc disease, lumbar   . Ruptured cervical disc   . Sleep apnea    no cpap    Past Surgical History:  Procedure Laterality Date  . APPENDECTOMY    . EYE MUSCLE SURGERY     2010/2011--lazy  . WISDOM TOOTH EXTRACTION      There were no vitals filed for this visit.   Subjective Assessment - 04/13/20 0833    Subjective Pt reports no new complaints. He still feels that reaching behind his back towards his beltline is still the most limited.    Currently in Pain? No/denies    Multiple Pain Sites No                             [facility] Adult PT Treatment/Exercise - 04/13/20 0001      Posture/Postural Control   Posture/Postural Control Postural limitations    Postural Limitations Increased thoracic kyphosis;Rounded Shoulders      Exercises   Exercises Shoulder                         Horizontal ABduction Limitations 2sets    External Rotation 20 reps    Theraband Level (Shoulder External Rotation) Level 2 (Red)                                  Other Standing Exercises standing IR/ER with RTB 15x each; standing arm circles fwd and back 10x      Shoulder Exercises: Pulleys    Flexion 3 minutes    ABduction 3 minutes      Shoulder Exercises: Stretch   Corner Stretch 2 reps;30 seconds    Corner Stretch Limitations arm at 60    Internal Rotation Stretch 10 seconds    Internal Rotation Stretch Limitations 10x 3x      Manual Therapy   Manual Therapy Joint mobilization;Soft tissue mobilization    Joint Mobilization grade III-IV LAD with ant (prone) inf and post GH glide    Soft tissue mobilization L and detloid and biceps                  PT Education - 04/13/20 0856    Education Details HEP update, eccentric relaxation emphasis, anatomy    Person(s) Educated Patient    Methods Explanation;Demonstration;Tactile cues    Comprehension Verbalized understanding;Returned demonstration            PT Short Term Goals - 03/24/20 1206  PT SHORT TERM GOAL #1   Title Pt will become independent with HEP in order to demonstrate synthesis of PT education.    Time 2    Period Weeks    Status New      PT SHORT TERM GOAL #2   Title Pt will be able to demonstrate BHB reach to C7 and L5 in order to demonstrate functional improvement in L UE function for self-care and house hold duties.    Time 4    Period Weeks    Status New             PT Long Term Goals - 03/24/20 1207      PT LONG TERM GOAL #1   Title Pt will be able to reach [facility] and grab/reach/hold >10 lbs in order to demonstrate functional improvement in L UE function for return to full ADL    Time 8    Period Weeks    Status New      PT LONG TERM GOAL #3   Title Pt will have a >15 pt decrease in Quick DASH score in order to demonstrate clinically significant improvement in L UE function.    Time 8    Period Weeks                 Plan - 04/13/20 2993    Clinical Impression Statement Pt presented with increased anterior deltoid and biceps muscle spasm at today's session that was relieved with STM and joint mobilization. Pt was able to perform IR behind back to L4 AROM vs PROM at  beginning of session. Pt's HEP was progressed to stronger band resistance and emphasis was placed on slow eccentric lowering to facilitate neuromuscular relaxation. Pt's stretching and PROM exercises were also updated to promote more open pack position stretching. Pt would benefit from continued skilled therapy in order to reach goals and maximize L UE ROM and strength for return to PLOF.    Personal Factors and Comorbidities Profession    Examination-Activity Limitations Transfers;Bathing;Dressing;Sleep;Hygiene/Grooming;Lift;Carry;Reach Overhead    Examination-Participation Restrictions Occupation;Cleaning;Laundry;Yard Work;Community Activity    Stability/Clinical Decision Making Stable/Uncomplicated    Rehab Potential Good    PT Frequency 2x / week    PT Duration 8 weeks    PT Treatment/Interventions ADLs/Self Care Home Management;Biofeedback;Electrical Stimulation;Iontophoresis 4mg /ml Dexamethasone;Moist Heat;Traction;Ultrasound;Functional mobility training;Therapeutic activities;Therapeutic exercise;Neuromuscular re-education;Patient/family education;Manual techniques;Passive range of motion;Dry needling;Energy conservation;Taping;Joint Manipulations;Spinal Manipulations    PT Next Visit Plan Review HEP, prone ABD, prone extension, UBE    PT Home Exercise Plan [email or web address]    Consulted and Agree with Plan of Care Patient           Patient will benefit from skilled therapeutic intervention in order to improve the following deficits and impairments:  Hypomobility,Increased muscle spasms,Pain,Postural dysfunction,Impaired UE functional use,Impaired flexibility,Decreased strength,Decreased range of motion,Decreased activity tolerance  Visit Diagnosis: Pain in joint of left shoulder  Limitation of joint motion of left shoulder  Muscle weakness (generalized)     Problem List Patient Active Problem List   Diagnosis Date Noted  . Adverse reaction to NSAIDs 12/16/2019  . Elevated PSA  10/21/2019  . Tinnitus of both ears 09/15/2019  . Benign prostatic hyperplasia 09/15/2019  . Obesity 09/15/2019  . Recurrent low back pain 09/13/2018  . Stasis dermatitis 09/13/2018  . Prediabetes 02/03/2017  . Bilateral lower extremity edema 02/02/2017  . OSA (obstructive sleep apnea) 02/02/2017   Daleen Bo PT, DPT 04/13/20 9:02 AM   [facility]  [place], [place], [address] Phone: [phone]   Fax:  [phone]  Name: Stephen [name] MRN: [id number] Date of Birth: 01/22/1970

## 2020-04-13 NOTE — Patient Instructions (Signed)
Access Code: [email or web address] URL: [email or web address] Date: 04/13/2020 Prepared by: Daleen Bo  Exercises Standing Shoulder Flexion Wall Walk - 1 x daily - 7 x weekly - 2 sets - 10 reps - 5 hold Standing Shoulder Row with Anchored Resistance - 1 x daily - 7 x weekly - 3 sets - 10 reps Shoulder Internal Rotation with Resistance - 1 x daily - 7 x weekly - 2 sets - 10 reps Shoulder External Rotation and Scapular Retraction with Resistance - 1 x daily - 7 x weekly - 2 sets - 10 reps Standing Shoulder Posterior Capsule Stretch - 1 x daily - 7 x weekly - 3 sets - 10 reps Seated Shoulder External Rotation PROM on Table - 2 x daily - 7 x weekly - 1 sets - 10 reps - 10 hold Standing Shoulder Extension with Dowel - 2 x daily - 7 x weekly - 1 sets - 10 reps - 3 hold Standing Shoulder Internal Rotation Stretch with Towel - 2 x daily - 7 x weekly - 1 sets - 10 reps - 10 hold Single Arm Doorway Pec Stretch at 90 Degrees Abduction - 1 x daily - 7 x weekly - 1 sets - 3 reps - 30 hold

## 2020-04-14 DIAGNOSIS — I700 Atherosclerosis of aorta: Secondary | ICD-10-CM | POA: Diagnosis not present

## 2020-04-14 DIAGNOSIS — R590 Localized enlarged lymph nodes: Secondary | ICD-10-CM | POA: Diagnosis not present

## 2020-04-14 DIAGNOSIS — C61 Malignant neoplasm of prostate: Secondary | ICD-10-CM | POA: Diagnosis not present

## 2020-04-15 ENCOUNTER — Other Ambulatory Visit: Payer: Self-pay

## 2020-04-15 ENCOUNTER — Encounter: Payer: Self-pay | Admitting: Physical Therapy

## 2020-04-15 ENCOUNTER — Ambulatory Visit (INDEPENDENT_AMBULATORY_CARE_PROVIDER_SITE_OTHER): Payer: BC Managed Care – PPO | Admitting: Physical Therapy

## 2020-04-15 DIAGNOSIS — M6281 Muscle weakness (generalized): Secondary | ICD-10-CM

## 2020-04-15 DIAGNOSIS — M25512 Pain in left shoulder: Secondary | ICD-10-CM | POA: Diagnosis not present

## 2020-04-15 DIAGNOSIS — M25612 Stiffness of left shoulder, not elsewhere classified: Secondary | ICD-10-CM

## 2020-04-15 NOTE — Patient Instructions (Signed)
Access Code: [email or web address] URL: [email or web address] Date: 04/15/2020 Prepared by: Daleen Bo  Exercises Standing Shoulder Flexion Wall Walk - 1 x daily - 7 x weekly - 2 sets - 10 reps - 5 hold Standing Shoulder Row with Anchored Resistance - 1 x daily - 7 x weekly - 3 sets - 10 reps Shoulder Internal Rotation with Resistance - 1 x daily - 7 x weekly - 2 sets - 10 reps Shoulder External Rotation and Scapular Retraction with Resistance - 1 x daily - 7 x weekly - 2 sets - 10 reps Standing Shoulder Posterior Capsule Stretch - 1 x daily - 7 x weekly - 3 sets - 10 reps Seated Shoulder External Rotation PROM on Table - 2 x daily - 7 x weekly - 1 sets - 10 reps - 10 hold Standing Shoulder Extension with Dowel - 2 x daily - 7 x weekly - 1 sets - 10 reps - 3 hold Standing Shoulder Internal Rotation Stretch with Towel - 2 x daily - 7 x weekly - 1 sets - 10 reps - 10 hold Single Arm Doorway Pec Stretch at 90 Degrees Abduction - 1 x daily - 7 x weekly - 1 sets - 3 reps - 30 hold Sleeper Stretch - 1 x daily - 7 x weekly - 1 sets - 3 reps - 30 hold

## 2020-04-15 NOTE — Therapy (Addendum)
[facility] [address] [place], [place], [address] Phone: [phone]   Fax:  [phone]  Physical Therapy Treatment  Patient Details  Name: Stephen [name] MRN: [id number] Date of Birth: 1970-11-11 Referring Provider (PT): Dr. Georgina Snell   Encounter Date: 04/15/2020   PT End of Session - 04/15/20 0811    Visit Number 8    Number of Visits 17    Date for PT Re-Evaluation 04/23/20    Authorization Type BCBS    PT Start Time 0802    PT Stop Time 0845    PT Time Calculation (min) 43 min    Activity Tolerance Patient tolerated treatment well    Behavior During Therapy [facility] for tasks assessed/performed           Past Medical History:  Diagnosis Date  . Basal cell carcinoma    multiple skin cancers removed  . Degenerative disc disease, lumbar   . Ruptured cervical disc   . Sleep apnea    no cpap    Past Surgical History:  Procedure Laterality Date  . APPENDECTOMY    . EYE MUSCLE SURGERY     2010/2011--lazy  . WISDOM TOOTH EXTRACTION      There were no vitals filed for this visit.   Subjective Assessment - 04/15/20 0809    Subjective Pt states he felt that the front of his arm felt a little sore yesterday but that me possible due to STM from previous session. He feels he is able to reach behind back above beltline at this point.    Currently in Pain? No/denies    Multiple Pain Sites No                             [facility] Adult PT Treatment/Exercise - 04/15/20 0001      Exercises   Exercises Shoulder                       Shoulder Exercises: Sidelying   External Rotation Weight (lbs) 2    External Rotation Limitations towel under arm      Shoulder Exercises: Standing   Other Standing Exercises standing IR/ER with RTB 15x each; standing arm circles fwd and back 10x          Prone shoulder extension 2x10 cued for palm forward and full extension         Shoulder Exercises: ROM/Strengthening    UBE (Upper Arm Bike) 3 min fwd and 3 min back      Shoulder Exercises: Stretch   Corner Stretch 2 reps;30 seconds    Corner Stretch Limitations arm at 60    Internal Rotation Stretch 10 seconds    Internal Rotation Stretch Limitations 10x 3x      Manual Therapy   Manual Therapy Joint mobilization;Soft tissue mobilization    Joint Mobilization grade III-IV LAD with ant (prone) inf and post GH glide    Soft tissue mobilization L ant detloid and biceps, infra                  PT Education - 04/15/20 0810    Education Details HEP, eccentric relaxation emphasis, anatomy, POC    Person(s) Educated Patient    Methods Explanation;Demonstration;Tactile cues;Verbal cues    Comprehension Verbalized understanding;Returned demonstration            PT Short Term Goals - 03/24/20 1206      PT  SHORT TERM GOAL #1   Title Pt will become independent with HEP in order to demonstrate synthesis of PT education.    Time 2    Period Weeks    Status New      PT SHORT TERM GOAL #2   Title Pt will be able to demonstrate BHB reach to C7 and L5 in order to demonstrate functional improvement in L UE function for self-care and house hold duties.    Time 4    Period Weeks    Status New             PT Long Term Goals - 03/24/20 1207      PT LONG TERM GOAL #1   Title Pt will be able to reach [facility] and grab/reach/hold >10 lbs in order to demonstrate functional improvement in L UE function for return to full ADL    Time 8    Period Weeks    Status New      PT LONG TERM GOAL #3   Title Pt will have a >15 pt decrease in Quick DASH score in order to demonstrate clinically significant improvement in L UE function.    Time 8    Period Weeks                 Plan - 04/15/20 1601    Clinical Impression Statement Pt had increased infra as well as ant deltoid and biceps tone that was relieved with STM and joint moblization. Pt has improvement with IR reaching, especially behind back. Pt is  able to tolerate increased 90/90 position stretching without increased pain. Pt has become more independent with rehab and is able to decrease frequency of visits at this time. Plan to progress stretching towards more open packed and strengthening/eccentric control as tolerated to promote L shoulder mobility. Pt would benefit from continued skilled therapy in order to reach goals and maximize functional LUE strength and ROM for full return to PLOF.    Personal Factors and Comorbidities Profession    Examination-Activity Limitations Transfers;Bathing;Dressing;Sleep;Hygiene/Grooming;Lift;Carry;Reach Overhead    Examination-Participation Restrictions Occupation;Cleaning;Laundry;Yard Work;Community Activity    Stability/Clinical Decision Making Stable/Uncomplicated    Rehab Potential Good    PT Frequency 2x / week    PT Duration 8 weeks    PT Treatment/Interventions ADLs/Self Care Home Management;Biofeedback;Electrical Stimulation;Iontophoresis 4mg /ml Dexamethasone;Moist Heat;Traction;Ultrasound;Functional mobility training;Therapeutic activities;Therapeutic exercise;Neuromuscular re-education;Patient/family education;Manual techniques;Passive range of motion;Dry needling;Energy conservation;Taping;Joint Manipulations;Spinal Manipulations    PT Next Visit Plan Review HEP, prone ABD, prone extension, UBE    PT Home Exercise Plan [email or web address]    Consulted and Agree with Plan of Care Patient           Patient will benefit from skilled therapeutic intervention in order to improve the following deficits and impairments:  Hypomobility,Increased muscle spasms,Pain,Postural dysfunction,Impaired UE functional use,Impaired flexibility,Decreased strength,Decreased range of motion,Decreased activity tolerance  Visit Diagnosis: Pain in joint of left shoulder  Limitation of joint motion of left shoulder  Muscle weakness (generalized)     Problem List Patient Active Problem List   Diagnosis Date Noted  .  Adverse reaction to NSAIDs 12/16/2019  . Elevated PSA 10/21/2019  . Tinnitus of both ears 09/15/2019  . Benign prostatic hyperplasia 09/15/2019  . Obesity 09/15/2019  . Recurrent low back pain 09/13/2018  . Stasis dermatitis 09/13/2018  . Prediabetes 02/03/2017  . Bilateral lower extremity edema 02/02/2017  . OSA (obstructive sleep apnea) 02/02/2017   Daleen Bo PT, DPT 04/15/20 8:50 AM   [facility] [place]  [facility] [address] [place], [place], [address] Phone: [phone]   Fax:  [phone]  Name: Cayleb Jarnigan MRN: [id number] Date of Birth: [birth year]

## 2020-04-20 ENCOUNTER — Encounter: Payer: BC Managed Care – PPO | Admitting: Physical Therapy

## 2020-04-23 ENCOUNTER — Ambulatory Visit (INDEPENDENT_AMBULATORY_CARE_PROVIDER_SITE_OTHER): Payer: BC Managed Care – PPO | Admitting: Physical Therapy

## 2020-04-23 ENCOUNTER — Other Ambulatory Visit: Payer: Self-pay

## 2020-04-23 ENCOUNTER — Encounter: Payer: Self-pay | Admitting: Physical Therapy

## 2020-04-23 DIAGNOSIS — M25612 Stiffness of left shoulder, not elsewhere classified: Secondary | ICD-10-CM

## 2020-04-23 DIAGNOSIS — M6281 Muscle weakness (generalized): Secondary | ICD-10-CM

## 2020-04-23 DIAGNOSIS — M25512 Pain in left shoulder: Secondary | ICD-10-CM

## 2020-04-23 NOTE — Therapy (Signed)
[facility] [address] [place], [place], [address] Phone: [phone]   Fax:  [phone]  Physical Therapy Treatment/Progress Note  Patient Details  Name: Stephen [name] MRN: [id number] Date of Birth: 1970/08/17 Referring Provider (PT): Dr. Georgina Snell   Encounter Date: 04/23/2020   PT End of Session - 04/23/20 0808    Visit Number 9    Number of Visits 17    Date for PT Re-Evaluation 05/23/20    Authorization Type BCBS    PT Start Time 0803    PT Stop Time 0845    PT Time Calculation (min) 42 min    Activity Tolerance Patient tolerated treatment well    Behavior During Therapy [facility] for tasks assessed/performed           Past Medical History:  Diagnosis Date  . Basal cell carcinoma    multiple skin cancers removed  . Degenerative disc disease, lumbar   . Ruptured cervical disc   . Sleep apnea    no cpap    Past Surgical History:  Procedure Laterality Date  . APPENDECTOMY    . EYE MUSCLE SURGERY     2010/2011--lazy  . WISDOM TOOTH EXTRACTION      There were no vitals filed for this visit.   Subjective Assessment - 04/23/20 0811    Subjective Pt states the shoulder is slowly making progress. He still feels that the ER wall stretch as well as reaching behind the back are still difficult when getting dressed. He has had no pain since last session and just feels like the shoulder is stiff at end range. Pt states he is ~50% better.    Currently in Pain? No/denies              [facility] PT Assessment - 04/23/20 0001      Assessment   Medical Diagnosis L shoulder pain    Referring Provider (PT) Dr. Georgina Snell    Prior Therapy N/A      Precautions   Precautions None      Restrictions   Weight Bearing Restrictions No      Balance Screen   Has the patient fallen in the past 6 months No      [place] residence    Living Arrangements Spouse/significant other;Children      Prior Function    Level of Independence Independent      Cognition   Overall Cognitive Status Within Functional Limits for tasks assessed      Observation/Other Assessments   Other Surveys  Quick Dash    Quick DASH  2.3%      Posture/Postural Control   Posture/Postural Control Postural limitations    Postural Limitations Increased thoracic kyphosis;Rounded Shoulders      AROM   Overall AROM Comments L Flexion 153 148 abd  ER C6 IR L1      PROM   Overall PROM  Deficits    Overall PROM Comments L Flexion 161  ABD 155  ER 47  IR 60      Strength   Overall Strength Deficits    Overall Strength Comments L 4+/5 through shoulder and elbow      Palpation   Palpation comment Joint capsular restriction with inf and post glide; hypertonicity of deltoid                         [facility] Adult PT Treatment/Exercise - 04/23/20 0001  Exercises   Exercises Shoulder                                     Shoulder Exercises: ROM/Strengthening   UBE (Upper Arm Bike) 3 min fwd and 3 min back      Shoulder Exercises: Stretch   Corner Stretch 2 reps;30 seconds    Corner Stretch Limitations arm at 90 PNF contract relax    Internal Rotation Stretch 10 seconds    Internal Rotation Stretch Limitations 10x 3x, PNF  contract relax      Manual Therapy   Manual Therapy Joint mobilization;Soft tissue mobilization    Joint Mobilization Grade IV inf, poster, and anterior GHJ mobilization at end of available range    Soft tissue mobilization L ant detloid                  PT Education - 04/23/20 0813    Education Details HEP, exercise progression, thermotherapy anatomy, POC    Person(s) Educated Patient    Methods Explanation;Demonstration;Tactile cues    Comprehension Verbalized understanding;Returned demonstration            PT Short Term Goals - 04/23/20 0813      PT SHORT TERM GOAL #1   Title Pt will become independent with HEP in order to demonstrate synthesis of PT  education.    Time 2    Period Weeks    Status Achieved      PT SHORT TERM GOAL #2   Title Pt will be able to demonstrate BHB reach to C7 and L5 in order to demonstrate functional improvement in L UE function for self-care and house hold duties.    Time 4    Period Weeks    Status Partially Met             PT Long Term Goals - 04/23/20 0908      PT LONG TERM GOAL #1   Title Pt will be able to reach [facility] and grab/reach/hold >10 lbs in order to demonstrate functional improvement in L UE function for return to full ADL    Time 8    Period Weeks    Status On-going      PT LONG TERM GOAL #2   Title Pt will demonnstrate symmetrical UE A/PROM in order to demonstrate functional return to PLOF.    Time 10    Period Weeks      PT LONG TERM GOAL #3   Title Pt will have a >15 pt decrease in Quick DASH score in order to demonstrate clinically significant improvement in L UE function.    Time 8    Period Weeks    Status Achieved                 Plan - 04/23/20 0917    Clinical Impression Statement Pt demonstrates significant improvement in ROM as demonstrated by objective measurement as well as Quick DASH outcome measure. Pt  is more stiff dominant than pain dominant.  Pt has been without pain for several visits at this point and reports improvement in sleep due to decreased pain. Pt continues to demonstrate joint capsular restriction and is most limited with ER. Pt does respond well to more aggressive joint mobilization without increasing pain. Plan to progress mobilizations and stretching exericse as tolerated. Pt would benefit from continued skilled therapy in order to reach goals and maximize functional  LUE strength and ROM for full return to PLOF.    Personal Factors and Comorbidities Profession    Examination-Activity Limitations Transfers;Bathing;Dressing;Sleep;Hygiene/Grooming;Lift;Carry;Reach Overhead    Examination-Participation Restrictions Occupation;Cleaning;Laundry;Yard  Work;Community Activity    Stability/Clinical Decision Making Stable/Uncomplicated    Rehab Potential Good    PT Frequency 2x / week    PT Duration 8 weeks    PT Treatment/Interventions ADLs/Self Care Home Management;Biofeedback;Electrical Stimulation;Iontophoresis 18m/ml Dexamethasone;Moist Heat;Traction;Ultrasound;Functional mobility training;Therapeutic activities;Therapeutic exercise;Neuromuscular re-education;Patient/family education;Manual techniques;Passive range of motion;Dry needling;Energy conservation;Taping;Joint Manipulations;Spinal Manipulations    PT Home Exercise Plan [email or web address]    Consulted and Agree with Plan of Care Patient           Patient will benefit from skilled therapeutic intervention in order to improve the following deficits and impairments:  Hypomobility,Increased muscle spasms,Pain,Postural dysfunction,Impaired UE functional use,Impaired flexibility,Decreased strength,Decreased range of motion,Decreased activity tolerance  Visit Diagnosis: Pain in joint of left shoulder  Limitation of joint motion of left shoulder  Muscle weakness (generalized)     Problem List Patient Active Problem List   Diagnosis Date Noted  . Adverse reaction to NSAIDs 12/16/2019  . Elevated PSA 10/21/2019  . Tinnitus of both ears 09/15/2019  . Benign prostatic hyperplasia 09/15/2019  . Obesity 09/15/2019  . Recurrent low back pain 09/13/2018  . Stasis dermatitis 09/13/2018  . Prediabetes 02/03/2017  . Bilateral lower extremity edema 02/02/2017  . OSA (obstructive sleep apnea) 02/02/2017   ADaleen BoPT, DPT 04/23/20 9:22 AM   [place] [address]Phone: [phone]  Fax:  [phone] Name: Stephen LitseyMRN: [id number] of Birth: 2Oct 13, 1972

## 2020-04-27 DIAGNOSIS — C61 Malignant neoplasm of prostate: Secondary | ICD-10-CM | POA: Diagnosis not present

## 2020-04-27 NOTE — Progress Notes (Signed)
   I, Peterson Lombard, LAT, ATC acting as a scribe for Stephen Leader, MD.  Stephen [name] is a 50 y.o. male who presents to [place] at [facility] today for f/u L shoulder/arm pain. Pt was last seen by Dr. Georgina Snell on 03/18/20 and was given a [place] steroid injection and was referred to PT of which he's completed 9 visits. Today, pt reports some soreness this morning. Pt notes PT has been helping and he is able to sleep on L shoulder. Pt reports pain is improving, but is still limited in AROM, esp IR and horz ABD.  Dx imaging: 03/18/20 L shoulder XR  11/16/19 C-spine MRI  11/13/19 C-spine XR  Pertinent review of systems: No fevers or chills  Relevant historical information: Elevated PSA, prediabetes, sleep apnea   Exam:  BP 130/86 (BP Location: Right Arm, Patient Position: Sitting, Cuff Size: Normal)   Pulse 76   Ht 6\' 1"  (1.854 m)   Wt 285 lb 3.2 oz (129.4 kg)   SpO2 97%   BMI 37.63 kg/m  General: Well Developed, well nourished, and in no acute distress.   MSK: Left shoulder normal-appearing nontender. Range of motion abduction 130 degrees. Internal rotation to lumbar spine. External rotation 20 degrees beyond neutral position Strength intact. Negative impingement testing.     Assessment and Plan: 50 y.o. male with left shoulder pain thought to be due to adhesive capsulitis.  Patient had glenohumeral intra-articular injection last visit and has done quite a bit of physical therapy and is improving.  Plan to continue home exercise program and PT.  Recheck in 6 weeks.  If worsening or not improving would consider MRI to confirm diagnosis.  Consider also repeat steroid injection.    Discussed warning signs or symptoms. Please see discharge instructions. Patient expresses understanding.   The above documentation has been reviewed and is accurate and complete Stephen [name], M.D.

## 2020-04-28 ENCOUNTER — Ambulatory Visit (INDEPENDENT_AMBULATORY_CARE_PROVIDER_SITE_OTHER): Payer: BC Managed Care – PPO | Admitting: Family Medicine

## 2020-04-28 ENCOUNTER — Other Ambulatory Visit: Payer: Self-pay

## 2020-04-28 VITALS — BP 130/86 | HR 76 | Ht 73.0 in | Wt 285.2 lb

## 2020-04-28 DIAGNOSIS — M7502 Adhesive capsulitis of left shoulder: Secondary | ICD-10-CM

## 2020-04-28 NOTE — Patient Instructions (Signed)
Thank you for coming in today.  Continue to work on home exercises and PT.   Recheck in about 6 weeks unless better.   We can do more eval if needed.

## 2020-04-29 ENCOUNTER — Telehealth: Payer: Self-pay | Admitting: Pulmonary Disease

## 2020-04-29 ENCOUNTER — Encounter: Payer: Self-pay | Admitting: Physical Therapy

## 2020-04-29 ENCOUNTER — Other Ambulatory Visit: Payer: Self-pay | Admitting: Urology

## 2020-04-29 ENCOUNTER — Other Ambulatory Visit: Payer: Self-pay

## 2020-04-29 ENCOUNTER — Ambulatory Visit (INDEPENDENT_AMBULATORY_CARE_PROVIDER_SITE_OTHER): Payer: BC Managed Care – PPO | Admitting: Physical Therapy

## 2020-04-29 DIAGNOSIS — G4733 Obstructive sleep apnea (adult) (pediatric): Secondary | ICD-10-CM

## 2020-04-29 DIAGNOSIS — M6281 Muscle weakness (generalized): Secondary | ICD-10-CM

## 2020-04-29 DIAGNOSIS — M25512 Pain in left shoulder: Secondary | ICD-10-CM

## 2020-04-29 DIAGNOSIS — M25612 Stiffness of left shoulder, not elsewhere classified: Secondary | ICD-10-CM | POA: Diagnosis not present

## 2020-04-29 NOTE — Therapy (Signed)
[facility] [address] [place], [place], [address] Phone: [phone]   Fax:  [phone]  Physical Therapy Treatment  Patient Details  Name: Stephen [name] MRN: [id number] Date of Birth: 07/13/1970 Referring Provider (PT): Dr. Corey   Encounter Date: 04/29/2020   PT End of Session - 04/29/20 0808    Visit Number 10    Number of Visits 17    Date for PT Re-Evaluation 05/23/20    Authorization Type BCBS    PT Start Time 0801    PT Stop Time 0840    PT Time Calculation (min) 39 min    Activity Tolerance Patient tolerated treatment well    Behavior During Therapy WFL for tasks assessed/performed           Past Medical History:  Diagnosis Date  . Basal cell carcinoma    multiple skin cancers removed  . Degenerative disc disease, lumbar   . Ruptured cervical disc   . Sleep apnea    no cpap    Past Surgical History:  Procedure Laterality Date  . APPENDECTOMY    . EYE MUSCLE SURGERY     2010/2011--lazy  . WISDOM TOOTH EXTRACTION      There were no vitals filed for this visit.   Subjective Assessment - 04/29/20 0806    Subjective Pt states that the L shoulder feels good today. He was a little sore during the week from sleeping on it. Otherwise, it has gotten better during the week. He was not sore after the more aggressive mobilization after last session.    Currently in Pain? No/denies    Multiple Pain Sites No                             [facility] Adult PT Treatment/Exercise - 04/29/20 0001      Posture/Postural Control   Posture/Postural Control Postural limitations    Postural Limitations Increased thoracic kyphosis;Rounded Shoulders      Exercises   Exercises Shoulder      Shoulder Exercises: Supine   Other Supine Exercises supine OH flexion hold 2lb 1 min hold      Shoulder Exercises: ROM/Strengthening   UBE (Upper Arm Bike) 3 min fwd and 3 min back      Shoulder Exercises: Stretch    Corner Stretch 2 reps;30 seconds    Corner Stretch Limitations --    Internal Rotation Stretch 10 seconds    Internal Rotation Stretch Limitations 10x 3x, PNF  contract relax    Other Shoulder Stretches shoulder wall slide ABD 10s 10x      Manual Therapy   Manual Therapy Joint mobilization;Soft tissue mobilization    Joint Mobilization Grade IV inf, poster, and anterior GHJ mobilization at end of available range, mob w movement flexion grade III    Soft tissue mobilization --                  PT Education - 04/29/20 1004    Education Details HEP, exercise progression, anatomy, POC    Person(s) Educated Patient    Methods Explanation;Demonstration;Tactile cues;Verbal cues    Comprehension Verbalized understanding;Returned demonstration            PT Short Term Goals - 04/23/20 0813      PT SHORT TERM GOAL #1   Title Pt will become independent with HEP in order to demonstrate synthesis of PT education.    Time 2      Period Weeks    Status Achieved      PT SHORT TERM GOAL #2   Title Pt will be able to demonstrate BHB reach to C7 and L5 in order to demonstrate functional improvement in L UE function for self-care and house hold duties.    Time 4    Period Weeks    Status Partially Met             PT Long Term Goals - 04/23/20 0908      PT LONG TERM GOAL #1   Title Pt will be able to reach [facility] and grab/reach/hold >10 lbs in order to demonstrate functional improvement in L UE function for return to full ADL    Time 8    Period Weeks    Status On-going      PT LONG TERM GOAL #2   Title Pt will demonnstrate symmetrical UE A/PROM in order to demonstrate functional return to PLOF.    Time 10    Period Weeks      PT LONG TERM GOAL #3   Title Pt will have a >15 pt decrease in Quick DASH score in order to demonstrate clinically significant improvement in L UE function.    Time 8    Period Weeks    Status Achieved                 Plan - 04/29/20 1005     Clinical Impression Statement Pt demonstrated improved A/PROM following joint mobilization at today's session. Pt was able to tolerate increased joint mobilization intensity at end range as well are participate with mobilization with movement. Pt was also able to tolerate 90/90 position and OH stretching with improved end range motion, though improvement is minor. Plan to progress ROM as tolerated for full return to PLOF and improved function.    Personal Factors and Comorbidities Profession    Examination-Activity Limitations Transfers;Bathing;Dressing;Sleep;Hygiene/Grooming;Lift;Carry;Reach Overhead    Examination-Participation Restrictions Occupation;Cleaning;Laundry;Yard Work;Community Activity    Stability/Clinical Decision Making Stable/Uncomplicated    Rehab Potential Good    PT Frequency 2x / week    PT Duration 8 weeks    PT Treatment/Interventions ADLs/Self Care Home Management;Biofeedback;Electrical Stimulation;Iontophoresis 38m/ml Dexamethasone;Moist Heat;Traction;Ultrasound;Functional mobility training;Therapeutic activities;Therapeutic exercise;Neuromuscular re-education;Patient/family education;Manual techniques;Passive range of motion;Dry needling;Energy conservation;Taping;Joint Manipulations;Spinal Manipulations    PT Home Exercise Plan [email or web address]    Consulted and Agree with Plan of Care Patient           Patient will benefit from skilled therapeutic intervention in order to improve the following deficits and impairments:  Hypomobility,Increased muscle spasms,Pain,Postural dysfunction,Impaired UE functional use,Impaired flexibility,Decreased strength,Decreased range of motion,Decreased activity tolerance  Visit Diagnosis: Pain in joint of left shoulder  Limitation of joint motion of left shoulder  Muscle weakness (generalized)     Problem List Patient Active Problem List   Diagnosis Date Noted  . Adhesive capsulitis of left shoulder 04/28/2020  . Adverse reaction to  NSAIDs 12/16/2019  . Elevated PSA 10/21/2019  . Tinnitus of both ears 09/15/2019  . Benign prostatic hyperplasia 09/15/2019  . Obesity 09/15/2019  . Recurrent low back pain 09/13/2018  . Stasis dermatitis 09/13/2018  . Prediabetes 02/03/2017  . Bilateral lower extremity edema 02/02/2017  . OSA (obstructive sleep apnea) 02/02/2017   ADaleen BoPT, DPT 04/29/20 10:32 AM   [place] [address]Phone: [phone]  Fax:  [phone] Name: Stephen McgillisMRN: [id number] of Birth: [birth year]

## 2020-04-29 NOTE — Telephone Encounter (Signed)
Kindly schedule patient for home sleep study  Insurance denying in lab study

## 2020-04-29 NOTE — Telephone Encounter (Signed)
Home sleep study has been ordered and Split night has been canceled. Called and made patient aware of changes. He expressed understanding. Nothing further needed at this time.

## 2020-05-04 DIAGNOSIS — M6281 Muscle weakness (generalized): Secondary | ICD-10-CM | POA: Diagnosis not present

## 2020-05-04 DIAGNOSIS — M62838 Other muscle spasm: Secondary | ICD-10-CM | POA: Diagnosis not present

## 2020-05-04 DIAGNOSIS — M6289 Other specified disorders of muscle: Secondary | ICD-10-CM | POA: Diagnosis not present

## 2020-05-04 DIAGNOSIS — C61 Malignant neoplasm of prostate: Secondary | ICD-10-CM | POA: Diagnosis not present

## 2020-05-06 ENCOUNTER — Encounter: Payer: Self-pay | Admitting: Physical Therapy

## 2020-05-06 ENCOUNTER — Other Ambulatory Visit: Payer: Self-pay

## 2020-05-06 ENCOUNTER — Ambulatory Visit (INDEPENDENT_AMBULATORY_CARE_PROVIDER_SITE_OTHER): Payer: BC Managed Care – PPO | Admitting: Physical Therapy

## 2020-05-06 DIAGNOSIS — M25612 Stiffness of left shoulder, not elsewhere classified: Secondary | ICD-10-CM | POA: Diagnosis not present

## 2020-05-06 DIAGNOSIS — M25512 Pain in left shoulder: Secondary | ICD-10-CM | POA: Diagnosis not present

## 2020-05-06 DIAGNOSIS — M6281 Muscle weakness (generalized): Secondary | ICD-10-CM

## 2020-05-06 NOTE — Therapy (Signed)
[facility] [address] [place], [place], [address] Phone: [phone]   Fax:  [phone]  Physical Therapy Treatment  Patient Details  Name: Stephen [name] MRN: [id number] Date of Birth: 09-23-1970 Referring Provider (PT): Dr. Georgina Snell   Encounter Date: 05/06/2020   PT End of Session - 05/06/20 0807    Visit Number 11    Number of Visits 17    Date for PT Re-Evaluation 05/23/20    Authorization Type BCBS    PT Start Time 0802    PT Stop Time 0845    PT Time Calculation (min) 43 min    Activity Tolerance Patient tolerated treatment well    Behavior During Therapy [facility] for tasks assessed/performed           Past Medical History:  Diagnosis Date  . Basal cell carcinoma    multiple skin cancers removed  . Degenerative disc disease, lumbar   . Ruptured cervical disc   . Sleep apnea    no cpap    Past Surgical History:  Procedure Laterality Date  . APPENDECTOMY    . EYE MUSCLE SURGERY     2010/2011--lazy  . WISDOM TOOTH EXTRACTION      There were no vitals filed for this visit.   Subjective Assessment - 05/06/20 0805    Subjective Pt states the shoulder is about the same. It is a little more sore today. Pt reports he used heat on it for a few hours yesterday. Still no pain within the shoulder.    Currently in Pain? No/denies    Multiple Pain Sites No                             [facility] Adult PT Treatment/Exercise - 05/06/20 0001      Posture/Postural Control   Posture/Postural Control Postural limitations    Postural Limitations Increased thoracic kyphosis;Rounded Shoulders      Exercises   Exercises Shoulder               Shoulder Exercises: Standing   Other Standing Exercises wall ball 20x CW and CCW    Other Standing Exercises IR walk red band 10x with ER stretch; ER walk red band with IR stretch 10x      Shoulder Exercises: ROM/Strengthening   UBE (Upper Arm Bike) 3 min fwd and 3 min back       Shoulder Exercises: Stretch           Internal Rotation Stretch Limitations 10x 3x, PNF  contract relax          Manual Therapy   Manual Therapy Joint mobilization;Soft tissue mobilization    Joint Mobilization Grade IV inf, poster, and anterior GHJ mobilization at end of available range, mob w movement flexion grade III, PNF stretching IR/ER    Soft tissue mobilization ant delt, biceps, infra                  PT Education - 05/06/20 0854    Education Details HEP, exercise progression, anatomy, POC, review of progressive muscle relaxation, neurological component to adhesive capsulitis, self HEP modifications    Person(s) Educated Patient    Methods Explanation;Demonstration;Tactile cues;Verbal cues    Comprehension Verbalized understanding;Returned demonstration            PT Short Term Goals - 04/23/20 0813      PT SHORT TERM GOAL #1   Title Pt will become independent with  HEP in order to demonstrate synthesis of PT education.    Time 2    Period Weeks    Status Achieved      PT SHORT TERM GOAL #2   Title Pt will be able to demonstrate BHB reach to C7 and L5 in order to demonstrate functional improvement in L UE function for self-care and house hold duties.    Time 4    Period Weeks    Status Partially Met             PT Long Term Goals - 04/23/20 0908      PT LONG TERM GOAL #1   Title Pt will be able to reach [facility] and grab/reach/hold >10 lbs in order to demonstrate functional improvement in L UE function for return to full ADL    Time 8    Period Weeks    Status On-going      PT LONG TERM GOAL #2   Title Pt will demonnstrate symmetrical UE A/PROM in order to demonstrate functional return to PLOF.    Time 10    Period Weeks      PT LONG TERM GOAL #3   Title Pt will have a >15 pt decrease in Quick DASH score in order to demonstrate clinically significant improvement in L UE function.    Time 8    Period Weeks    Status Achieved                  Plan - 05/06/20 7282    Clinical Impression Statement Pt made improvement with A/PROM following STM and joint mobs at today's session. Pt had most improvement in flexion, ABD, and ER reaching while IR behind back remains limited. Pt responded well to PNF contract relax stretching, especially with IR/ER in scaption. Pt also gave verbal understanding to edu re progressive muscle relaxation and neurological/emotional contributions to physical presentation. Pt would benefit from continued skilled therapy in order to reach goals and maximize functional L UE strength and ROM for full return to PLOF.    Personal Factors and Comorbidities Profession    Examination-Activity Limitations Transfers;Bathing;Dressing;Sleep;Hygiene/Grooming;Lift;Carry;Reach Overhead    Examination-Participation Restrictions Occupation;Cleaning;Laundry;Yard Work;Community Activity    Stability/Clinical Decision Making Stable/Uncomplicated    Rehab Potential Good    PT Frequency 2x / week    PT Duration 8 weeks    PT Treatment/Interventions ADLs/Self Care Home Management;Biofeedback;Electrical Stimulation;Iontophoresis 4mg /ml Dexamethasone;Moist Heat;Traction;Ultrasound;Functional mobility training;Therapeutic activities;Therapeutic exercise;Neuromuscular re-education;Patient/family education;Manual techniques;Passive range of motion;Dry needling;Energy conservation;Taping;Joint Manipulations;Spinal Manipulations    PT Home Exercise Plan [email or web address]    Consulted and Agree with Plan of Care Patient           Patient will benefit from skilled therapeutic intervention in order to improve the following deficits and impairments:  Hypomobility,Increased muscle spasms,Pain,Postural dysfunction,Impaired UE functional use,Impaired flexibility,Decreased strength,Decreased range of motion,Decreased activity tolerance  Visit Diagnosis: Pain in joint of left shoulder  Limitation of joint motion of left shoulder  Muscle  weakness (generalized)     Problem List Patient Active Problem List   Diagnosis Date Noted  . Adhesive capsulitis of left shoulder 04/28/2020  . Adverse reaction to NSAIDs 12/16/2019  . Elevated PSA 10/21/2019  . Tinnitus of both ears 09/15/2019  . Benign prostatic hyperplasia 09/15/2019  . Obesity 09/15/2019  . Recurrent low back pain 09/13/2018  . Stasis dermatitis 09/13/2018  . Prediabetes 02/03/2017  . Bilateral lower extremity edema 02/02/2017  . OSA (obstructive sleep apnea) 02/02/2017    Daleen Bo PT,  DPT 05/06/20 9:01 AM   [place], [place], [address] Phone: [phone]   Fax:  [phone]  Name: Stephen [name] MRN: [id number] Date of Birth: 1970/04/02

## 2020-05-11 ENCOUNTER — Ambulatory Visit (INDEPENDENT_AMBULATORY_CARE_PROVIDER_SITE_OTHER): Payer: BC Managed Care – PPO | Admitting: Physical Therapy

## 2020-05-11 ENCOUNTER — Encounter: Payer: Self-pay | Admitting: Physical Therapy

## 2020-05-11 ENCOUNTER — Other Ambulatory Visit: Payer: Self-pay

## 2020-05-11 DIAGNOSIS — M6281 Muscle weakness (generalized): Secondary | ICD-10-CM

## 2020-05-11 DIAGNOSIS — M25512 Pain in left shoulder: Secondary | ICD-10-CM

## 2020-05-11 DIAGNOSIS — M25612 Stiffness of left shoulder, not elsewhere classified: Secondary | ICD-10-CM | POA: Diagnosis not present

## 2020-05-11 NOTE — Patient Instructions (Signed)
Access Code: [email or web address] URL: [email or web address] Date: 05/11/2020 Prepared by: Daleen Bo  Exercises Standing Shoulder Flexion Wall Walk - 1 x daily - 3-4 x weekly - 2 sets - 10 reps - 5 hold Standing Shoulder Row with Anchored Resistance - 1 x daily - 3-4 x weekly - 3 sets - 10 reps Shoulder Internal Rotation with Resistance - 1 x daily - 3-4 x weekly - 2 sets - 10 reps Seated Shoulder External Rotation in Abduction Supported with Resistance - 1 x daily - 3-4 x weekly - 2 sets - 10 reps Standing Shoulder Extension with Dowel - 2 x daily - 3-4 x weekly - 1 sets - 10 reps - 3 hold Standing Shoulder Internal Rotation Stretch with Towel - 2 x daily - 3-4 x weekly - 1 sets - 10 reps - 10 hold Single Arm Doorway Pec Stretch at 90 Degrees Abduction - 1 x daily - 3-4 x weekly - 1 sets - 3 reps - 30 hold Shoulder extension with resistance - Neutral - 1 x daily - 3-4 x weekly - 3 sets - 10 reps

## 2020-05-11 NOTE — Therapy (Addendum)
[facility] [address] [place], [place], [address] Phone: [phone]   Fax:  [phone]  Physical Therapy Treatment PHYSICAL THERAPY DISCHARGE SUMMARY  Visits from Start of Care: 12  Plan: Patient agrees to discharge.  Patient goals were not met. Patient is being discharged due to not returning to therapy.      Patient Details  Name: Stephen [name] MRN: [id number] Date of Birth: 09/30/1970 Referring Provider (PT): Dr. Georgina Snell   Encounter Date: 05/11/2020   PT End of Session - 05/11/20 0901     Visit Number 12    Number of Visits 17    Date for PT Re-Evaluation 05/23/20    Authorization Type [employer]    PT Start Time 0801    PT Stop Time 0846    PT Time Calculation (min) 45 min    Activity Tolerance Patient tolerated treatment well    Behavior During Therapy WFL for tasks assessed/performed             Past Medical History:  Diagnosis Date   Basal cell carcinoma    multiple skin cancers removed   Degenerative disc disease, lumbar    Ruptured cervical disc    Sleep apnea    no cpap    Past Surgical History:  Procedure Laterality Date   APPENDECTOMY     EYE MUSCLE SURGERY     2010/2011--lazy   WISDOM TOOTH EXTRACTION      There were no vitals filed for this visit.   Subjective Assessment - 05/11/20 0807     Subjective Pt reports no new complaints. Pt states he has similar motion as previous session.    Currently in Pain? No/denies    Multiple Pain Sites No                               [facility] Adult PT Treatment/Exercise - 05/11/20 0001       Posture/Postural Control   Posture/Postural Control Postural limitations    Postural Limitations Increased thoracic kyphosis;Rounded Shoulders      Exercises   Exercises Shoulder      Shoulder Exercises: Supine   Other Supine Exercises --      Shoulder Exercises: Seated   Other Seated Exercises 90/90 IR/ER 2lb 2x10      Shoulder  Exercises: Standing   Other Standing Exercises wall ball 20x CW and CCW; self massage tennis ball 2 min    Other Standing Exercises IR walk red band 10x with ER stretch; ER walk red band with IR stretch 10x; standing extension RTB 2x10      Shoulder Exercises: ROM/Strengthening   UBE (Upper Arm Bike) 3 min fwd and 3 min back      Shoulder Exercises: Stretch   Corner Stretch --    Internal Rotation Stretch 10 seconds    Internal Rotation Stretch Limitations 10x 3x, PNF  contract relax      Manual Therapy   Manual Therapy Joint mobilization;Soft tissue mobilization    Joint Mobilization Grade IV inf, poster at end of available range,  PNF stretching IR/ER    Soft tissue mobilization ant delt, biceps, infra                    PT Education - 05/11/20 0906     Education Details HEP, exercise progression, anatomy, self massage, utilization of massage therapy, eccentric stretching, PNF stretching    Person(s) Educated Patient  Methods Explanation;Demonstration;Tactile cues;Verbal cues    Comprehension Verbalized understanding;Returned demonstration              PT Short Term Goals - 04/23/20 0813       PT SHORT TERM GOAL #1   Title Pt will become independent with HEP in order to demonstrate synthesis of PT education.    Time 2    Period Weeks    Status Achieved      PT SHORT TERM GOAL #2   Title Pt will be able to demonstrate BHB reach to C7 and L5 in order to demonstrate functional improvement in L UE function for self-care and house hold duties.    Time 4    Period Weeks    Status Partially Met               PT Long Term Goals - 04/23/20 0908       PT LONG TERM GOAL #1   Title Pt will be able to reach [facility] and grab/reach/hold >10 lbs in order to demonstrate functional improvement in L UE function for return to full ADL    Time 8    Period Weeks    Status On-going      PT LONG TERM GOAL #2   Title Pt will demonnstrate symmetrical UE A/PROM in order  to demonstrate functional return to PLOF.    Time 10    Period Weeks      PT LONG TERM GOAL #3   Title Pt will have a >15 pt decrease in Quick DASH score in order to demonstrate clinically significant improvement in L UE function.    Time 8    Period Weeks    Status Achieved                   Plan - 05/11/20 6433     Clinical Impression Statement Pt presents with improved joint mobility and AROM at today's session. ABD as well as IR have both improved significantly. ER and IR behind back reach are still limited. Pt did have improved soft tissue extensibility following, localized twitch response in UT and infra. Pt was able to progress to 90/90 position eccentric exercise at today's session without increased pain or discomfort. Pt is progressing at a steady, although slow, rate. Pt to be seen for 1 more visit before other surgical procedures. Pt would benefit from continued skilled therapy in order to reach goals and maximize functional L UE strength and ROM for full return to PLOF.    Personal Factors and Comorbidities Profession    Examination-Activity Limitations Transfers;Bathing;Dressing;Sleep;Hygiene/Grooming;Lift;Carry;Reach Overhead    Examination-Participation Restrictions Occupation;Cleaning;Laundry;Yard Work;Community Activity    Stability/Clinical Decision Making Stable/Uncomplicated    Rehab Potential Good    PT Frequency 2x / week    PT Duration 8 weeks    PT Treatment/Interventions ADLs/Self Care Home Management;Biofeedback;Electrical Stimulation;Iontophoresis 26m/ml Dexamethasone;Moist Heat;Traction;Ultrasound;Functional mobility training;Therapeutic activities;Therapeutic exercise;Neuromuscular re-education;Patient/family education;Manual techniques;Passive range of motion;Dry needling;Energy conservation;Taping;Joint Manipulations;Spinal Manipulations    PT Home Exercise Plan [id number]    Consulted and Agree with Plan of Care Patient             Patient will  benefit from skilled therapeutic intervention in order to improve the following deficits and impairments:  Hypomobility,Increased muscle spasms,Pain,Postural dysfunction,Impaired UE functional use,Impaired flexibility,Decreased strength,Decreased range of motion,Decreased activity tolerance  Visit Diagnosis: Pain in joint of left shoulder  Limitation of joint motion of left shoulder  Muscle weakness (generalized)     Problem List  Patient Active Problem List   Diagnosis Date Noted   Adhesive capsulitis of left shoulder 04/28/2020   Adverse reaction to NSAIDs 12/16/2019   Elevated PSA 10/21/2019   Tinnitus of both ears 09/15/2019   Benign prostatic hyperplasia 09/15/2019   Obesity 09/15/2019   Recurrent low back pain 09/13/2018   Stasis dermatitis 09/13/2018   Prediabetes 02/03/2017   Bilateral lower extremity edema 02/02/2017   OSA (obstructive sleep apnea) 02/02/2017    Daleen Bo PT, DPT 05/11/20 9:07 AM   [place], [place], [address] Phone: [phone]   Fax:  [phone]  Name: Demerius Podolak MRN: [id number] Date of Birth: 17-Oct-1970

## 2020-05-17 NOTE — Progress Notes (Signed)
DUE TO COVID-19 ONLY ONE VISITOR IS ALLOWED TO COME WITH YOU AND STAY IN THE WAITING ROOM ONLY DURING PRE OP AND PROCEDURE DAY OF SURGERY. THE 1 VISITOR  MAY VISIT WITH YOU AFTER SURGERY IN YOUR PRIVATE ROOM DURING VISITING HOURS ONLY!  YOU NEED TO HAVE A COVID 19 TEST ON__5/09/2020 _____ @_______ , THIS TEST MUST BE DONE BEFORE SURGERY,  COVID TESTING SITE [address] [place] [id number], IT IS ON THE RIGHT GOING OUT [address] APPROXIMATELY  2 MINUTES PAST [employer] ON THE RIGHT. ONCE YOUR COVID TEST IS COMPLETED,  PLEASE BEGIN THE QUARANTINE INSTRUCTIONS AS OUTLINED IN YOUR HANDOUT.                Vicky Mccanless  05/17/2020   Your procedure is scheduled on:        05/27/2020   Report to [facility] Main  Entrance   Report to admitting at    [place] AM     Call this number if you have problems the morning of surgery [phone]    Remember: Do not eat food , candy gum or mints :After Midnight. You may have clear liquids from midnight until 0430am  Magnesium citra - one bottle at 12 noon day before surgeryl   Fleets enema nite before surgery.     CLEAR LIQUID DIET   Foods Allowed                                                                       Coffee and tea, regular and decaf                              Plain Jell-O any favor except red or purple                                            Fruit ices (not with fruit pulp)                                      Iced Popsicles                                     Carbonated beverages, regular and diet                                    Cranberry, grape and apple juices Sports drinks like Gatorade Lightly seasoned clear broth or consume(fat free) Sugar, honey syrup   _____________________________________________________________________    BRUSH YOUR TEETH MORNING OF SURGERY AND RINSE YOUR MOUTH OUT, NO CHEWING GUM CANDY OR MINTS.     Take these medicines the morning of surgery with A SIP OF  WATER:       F;pmax  DO NOT TAKE ANY DIABETIC MEDICATIONS DAY OF YOUR SURGERY  You may not have any metal on your body including hair pins and              piercings  Do not wear jewelry, make-up, lotions, powders or perfumes, deodorant             Do not wear nail polish on your fingernails.  Do not shave  48 hours prior to surgery.              Men may shave face and neck.   Do not bring valuables to the hospital. [place].  Contacts, dentures or bridgework may not be worn into surgery.  Leave suitcase in the car. After surgery it may be brought to your room.     Patients discharged the day of surgery will not be allowed to drive home. IF YOU ARE HAVING SURGERY AND GOING HOME THE SAME DAY, YOU MUST HAVE AN ADULT TO DRIVE YOU HOME AND BE WITH YOU FOR 24 HOURS. YOU MAY GO HOME BY TAXI OR UBER OR ORTHERWISE, BUT AN ADULT MUST ACCOMPANY YOU HOME AND STAY WITH YOU FOR 24 HOURS.  Name and phone number of your driver:  Special Instructions: N/A              Please read over the following fact sheets you were given: _____________________________________________________________________  [facility] - Preparing for Surgery Before surgery, you can play an important role.  Because skin is not sterile, your skin needs to be as free of germs as possible.  You can reduce the number of germs on your skin by washing with CHG (chlorahexidine gluconate) soap before surgery.  CHG is an antiseptic cleaner which kills germs and bonds with the skin to continue killing germs even after washing. Please DO NOT use if you have an allergy to CHG or antibacterial soaps.  If your skin becomes reddened/irritated stop using the CHG and inform your nurse when you arrive at Short Stay. Do not shave (including legs and underarms) for at least 48 hours prior to the first CHG shower.  You may shave your face/neck. Please follow these instructions  carefully:  1.  Shower with CHG Soap the night before surgery and the  morning of Surgery.  2.  If you choose to wash your hair, wash your hair first as usual with your  normal  shampoo.  3.  After you shampoo, rinse your hair and body thoroughly to remove the  shampoo.                           4.  Use CHG as you would any other liquid soap.  You can apply chg directly  to the skin and wash                       Gently with a scrungie or clean washcloth.  5.  Apply the CHG Soap to your body ONLY FROM THE NECK DOWN.   Do not use on face/ open                           Wound or open sores. Avoid contact with eyes, ears mouth and genitals (private parts).  Wash face,  Genitals (private parts) with your normal soap.             6.  Wash thoroughly, paying special attention to the area where your surgery  will be performed.  7.  Thoroughly rinse your body with warm water from the neck down.  8.  DO NOT shower/wash with your normal soap after using and rinsing off  the CHG Soap.                9.  Pat yourself dry with a clean towel.            10.  Wear clean pajamas.            11.  Place clean sheets on your bed the night of your first shower and do not  sleep with pets. Day of Surgery : Do not apply any lotions/deodorants the morning of surgery.  Please wear clean clothes to the hospital/surgery center.  FAILURE TO FOLLOW THESE INSTRUCTIONS MAY RESULT IN THE CANCELLATION OF YOUR SURGERY PATIENT SIGNATURE_________________________________  NURSE SIGNATURE__________________________________  ________________________________________________________________________

## 2020-05-18 ENCOUNTER — Encounter (HOSPITAL_COMMUNITY): Payer: Self-pay

## 2020-05-18 ENCOUNTER — Other Ambulatory Visit: Payer: Self-pay

## 2020-05-18 ENCOUNTER — Encounter (HOSPITAL_COMMUNITY)
Admission: RE | Admit: 2020-05-18 | Discharge: 2020-05-18 | Disposition: A | Discharge status: Home / Self Care | Payer: BC Managed Care – PPO | Source: Ambulatory Visit | Attending: Urology | Admitting: Urology

## 2020-05-18 DIAGNOSIS — Z01812 Encounter for preprocedural laboratory examination: Secondary | ICD-10-CM | POA: Diagnosis not present

## 2020-05-18 HISTORY — DX: Pain in left shoulder: M25.512

## 2020-05-18 LAB — BASIC METABOLIC PANEL
Anion gap: 6 (ref 5–15)
BUN: 14 mg/dL (ref 6–20)
CO2: 26 mmol/L (ref 22–32)
Calcium: 8.9 mg/dL (ref 8.9–10.3)
Chloride: 108 mmol/L (ref 98–111)
Creatinine, Ser: 1.13 mg/dL (ref 0.61–1.24)
GFR, Estimated: >60 mL/min (ref >60)
Glucose, Bld: 96 mg/dL (ref 70–99)
Potassium: 3.8 mmol/L (ref 3.5–5.1)
Sodium: 140 mmol/L (ref 135–145)

## 2020-05-18 LAB — CBC
HCT: 44.1 % (ref 39.0–52.0)
Hemoglobin: 14.7 g/dL (ref 13.0–17.0)
MCH: 31.7 pg (ref 26.0–34.0)
MCHC: 33.3 g/dL (ref 30.0–36.0)
MCV: 95.2 fL (ref 80.0–100.0)
Platelets: 166 10*3/uL (ref 150–400)
RBC: 4.63 MIL/uL (ref 4.22–5.81)
RDW: 12 % (ref 11.5–15.5)
WBC: 4 10*3/uL (ref 4.0–10.5)
nRBC: 0 % (ref 0.0–0.2)

## 2020-05-18 NOTE — Progress Notes (Addendum)
Anesthesia Review:  PCP: DR Parker- [place] 12/16/19- lov  Cardiologist : Followed by DR Revankar for 2 visits in 2019 and was released  Chest x-ray : EKG : Echo : 2019  Stress test: 2019  Cardiac Cath :  Activity level: can do a flight of stairs without difficulty  Sleep Study/ CPAP : none  Fasting Blood Sugar :      / Checks Blood Sugar -- times a day:   Blood Thinner/ Instructions /Last Dose: ASA / Instructions/ Last Dose :

## 2020-05-19 ENCOUNTER — Encounter: Payer: BC Managed Care – PPO | Admitting: Physical Therapy

## 2020-05-20 ENCOUNTER — Other Ambulatory Visit: Payer: Self-pay

## 2020-05-20 ENCOUNTER — Ambulatory Visit: Payer: BC Managed Care – PPO

## 2020-05-20 DIAGNOSIS — M6281 Muscle weakness (generalized): Secondary | ICD-10-CM | POA: Diagnosis not present

## 2020-05-20 DIAGNOSIS — G4733 Obstructive sleep apnea (adult) (pediatric): Secondary | ICD-10-CM

## 2020-05-20 DIAGNOSIS — N393 Stress incontinence (female) (male): Secondary | ICD-10-CM | POA: Diagnosis not present

## 2020-05-20 DIAGNOSIS — M62838 Other muscle spasm: Secondary | ICD-10-CM | POA: Diagnosis not present

## 2020-05-23 ENCOUNTER — Encounter (HOSPITAL_BASED_OUTPATIENT_CLINIC_OR_DEPARTMENT_OTHER): Payer: BC Managed Care – PPO | Admitting: Pulmonary Disease

## 2020-05-24 ENCOUNTER — Other Ambulatory Visit (HOSPITAL_COMMUNITY)
Admission: RE | Admit: 2020-05-24 | Discharge: 2020-05-24 | Disposition: A | Discharge status: Home / Self Care | Payer: BC Managed Care – PPO | Source: Ambulatory Visit | Attending: Urology | Admitting: Urology

## 2020-05-24 DIAGNOSIS — Z01812 Encounter for preprocedural laboratory examination: Secondary | ICD-10-CM | POA: Insufficient documentation

## 2020-05-24 DIAGNOSIS — Z20822 Contact with and (suspected) exposure to covid-19: Secondary | ICD-10-CM | POA: Insufficient documentation

## 2020-05-24 LAB — SARS CORONAVIRUS 2 (TAT 6-24 HRS): SARS Coronavirus 2: NEGATIVE

## 2020-05-25 ENCOUNTER — Telehealth: Payer: Self-pay | Admitting: Pulmonary Disease

## 2020-05-25 ENCOUNTER — Other Ambulatory Visit: Payer: Self-pay | Admitting: *Deleted

## 2020-05-25 DIAGNOSIS — G4733 Obstructive sleep apnea (adult) (pediatric): Secondary | ICD-10-CM | POA: Diagnosis not present

## 2020-05-25 NOTE — Telephone Encounter (Signed)
Call patient  Sleep study result  Date of study: 05/20/2020  Impression: Mild obstructive sleep apnea No significant oxygen desaturations  Recommendation: Options of treatment for mild obstructive sleep apnea will include  1.  CPAP therapy if there is significant daytime sleepiness or associated comorbidities including cardiac disease or cerebrovascular accidents -If CPAP is chosen as option of treatment, auto titrating CPAP with settings of 5-15 will be appropriate  2.  Watchful waiting with emphasis on weight loss measures, optimizing sleep position with emphasis on avoiding supine sleep, elevating the head of the bed by about 15 degrees may also help  3.  An oral device may be fashioned for the treatment of mild obstructive sleep apnea, this will involve referral to a dentist  Follow-up as previously scheduled May be scheduled for further discussion regarding options of treatment

## 2020-05-25 NOTE — Telephone Encounter (Signed)
See my chart message 05/25/20. Patient requested results. Results sent through my chart.

## 2020-05-26 ENCOUNTER — Encounter (HOSPITAL_BASED_OUTPATIENT_CLINIC_OR_DEPARTMENT_OTHER): Payer: BC Managed Care – PPO | Admitting: Pulmonary Disease

## 2020-05-26 NOTE — Anesthesia Preprocedure Evaluation (Addendum)
Anesthesia Evaluation  Patient identified by MRN, date of birth, ID band Patient awake    Reviewed: Allergy & Precautions, NPO status , Patient's Chart, lab work & pertinent test results  Airway Mallampati: II  TM Distance: <3 FB Neck ROM: Full    Dental no notable dental hx.    Pulmonary sleep apnea and Continuous Positive Airway Pressure Ventilation ,    Pulmonary exam normal breath sounds clear to auscultation       Cardiovascular negative cardio ROS Normal cardiovascular exam Rhythm:Regular Rate:Normal     Neuro/Psych negative neurological ROS  negative psych ROS   GI/Hepatic negative GI ROS, Neg liver ROS,   Endo/Other  obesity  Renal/GU negative Renal ROS  negative genitourinary   Musculoskeletal negative musculoskeletal ROS (+)   Abdominal   Peds negative pediatric ROS (+)  Hematology negative hematology ROS (+)   Anesthesia Other Findings   Reproductive/Obstetrics negative OB ROS                            Anesthesia Physical Anesthesia Plan  ASA: II  Anesthesia Plan: General   Post-op Pain Management:    Induction: Intravenous  PONV Risk Score and Plan: 2 and Ondansetron, Dexamethasone and Treatment may vary due to age or medical condition  Airway Management Planned: Oral ETT  Additional Equipment:   Intra-op Plan:   Post-operative Plan: Extubation in OR  Informed Consent: I have reviewed the patients History and Physical, chart, labs and discussed the procedure including the risks, benefits and alternatives for the proposed anesthesia with the patient or authorized representative who has indicated his/her understanding and acceptance.     Dental advisory given  Plan Discussed with: CRNA and Surgeon  Anesthesia Plan Comments:         Anesthesia Quick Evaluation

## 2020-05-26 NOTE — H&P (Signed)
Office Visit Report     04/27/2020   --------------------------------------------------------------------------------   Stephen [name]  MRN: [id number]  DOB: July 19, 1970, 50 year old Male  SSN:    PRIMARY CARE:    REFERRING:  Dimas Chyle, MD  PROVIDER:  Festus Aloe, M.D.  TREATING:  Raynelle Bring, M.D.  LOCATION:  [facility] [phone]     --------------------------------------------------------------------------------   CC/HPI: CC: Prostate Cancer   Physician requesting consult: Dr. Eda Keys  PCP: Dr. Dimas Chyle   Stephen [name] is a 50 year old male who was noted to have an elevated PSA of 11.8. This prompted a TRUS biopsy of the prostate on 03/15/20 by Dr. Junious Silk that revealed Gleason 4+3=7 adenocarcinoma of the prostate with 4 out of 12 biopsy cores positive for malignancy.   Family history: None.   Imaging studies:  CT pelvis (04/14/20): Negative for metastatic disease.   PMH: He no medical comorbidities. He has been evaluated for bilateral lower extremity edema and did see cardiology in 2019. Echocardiogram demonstrated mild aortic dilation without other concerning findings. He was seen by vascular surgery who felt that his lower extremity edema was likely venous insufficiency. He denies any chest pain or shortness of breath.  PSH: Laparoscopic appendectomy. He did have a postoperative infection requiring a healing of secondary intention of his laparoscopic incision sites.   TNM stage: cT1c N0 Mx  PSA: 11.7  Gleason score: 4+3=7 (GG 3)  Biopsy (03/15/20): 4/12 cores positive  Left: L lateral mid (90%, 4+3=7) L mid (40%, 4+3=7), L lateral base (70%, 4+3=7)  Right: R base (20%, 3+3=6)  Prostate volume: 29.0 cc   Nomogram  OC disease: 51%  EPE: 43%  SVI: 8%  LNI: 10%  PFS (5 year, 10 year): 61%, 45%   Urinary function: IPSS is 4.  Erectile function: SHIM score is 13. He states that he can get adequate erections but does have difficulty  maintaining his erections. He does have a prescription for sildenafil although has not tried this medication yet to assess his response.     ALLERGIES: No Allergies    MEDICATIONS: Sildenafil Citrate 20 mg tablet 1 tablet PO prn 1-5prn prior to sexual     GU PSH: Locm 300-399Mg /Ml Iodine,1Ml - 04/14/2020 Prostate Needle Biopsy - 03/15/2020     NON-GU PSH: Appendectomy (laparoscopic) - 2005 Eye Surgery (Unspecified), Bilateral- to correct lazy eye Surgical Pathology, Gross And Microscopic Examination For Prostate Needle - 03/15/2020     GU PMH: Prostate Cancer - 04/14/2020, I had a long discussion with the patient using the understanding prostate cancer booklet and his path report as a reference. We discussed his stage, grade and prognosis. We discussed the nature risks and benefits of active surveillance, radical prostatectomy, external beam radiotherapy, and brachytherapy. We discussed the role of androgen deprivation and chemotherapy in prostate cancer. We also discussed other ablative techniques such as HiFU and cryotherapy as well as whole gland versus focal treatment. We discussed specifically how each treatment might affect bowel, bladder and sexual function. We discussed how each treatment might effect salvage treatments and active surveillance might lead to progression and more difficult treatment in the future. All questions answered. He is interested in surgery. I will send to Dr. Alinda Money to discuss. I did order CT for staging. , - 03/29/2020 Elevated PSA, Nl DRE but not an easy exam due to body habitus. Prostate 29 g. - 03/15/2020, We discussed the nature, risks and benefits of PSA screening as well  as the nature of elevated PSA (benign versus malignant). We discussed the possibility of prostate cancer and that as the PSA rises above the level of 2.5 and even over 1, the risk of prostate cancer on biopsy increases. We discussed the management of prostate cancer might include active  surveillance or treatment depending on patient and cancer characteristics. In that context, we discussed the nature, risks and benefits of continued surveillance, other lab tests, transrectal ultrasound/prostate biopsy, or prostate MRI. All questions answered. PSA was sent and will set up bx if remains elevated. , - 02/04/2020 ED due to arterial insufficiency, disc nature r/b/a to pde5i and he will start sildenafil. Disc dosing and not to take with tams. - 02/04/2020    NON-GU PMH: Skin Cancer, History    FAMILY HISTORY: 2 daughters - Daughter Death In The Family Father - Father Death In The Family Mother - Mother Hypertension - Mother   SOCIAL HISTORY: Marital Status: Married Preferred Language: English; Ethnicity: Not Hispanic Or Latino; Race: White Current Smoking Status: Patient has never smoked.   Tobacco Use Assessment Completed: Used Tobacco in last 30 days? Does drink.  Drinks 1 caffeinated drink per day. Patient's occupation Chief Technology Officer.    VITAL SIGNS:      04/27/2020 09:28 AM  Weight 220 lb / 99.79 kg  Height 73 in / 185.42 cm  BP 120/79 mmHg  Pulse 75 /min  Temperature 97.5 F / 36.3 C  BMI 29.0 kg/m   GU PHYSICAL EXAMINATION:    Prostate: Prostate about 40 grams. Left lobe normal consistency, right lobe normal consistency. Symmetrical lobes. No prostate nodule. Left lobe no tenderness, right lobe no tenderness.    MULTI-SYSTEM PHYSICAL EXAMINATION:    Constitutional: Well-nourished. No physical deformities. Normally developed. Good grooming.  Neck: Neck symmetrical, not swollen. Normal tracheal position.  Respiratory: No labored breathing, no use of accessory muscles. Clear bilaterally.  Cardiovascular: Normal temperature, normal extremity pulses, no swelling, no varicosities. Regular rate and rhythm.  Lymphatic: No enlargement of neck, axillae, groin.  Skin: No paleness, no jaundice, no cyanosis. No lesion, no ulcer, no rash.  Neurologic / Psychiatric:  Oriented to time, oriented to place, oriented to person. No depression, no anxiety, no agitation.  Gastrointestinal: No mass, no tenderness, no rigidity, obese abdomen. Previous laparoscopic incisions noted.  Eyes: Normal conjunctivae. Normal eyelids.  Ears, Nose, Mouth, and Throat: Left ear no scars, no lesions, no masses. Right ear no scars, no lesions, no masses. Nose no scars, no lesions, no masses. Normal hearing. Normal lips.  Musculoskeletal: Normal gait and station of head and neck.     Complexity of Data:  Lab Test Review:   PSA  Records Review:   Pathology Reports, Previous Patient Records  X-Ray Review: C.T. Abdomen/Pelvis: Reviewed Films.     02/04/20  PSA  Total PSA 11.60 ng/mL    PROCEDURES:          Urinalysis Dipstick Dipstick Cont'd  Color: Yellow Bilirubin: Neg mg/dL  Appearance: Clear Ketones: Neg mg/dL  Specific Gravity: 1.025 Blood: Neg ery/uL  pH: 5.5 Protein: Neg mg/dL  Glucose: Neg mg/dL Urobilinogen: 0.2 mg/dL    Nitrites: Neg    Leukocyte Esterase: Neg leu/uL    ASSESSMENT:      ICD-10 Details  1 GU:   Prostate Cancer - C61    PLAN:           Schedule Return Visit/Planned Activity: Other See Visit Notes  Note: Will call to schedule surgery.  Return Visit/Planned Activity: Next Available Appointment - PT Referral             Note: Please schedule patient for preoperative PT prior to radical prostatectomy.            Document Letter(s):  Created for Patient: Clinical Summary         Notes:   1. Unfavorable intermediate risk prostate cancer: I had a detailed discussion with Stephen [name] and his wife today. I did recommend therapy of curative intent considering his disease parameters and long life expectancy. The patient was counseled about the natural history of prostate cancer and the standard treatment options that are available for prostate cancer. It was explained to him how his age and life expectancy, clinical stage, Gleason score,  and PSA affect his prognosis, the decision to proceed with additional staging studies, as well as how that information influences recommended treatment strategies. We discussed the roles for active surveillance, radiation therapy, surgical therapy, androgen deprivation, as well as ablative therapy options for the treatment of prostate cancer as appropriate to his individual cancer situation. We discussed the risks and benefits of these options with regard to their impact on cancer control and also in terms of potential adverse events, complications, and impact on quality of life particularly related to urinary and sexual function. The patient was encouraged to ask questions throughout the discussion today and all questions were answered to his stated satisfaction. In addition, the patient was provided with and/or directed to appropriate resources and literature for further education about prostate cancer and treatment options.   We discussed surgical therapy for prostate cancer including the different available surgical approaches. We discussed, in detail, the risks and expectations of surgery with regard to cancer control, urinary control, and erectile function as well as the expected postoperative recovery process. Additional risks of surgery including but not limited to bleeding, infection, hernia formation, nerve damage, lymphocele formation, bowel/rectal injury potentially necessitating colostomy, damage to the urinary tract resulting in urine leakage, urethral stricture, and the cardiopulmonary risks such as myocardial infarction, stroke, death, venothromboembolism, etc. were explained. The risk of open surgical conversion for robotic/laparoscopic prostatectomy was also discussed.   He does wish to proceed with surgical therapy and will be scheduled for a bilateral nerve-sparing robot assisted laparoscopic radical prostatectomy and bilateral pelvic lymphadenectomy.   Cc: Dr. Dimas Chyle  Dr. Festus Aloe              Next Appointment:      Next Appointment: 05/04/2020 08:00 AM    Appointment Type: 60 Physical Therapy    Location: [facility] [phone]    Provider: Mertha Finders, PT    Reason for Visit: inital pt eval for preoperative PT prior to radical      E & M CODES: We spent 63 minutes dedicated to evaluation and management time, including face to face interaction, discussions on coordination of care, documentation, result review, and discussion with others as applicable.     * Signed by Raynelle Bring, M.D. on 04/27/20 at 4:30 PM (ED*

## 2020-05-27 ENCOUNTER — Ambulatory Visit (HOSPITAL_COMMUNITY): Payer: BC Managed Care – PPO | Admitting: Physician Assistant

## 2020-05-27 ENCOUNTER — Encounter (HOSPITAL_COMMUNITY): Payer: Self-pay | Admitting: Urology

## 2020-05-27 ENCOUNTER — Encounter (HOSPITAL_COMMUNITY)
Admission: RE | Disposition: A | Discharge status: Home / Self Care | Payer: Self-pay | Source: Home / Self Care | Attending: Urology

## 2020-05-27 ENCOUNTER — Other Ambulatory Visit: Payer: Self-pay

## 2020-05-27 ENCOUNTER — Observation Stay (HOSPITAL_COMMUNITY)
Admission: RE | Admit: 2020-05-27 | Discharge: 2020-05-28 | Disposition: A | Discharge status: Home / Self Care | Payer: BC Managed Care – PPO | Attending: Urology | Admitting: Urology

## 2020-05-27 ENCOUNTER — Ambulatory Visit (HOSPITAL_COMMUNITY): Payer: BC Managed Care – PPO | Admitting: Certified Registered Nurse Anesthetist

## 2020-05-27 DIAGNOSIS — Z85828 Personal history of other malignant neoplasm of skin: Secondary | ICD-10-CM | POA: Diagnosis not present

## 2020-05-27 DIAGNOSIS — R7303 Prediabetes: Secondary | ICD-10-CM | POA: Diagnosis not present

## 2020-05-27 DIAGNOSIS — C61 Malignant neoplasm of prostate: Principal | ICD-10-CM | POA: Diagnosis present

## 2020-05-27 DIAGNOSIS — M7502 Adhesive capsulitis of left shoulder: Secondary | ICD-10-CM | POA: Diagnosis not present

## 2020-05-27 DIAGNOSIS — G4733 Obstructive sleep apnea (adult) (pediatric): Secondary | ICD-10-CM | POA: Diagnosis not present

## 2020-05-27 HISTORY — PX: ROBOT ASSISTED LAPAROSCOPIC RADICAL PROSTATECTOMY: SHX5141

## 2020-05-27 HISTORY — PX: LYMPHADENECTOMY: SHX5960

## 2020-05-27 LAB — HEMOGLOBIN AND HEMATOCRIT, BLOOD
HCT: 37.3 % — ABNORMAL LOW (ref 39.0–52.0)
Hemoglobin: 12.9 g/dL — ABNORMAL LOW (ref 13.0–17.0)

## 2020-05-27 LAB — TYPE AND SCREEN
ABO/RH(D): O POS
Antibody Screen: NEGATIVE

## 2020-05-27 LAB — ABO/RH: ABO/RH(D): O POS

## 2020-05-27 SURGERY — XI ROBOTIC ASSISTED LAPAROSCOPIC RADICAL PROSTATECTOMY LEVEL 2
Anesthesia: General

## 2020-05-27 MED ORDER — DOCUSATE SODIUM 100 MG PO CAPS
100.0000 mg | ORAL_CAPSULE | Freq: Two times a day (BID) | ORAL | Status: DC
  Administered 2020-05-27 – 2020-05-28 (×3): 100 mg via ORAL
  Filled 2020-05-27 (×3): qty 1

## 2020-05-27 MED ORDER — LABETALOL HCL 5 MG/ML IV SOLN
INTRAVENOUS | Status: AC
  Administered 2020-05-27: 5 mg via INTRAVENOUS
  Filled 2020-05-27: qty 4

## 2020-05-27 MED ORDER — CEFAZOLIN IN SODIUM CHLORIDE 3-0.9 GM/100ML-% IV SOLN
3.0000 g | INTRAVENOUS | Status: AC
  Administered 2020-05-27: 3 g via INTRAVENOUS
  Filled 2020-05-27: qty 100

## 2020-05-27 MED ORDER — ACETAMINOPHEN 10 MG/ML IV SOLN
1000.0000 mg | Freq: Four times a day (QID) | INTRAVENOUS | Status: AC
  Administered 2020-05-27 – 2020-05-28 (×4): 1000 mg via INTRAVENOUS
  Filled 2020-05-27 (×4): qty 100

## 2020-05-27 MED ORDER — SUGAMMADEX SODIUM 200 MG/2ML IV SOLN
INTRAVENOUS | Status: DC | PRN
  Administered 2020-05-27: 300 mg via INTRAVENOUS

## 2020-05-27 MED ORDER — PROPOFOL 500 MG/50ML IV EMUL
INTRAVENOUS | Status: AC
  Filled 2020-05-27: qty 50

## 2020-05-27 MED ORDER — ROCURONIUM BROMIDE 10 MG/ML (PF) SYRINGE
PREFILLED_SYRINGE | INTRAVENOUS | Status: AC
  Filled 2020-05-27: qty 10

## 2020-05-27 MED ORDER — ROCURONIUM BROMIDE 10 MG/ML (PF) SYRINGE
PREFILLED_SYRINGE | INTRAVENOUS | Status: DC | PRN
  Administered 2020-05-27: 10 mg via INTRAVENOUS
  Administered 2020-05-27: 80 mg via INTRAVENOUS
  Administered 2020-05-27 (×3): 20 mg via INTRAVENOUS

## 2020-05-27 MED ORDER — LIDOCAINE 2% (20 MG/ML) 5 ML SYRINGE
INTRAMUSCULAR | Status: AC
  Filled 2020-05-27: qty 5

## 2020-05-27 MED ORDER — CHLORHEXIDINE GLUCONATE CLOTH 2 % EX PADS
6.0000 | MEDICATED_PAD | Freq: Every day | CUTANEOUS | Status: DC
  Administered 2020-05-27 – 2020-05-28 (×2): 6 via TOPICAL

## 2020-05-27 MED ORDER — KETOROLAC TROMETHAMINE 15 MG/ML IJ SOLN
15.0000 mg | Freq: Four times a day (QID) | INTRAMUSCULAR | Status: DC
  Administered 2020-05-27 – 2020-05-28 (×3): 15 mg via INTRAVENOUS
  Filled 2020-05-27 (×3): qty 1

## 2020-05-27 MED ORDER — ONDANSETRON HCL 4 MG/2ML IJ SOLN: 4.0000 mg | INTRAMUSCULAR | Status: DC | PRN

## 2020-05-27 MED ORDER — BACITRACIN-NEOMYCIN-POLYMYXIN 400-5-5000 EX OINT: 1.0000 "application " | TOPICAL_OINTMENT | Freq: Three times a day (TID) | CUTANEOUS | Status: DC | PRN

## 2020-05-27 MED ORDER — ONDANSETRON HCL 4 MG/2ML IJ SOLN
INTRAMUSCULAR | Status: AC
  Filled 2020-05-27: qty 2

## 2020-05-27 MED ORDER — CYCLOBENZAPRINE HCL 10 MG PO TABS: 10.0000 mg | ORAL_TABLET | Freq: Three times a day (TID) | ORAL | Status: DC | PRN

## 2020-05-27 MED ORDER — ONDANSETRON HCL 4 MG/2ML IJ SOLN: 4.0000 mg | Freq: Once | INTRAMUSCULAR | Status: DC | PRN

## 2020-05-27 MED ORDER — FLEET ENEMA 7-19 GM/118ML RE ENEM: 1.0000 | ENEMA | Freq: Once | RECTAL | Status: DC

## 2020-05-27 MED ORDER — KCL IN DEXTROSE-NACL 20-5-0.45 MEQ/L-%-% IV SOLN
INTRAVENOUS | Status: DC
  Filled 2020-05-27 (×3): qty 1000

## 2020-05-27 MED ORDER — ZOLPIDEM TARTRATE 5 MG PO TABS: 5.0000 mg | ORAL_TABLET | Freq: Every evening | ORAL | Status: DC | PRN

## 2020-05-27 MED ORDER — DIPHENHYDRAMINE HCL 50 MG/ML IJ SOLN: 12.5000 mg | Freq: Four times a day (QID) | INTRAMUSCULAR | Status: DC | PRN

## 2020-05-27 MED ORDER — TRAMADOL HCL 50 MG PO TABS: 50.0000 mg | ORAL_TABLET | Freq: Four times a day (QID) | ORAL | 0 refills | Status: DC | PRN

## 2020-05-27 MED ORDER — ONDANSETRON HCL 4 MG/2ML IJ SOLN
INTRAMUSCULAR | Status: DC | PRN
  Administered 2020-05-27: 4 mg via INTRAVENOUS

## 2020-05-27 MED ORDER — DIPHENHYDRAMINE HCL 12.5 MG/5ML PO ELIX
12.5000 mg | ORAL_SOLUTION | Freq: Four times a day (QID) | ORAL | Status: DC | PRN
Start: 2020-05-27 — End: 2020-05-29

## 2020-05-27 MED ORDER — LIDOCAINE 2% (20 MG/ML) 5 ML SYRINGE
INTRAMUSCULAR | Status: DC | PRN
  Administered 2020-05-27: 100 mg via INTRAVENOUS

## 2020-05-27 MED ORDER — DOCUSATE SODIUM 100 MG PO CAPS: 100.0000 mg | ORAL_CAPSULE | Freq: Two times a day (BID) | ORAL | Status: DC

## 2020-05-27 MED ORDER — MORPHINE SULFATE (PF) 4 MG/ML IV SOLN
2.0000 mg | INTRAVENOUS | Status: DC | PRN
  Administered 2020-05-27: 4 mg via INTRAVENOUS
  Filled 2020-05-27: qty 1

## 2020-05-27 MED ORDER — DEXAMETHASONE SODIUM PHOSPHATE 4 MG/ML IJ SOLN
INTRAMUSCULAR | Status: DC | PRN
  Administered 2020-05-27: 10 mg via INTRAVENOUS

## 2020-05-27 MED ORDER — SODIUM CHLORIDE 0.9 % IV BOLUS
1000.0000 mL | Freq: Once | INTRAVENOUS | Status: AC
  Administered 2020-05-27: 1000 mL via INTRAVENOUS

## 2020-05-27 MED ORDER — BUPIVACAINE-EPINEPHRINE (PF) 0.25% -1:200000 IJ SOLN
INTRAMUSCULAR | Status: AC
  Filled 2020-05-27: qty 30

## 2020-05-27 MED ORDER — FENTANYL CITRATE (PF) 100 MCG/2ML IJ SOLN
INTRAMUSCULAR | Status: DC | PRN
  Administered 2020-05-27 (×2): 100 ug via INTRAVENOUS
  Administered 2020-05-27: 50 ug via INTRAVENOUS

## 2020-05-27 MED ORDER — BELLADONNA ALKALOIDS-OPIUM 16.2-60 MG RE SUPP: 1.0000 | Freq: Four times a day (QID) | RECTAL | Status: DC | PRN

## 2020-05-27 MED ORDER — ALBUMIN HUMAN 5 % IV SOLN: INTRAVENOUS | Status: DC | PRN

## 2020-05-27 MED ORDER — HYDROMORPHONE HCL 1 MG/ML IJ SOLN
INTRAMUSCULAR | Status: AC
  Filled 2020-05-27: qty 1

## 2020-05-27 MED ORDER — PHENYLEPHRINE 40 MCG/ML (10ML) SYRINGE FOR IV PUSH (FOR BLOOD PRESSURE SUPPORT)
PREFILLED_SYRINGE | INTRAVENOUS | Status: DC | PRN
  Administered 2020-05-27: 80 ug via INTRAVENOUS
  Administered 2020-05-27: 120 ug via INTRAVENOUS

## 2020-05-27 MED ORDER — MIDAZOLAM HCL 2 MG/2ML IJ SOLN
INTRAMUSCULAR | Status: AC
  Filled 2020-05-27: qty 2

## 2020-05-27 MED ORDER — SULFAMETHOXAZOLE-TRIMETHOPRIM 800-160 MG PO TABS: 1.0000 | ORAL_TABLET | Freq: Two times a day (BID) | ORAL | 0 refills | Status: DC

## 2020-05-27 MED ORDER — HYDRALAZINE HCL 10 MG PO TABS: 10.0000 mg | ORAL_TABLET | Freq: Three times a day (TID) | ORAL | Status: DC | PRN

## 2020-05-27 MED ORDER — MIDAZOLAM HCL 5 MG/5ML IJ SOLN
INTRAMUSCULAR | Status: DC | PRN
  Administered 2020-05-27: 2 mg via INTRAVENOUS

## 2020-05-27 MED ORDER — LABETALOL HCL 5 MG/ML IV SOLN: 5.0000 mg | Freq: Once | INTRAVENOUS | Status: AC

## 2020-05-27 MED ORDER — HYDROMORPHONE HCL 1 MG/ML IJ SOLN
0.2500 mg | INTRAMUSCULAR | Status: DC | PRN
  Administered 2020-05-27: 0.25 mg via INTRAVENOUS

## 2020-05-27 MED ORDER — FENTANYL CITRATE (PF) 250 MCG/5ML IJ SOLN
INTRAMUSCULAR | Status: AC
  Filled 2020-05-27: qty 5

## 2020-05-27 MED ORDER — PROPOFOL 10 MG/ML IV BOLUS
INTRAVENOUS | Status: DC | PRN
  Administered 2020-05-27: 200 mg via INTRAVENOUS

## 2020-05-27 MED ORDER — BUPIVACAINE-EPINEPHRINE 0.25% -1:200000 IJ SOLN
INTRAMUSCULAR | Status: DC | PRN
  Administered 2020-05-27: 30 mL

## 2020-05-27 MED ORDER — MAGNESIUM CITRATE PO SOLN: 1.0000 | Freq: Once | ORAL | Status: DC

## 2020-05-27 MED ORDER — CHLORHEXIDINE GLUCONATE 0.12 % MT SOLN
15.0000 mL | Freq: Once | OROMUCOSAL | Status: AC
  Administered 2020-05-27: 15 mL via OROMUCOSAL

## 2020-05-27 MED ORDER — HEPARIN SODIUM (PORCINE) 1000 UNIT/ML IJ SOLN
INTRAMUSCULAR | Status: AC
  Filled 2020-05-27: qty 1

## 2020-05-27 MED ORDER — DEXAMETHASONE SODIUM PHOSPHATE 10 MG/ML IJ SOLN
INTRAMUSCULAR | Status: AC
  Filled 2020-05-27: qty 1

## 2020-05-27 MED ORDER — LACTATED RINGERS IV SOLN: INTRAVENOUS | Status: DC

## 2020-05-27 MED ORDER — SODIUM CHLORIDE 0.9 % IR SOLN
Status: DC | PRN
  Administered 2020-05-27: 1000 mL

## 2020-05-27 MED ORDER — SODIUM CHLORIDE 0.9 % IV SOLN
1.0000 g | Freq: Three times a day (TID) | INTRAVENOUS | Status: AC
  Administered 2020-05-27 (×2): 1 g via INTRAVENOUS
  Filled 2020-05-27: qty 10
  Filled 2020-05-27: qty 1
  Filled 2020-05-27: qty 10

## 2020-05-27 MED ORDER — HYDROMORPHONE HCL 1 MG/ML IJ SOLN
INTRAMUSCULAR | Status: DC | PRN
  Administered 2020-05-27 (×2): .5 mg via INTRAVENOUS
  Administered 2020-05-27: 1 mg via INTRAVENOUS

## 2020-05-27 MED ORDER — HYDROMORPHONE HCL 2 MG/ML IJ SOLN
INTRAMUSCULAR | Status: AC
  Filled 2020-05-27: qty 1

## 2020-05-27 MED ORDER — LACTATED RINGERS IV SOLN: INTRAVENOUS | Status: DC | PRN

## 2020-05-27 SURGICAL SUPPLY — 61 items
APPLICATOR COTTON TIP 6 STRL (MISCELLANEOUS) ×2 IMPLANT
APPLICATOR COTTON TIP 6IN STRL (MISCELLANEOUS) ×3
CATH FOLEY 2WAY SLVR 18FR 30CC (CATHETERS) ×3 IMPLANT
CATH ROBINSON RED A/P 16FR (CATHETERS) ×3 IMPLANT
CATH ROBINSON RED A/P 8FR (CATHETERS) ×3 IMPLANT
CATH TIEMANN FOLEY 18FR 5CC (CATHETERS) ×3 IMPLANT
CHLORAPREP W/TINT 26 (MISCELLANEOUS) ×3 IMPLANT
CLIP VESOLOCK LG 6/CT PURPLE (CLIP) ×6 IMPLANT
COVER SURGICAL LIGHT HANDLE (MISCELLANEOUS) ×3 IMPLANT
COVER TIP SHEARS 8 DVNC (MISCELLANEOUS) ×2 IMPLANT
COVER TIP SHEARS 8MM DA VINCI (MISCELLANEOUS) ×3
COVER WAND RF STERILE (DRAPES) IMPLANT
CUTTER ECHEON FLEX ENDO 45 340 (ENDOMECHANICALS) ×3 IMPLANT
DECANTER SPIKE VIAL GLASS SM (MISCELLANEOUS) ×3 IMPLANT
DERMABOND ADVANCED (GAUZE/BANDAGES/DRESSINGS) ×1
DERMABOND ADVANCED .7 DNX12 (GAUZE/BANDAGES/DRESSINGS) ×2 IMPLANT
DRAIN CHANNEL RND F F (WOUND CARE) IMPLANT
DRAPE ARM DVNC X/XI (DISPOSABLE) ×8 IMPLANT
DRAPE COLUMN DVNC XI (DISPOSABLE) ×2 IMPLANT
DRAPE DA VINCI XI ARM (DISPOSABLE) ×12
DRAPE DA VINCI XI COLUMN (DISPOSABLE) ×3
DRAPE SURG IRRIG POUCH 19X23 (DRAPES) ×3 IMPLANT
DRSG TEGADERM 4X4.75 (GAUZE/BANDAGES/DRESSINGS) ×3 IMPLANT
ELECT PENCIL ROCKER SW 15FT (MISCELLANEOUS) ×3 IMPLANT
ELECT REM PT RETURN 15FT ADLT (MISCELLANEOUS) ×3 IMPLANT
GLOVE SURG ENC MOIS LTX SZ6.5 (GLOVE) ×3 IMPLANT
GLOVE SURG ENC TEXT LTX SZ7.5 (GLOVE) ×6 IMPLANT
GOWN STRL REUS W/TWL LRG LVL3 (GOWN DISPOSABLE) ×12 IMPLANT
HEMOSTAT SURGICEL 2X3 (HEMOSTASIS) ×3 IMPLANT
HOLDER FOLEY CATH W/STRAP (MISCELLANEOUS) ×3 IMPLANT
IRRIG SUCT STRYKERFLOW 2 WTIP (MISCELLANEOUS) ×3
IRRIGATION SUCT STRKRFLW 2 WTP (MISCELLANEOUS) ×2 IMPLANT
KIT TURNOVER KIT A (KITS) ×3 IMPLANT
NDL SAFETY ECLIPSE 18X1.5 (NEEDLE) ×2 IMPLANT
NEEDLE HYPO 18GX1.5 SHARP (NEEDLE) ×3
PACK ROBOT UROLOGY CUSTOM (CUSTOM PROCEDURE TRAY) ×3 IMPLANT
PENCIL SMOKE EVACUATOR (MISCELLANEOUS) IMPLANT
SCISSORS LAP 5X45 EPIX DISP (ENDOMECHANICALS) ×3 IMPLANT
SEAL CANN UNIV 5-8 DVNC XI (MISCELLANEOUS) ×8 IMPLANT
SEAL XI 5MM-8MM UNIVERSAL (MISCELLANEOUS) ×12
SET TUBE SMOKE EVAC HIGH FLOW (TUBING) ×3 IMPLANT
SOLUTION ELECTROLUBE (MISCELLANEOUS) ×3 IMPLANT
STAPLE RELOAD 45 GRN (STAPLE) ×2 IMPLANT
STAPLE RELOAD 45MM GREEN (STAPLE) ×3
SUT ETHILON 3 0 PS 1 (SUTURE) ×3 IMPLANT
SUT MNCRL 3 0 RB1 (SUTURE) ×2 IMPLANT
SUT MNCRL 3 0 VIOLET RB1 (SUTURE) ×2 IMPLANT
SUT MNCRL AB 4-0 PS2 18 (SUTURE) ×6 IMPLANT
SUT MONOCRYL 3 0 RB1 (SUTURE) ×6
SUT VIC AB 0 CT1 27 (SUTURE) ×3
SUT VIC AB 0 CT1 27XBRD ANTBC (SUTURE) ×2 IMPLANT
SUT VIC AB 0 UR5 27 (SUTURE) ×3 IMPLANT
SUT VIC AB 2-0 SH 27 (SUTURE) ×3
SUT VIC AB 2-0 SH 27X BRD (SUTURE) ×2 IMPLANT
SUT VIC AB 3-0 SH 27 (SUTURE) ×3
SUT VIC AB 3-0 SH 27XBRD (SUTURE) ×2 IMPLANT
SUT VICRYL 0 UR6 27IN ABS (SUTURE) ×6 IMPLANT
SYR 27GX1/2 1ML LL SAFETY (SYRINGE) ×3 IMPLANT
TOWEL OR NON WOVEN STRL DISP B (DISPOSABLE) ×3 IMPLANT
TROCAR XCEL NON-BLD 5MMX100MML (ENDOMECHANICALS) IMPLANT
WATER STERILE IRR 1000ML POUR (IV SOLUTION) ×3 IMPLANT

## 2020-05-27 NOTE — Discharge Instructions (Signed)

## 2020-05-27 NOTE — Interval H&P Note (Signed)
History and Physical Interval Note:  05/27/2020 6:53 AM  Stephen [name]  has presented today for surgery, with the diagnosis of PROSTATE CANCER.  The various methods of treatment have been discussed with the patient and family. After consideration of risks, benefits and other options for treatment, the patient has consented to  Procedure(s): XI ROBOTIC ASSISTED LAPAROSCOPIC RADICAL PROSTATECTOMY LEVEL 2 (N/A) LYMPHADENECTOMY, PELVIC (Bilateral) as a surgical intervention.  The patient's history has been reviewed, patient examined, no change in status, stable for surgery.  I have reviewed the patient's chart and labs.  Questions were answered to the patient's satisfaction.     [employer]

## 2020-05-27 NOTE — Anesthesia Procedure Notes (Signed)
Procedure Name: Intubation Date/Time: 05/27/2020 7:22 AM Performed by: Claudia Desanctis, CRNA Pre-anesthesia Checklist: Patient identified, Emergency Drugs available, Suction available and Patient being monitored Patient Re-evaluated:Patient Re-evaluated prior to induction Oxygen Delivery Method: Circle system utilized Preoxygenation: Pre-oxygenation with 100% oxygen Induction Type: IV induction Ventilation: Mask ventilation without difficulty Laryngoscope Size: 2 and Miller Grade View: Grade I Tube type: Oral Number of attempts: 1 Airway Equipment and Method: Stylet Placement Confirmation: ETT inserted through vocal cords under direct vision,  positive ETCO2 and breath sounds checked- equal and bilateral Secured at: 22 cm Tube secured with: Tape Dental Injury: Teeth and Oropharynx as per pre-operative assessment

## 2020-05-27 NOTE — Plan of Care (Signed)
Care plan initiated.

## 2020-05-27 NOTE — Op Note (Signed)
Preoperative diagnosis: Clinically localized adenocarcinoma of the prostate (clinical stage T1c N0 M0)  Postoperative diagnosis: Clinically localized adenocarcinoma of the prostate (clinical stage T1c N0 M0)  Procedure:  1. Robotic assisted laparoscopic radical prostatectomy (bilateral nerve sparing) 2. Bilateral robotic assisted laparoscopic pelvic lymphadenectomy  Surgeon: Pryor Curia. M.D.  Assistant: Debbrah Alar, PA-C  An assistant was required for this surgical procedure.  The duties of the assistant included but were not limited to suctioning, passing suture, camera manipulation, retraction. This procedure would not be able to be performed without an Environmental consultant.  Resident: Dr. Ermelinda Das  Anesthesia: General  Complications: None  EBL: 500 mL  IVF:  2000 mL crystalloid, 500 mL colloid  Specimens: 1. Prostate and seminal vesicles 2. Right pelvic lymph nodes 3. Left pelvic lymph nodes  Disposition of specimens: Pathology  Drains: 1. 20 Fr coude catheter 2. # 19 Blake pelvic drain  Indication: Stephen [name] is a 50 y.o. year old patient with clinically localized prostate cancer.  After a thorough review of the management options for treatment of prostate cancer, he elected to proceed with surgical therapy and the above procedure(s).  We have discussed the potential benefits and risks of the procedure, side effects of the proposed treatment, the likelihood of the patient achieving the goals of the procedure, and any potential problems that might occur during the procedure or recuperation. Informed consent has been obtained.  Description of procedure:  The patient was taken to the operating room and a general anesthetic was administered. He was given preoperative antibiotics, placed in the dorsal lithotomy position, and prepped and draped in the usual sterile fashion. Next a preoperative timeout was performed. A urethral catheter was placed into the bladder and a  site was selected near the umbilicus for placement of the camera port. This was placed using a standard open Hassan technique which allowed entry into the peritoneal cavity under direct vision and without difficulty. An 8 mm robotic port was placed and a pneumoperitoneum established. The camera was then used to inspect the abdomen and there was no evidence of any intra-abdominal injuries or other abnormalities. The remaining abdominal ports were then placed. 8 mm robotic ports were placed in the right lower quadrant, left lower quadrant, and far left lateral abdominal wall. A 5 mm port was placed in the right upper quadrant and a 12 mm port was placed in the right lateral abdominal wall for laparoscopic assistance. All ports were placed under direct vision without difficulty. The surgical cart was then docked.   Utilizing the cautery scissors, the bladder was reflected posteriorly allowing entry into the space of Retzius and identification of the endopelvic fascia and prostate. The periprostatic fat was then removed from the prostate allowing full exposure of the endopelvic fascia. The endopelvic fascia was then incised from the apex back to the base of the prostate bilaterally and the underlying levator muscle fibers were swept laterally off the prostate thereby isolating the dorsal venous complex. The dorsal vein was then stapled and divided with a 45 mm Flex Echelon stapler. Attention then turned to the bladder neck which was divided anteriorly thereby allowing entry into the bladder and exposure of the urethral catheter. The catheter balloon was deflated and the catheter was brought into the operative field and used to retract the prostate anteriorly. The posterior bladder neck was then examined and was divided allowing further dissection between the bladder and prostate posteriorly until the vasa deferentia and seminal vessels were identified. The vasa  deferentia were isolated, divided, and lifted anteriorly.  The seminal vesicles were dissected down to their tips with care to control the seminal vascular arterial blood supply. These structures were then lifted anteriorly and the space between Denonvillier's fascia and the anterior rectum was developed with a combination of sharp and blunt dissection. This isolated the vascular pedicles of the prostate.  The lateral prostatic fascia was then sharply incised allowing release of the neurovascular bundles bilaterally. The vascular pedicles of the prostate were then ligated with Weck clips between the prostate and neurovascular bundles and divided with sharp cold scissor dissection resulting in neurovascular bundle preservation. The neurovascular bundles were then separated off the apex of the prostate and urethra bilaterally.  The urethra was then sharply transected allowing the prostate specimen to be disarticulated. The pelvis was copiously irrigated and hemostasis was ensured. 3-0 vicryl sutures were placed around bleeding veins near the neurovascular bundles to control them. There was no evidence for rectal injury.  Attention then turned to the right pelvic sidewall. The fibrofatty tissue between the external iliac vein, confluence of the iliac vessels, hypogastric artery, and Cooper's ligament was dissected free from the pelvic sidewall with care to preserve the obturator nerve. Weck clips were used for lymphostasis and hemostasis. An identical procedure was performed on the contralateral side and the lymphatic packets were removed for permanent pathologic analysis.  Attention then turned to the urethral anastomosis. A 2-0 Vicryl slip knot was placed between Denonvillier's fascia, the posterior bladder neck, and the posterior urethra to reapproximate these structures. A double-armed 3-0 Monocryl suture was then used to perform a 360 running tension-free anastomosis between the bladder neck and urethra. A new urethral catheter was then placed into the bladder  and irrigated. There were no blood clots within the bladder and the anastomosis appeared to be watertight. A #19 Blake drain was then brought through the left lateral 8 mm port site and positioned appropriately within the pelvis. It was secured to the skin with a nylon suture. The surgical cart was then undocked. The right lateral 12 mm port site was closed at the fascial level with a 0 Vicryl suture placed laparoscopically. All remaining ports were then removed under direct vision. The prostate specimen was removed intact within the Endopouch retrieval bag via the periumbilical camera port site. This fascial opening was closed with two running 0 Vicryl sutures. 0.25% Marcaine was then injected into all port sites and all incisions were reapproximated at the skin level with 4-0 Monocryl subcuticular sutures and Dermabond. The patient appeared to tolerate the procedure well and without complications. The patient was able to be extubated and transferred to the recovery unit in satisfactory condition.   Pryor Curia MD

## 2020-05-27 NOTE — Transfer of Care (Signed)
Immediate Anesthesia Transfer of Care Note  Patient: Stephen [name]  Procedure(s) Performed: XI ROBOTIC ASSISTED LAPAROSCOPIC RADICAL PROSTATECTOMY LEVEL 2 (N/A ) LYMPHADENECTOMY, PELVIC (Bilateral )  Patient Location: PACU  Anesthesia Type:General  Level of Consciousness: awake, alert , oriented and patient cooperative  Airway & Oxygen Therapy: Patient Spontanous Breathing and Patient connected to face mask  Post-op Assessment: Report given to RN and Post -op Vital signs reviewed and stable  Post vital signs: Reviewed and stable  Last Vitals:  Vitals Value Taken Time  BP 153/90 05/27/20 1140  Temp    Pulse 103 05/27/20 1141  Resp 23 05/27/20 1141  SpO2 100 % 05/27/20 1141  Vitals shown include unvalidated device data.  Last Pain:  Vitals:   05/27/20 0610  TempSrc: Oral  PainSc:          Complications: No complications documented.

## 2020-05-27 NOTE — Progress Notes (Signed)
Patient ID: Stephen [name], male   DOB: 03/02/1970, 50 y.o.   MRN: [id number]  Post-op note  Subjective: The patient is doing well.  No complaints.  Objective: Vital signs in last 24 hours: Temp:  [97.5 F (36.4 C)-99.2 F (37.3 C)] 98.7 F (37.1 C) (05/12 1453) Pulse Rate:  [77-102] 88 (05/12 1453) Resp:  [9-18] 16 (05/12 1453) BP: (131-163)/(85-111) 157/111 (05/12 1453) SpO2:  [96 %-100 %] 98 % (05/12 1453) Weight:  [130.2 kg] 130.2 kg (05/12 0511)  Intake/Output from previous day: No intake/output data recorded. Intake/Output this shift: Total I/O In: 4000 [I.V.:2400; IV Piggyback:1600] Out: 1380 [Urine:800; Drains:80; Blood:500]  Physical Exam:  General: Alert and oriented. Abdomen: Soft, Nondistended. Incisions: Clean and dry. GU: Urine clear.  Lab Results: Recent Labs    05/27/20 1155  HGB 12.9*  HCT 37.3*    Assessment/Plan: POD#0   1) Continue to monitor, ambulate, IS   Stephen [name]. MD   LOS: 0 days   Stephen [name] 05/27/2020, 4:51 PM

## 2020-05-27 NOTE — Anesthesia Postprocedure Evaluation (Signed)
Anesthesia Post Note  Patient: Stephen [name]  Procedure(s) Performed: XI ROBOTIC ASSISTED LAPAROSCOPIC RADICAL PROSTATECTOMY LEVEL 2 (N/A ) LYMPHADENECTOMY, PELVIC (Bilateral )     Patient location during evaluation: PACU Anesthesia Type: General Level of consciousness: awake and alert Pain management: pain level controlled Vital Signs Assessment: post-procedure vital signs reviewed and stable Respiratory status: spontaneous breathing, nonlabored ventilation, respiratory function stable and patient connected to nasal cannula oxygen Cardiovascular status: blood pressure returned to baseline and stable Postop Assessment: no apparent nausea or vomiting Anesthetic complications: no   No complications documented.  Last Vitals:  Vitals:   05/27/20 1330 05/27/20 1344  BP: (!) 146/90   Pulse: 81   Resp: 11 14  Temp:    SpO2: 98%     Last Pain:  Vitals:   05/27/20 1328  TempSrc:   PainSc: 2                  Lachae Hohler S

## 2020-05-28 ENCOUNTER — Telehealth: Payer: Self-pay | Admitting: Pulmonary Disease

## 2020-05-28 ENCOUNTER — Encounter (HOSPITAL_COMMUNITY): Payer: Self-pay | Admitting: Urology

## 2020-05-28 DIAGNOSIS — Z85828 Personal history of other malignant neoplasm of skin: Secondary | ICD-10-CM | POA: Diagnosis not present

## 2020-05-28 DIAGNOSIS — G4719 Other hypersomnia: Secondary | ICD-10-CM

## 2020-05-28 DIAGNOSIS — G4733 Obstructive sleep apnea (adult) (pediatric): Secondary | ICD-10-CM

## 2020-05-28 DIAGNOSIS — C61 Malignant neoplasm of prostate: Secondary | ICD-10-CM | POA: Diagnosis not present

## 2020-05-28 LAB — HEMOGLOBIN AND HEMATOCRIT, BLOOD
HCT: 35.8 % — ABNORMAL LOW (ref 39.0–52.0)
Hemoglobin: 12.3 g/dL — ABNORMAL LOW (ref 13.0–17.0)

## 2020-05-28 MED ORDER — TRAMADOL HCL 50 MG PO TABS: 50.0000 mg | ORAL_TABLET | Freq: Four times a day (QID) | ORAL | Status: DC | PRN

## 2020-05-28 MED ORDER — BISACODYL 10 MG RE SUPP
10.0000 mg | Freq: Once | RECTAL | Status: DC
  Filled 2020-05-28: qty 1

## 2020-05-28 NOTE — Discharge Summary (Signed)
  Date of admission: 05/27/2020  Date of discharge: 05/28/2020  Admission diagnosis: Prostate Cancer  Discharge diagnosis: Prostate Cancer  History and Physical: For full details, please see admission history and physical. Briefly, Stephen [name] is a 50 y.o. gentleman with localized prostate cancer.  After discussing management/treatment options, he elected to proceed with surgical treatment.  Hospital Course: Stephen [name] was taken to the operating room on 05/27/2020 and underwent a robotic assisted laparoscopic radical prostatectomy. He tolerated this procedure well and without complications. Postoperatively, he was able to be transferred to a regular hospital room following recovery from anesthesia.  He was able to begin ambulating the night of surgery. He remained hemodynamically stable overnight.  He had excellent urine output with appropriately minimal output from his pelvic drain and his pelvic drain was removed on POD #1.  He was transitioned to oral pain medication, tolerated a clear liquid diet, and had met all discharge criteria and was able to be discharged home later on POD#1.  Laboratory values: Recent Labs    05/27/20 1155 05/28/20 0501  HGB 12.9* 12.3*  HCT 37.3* 35.8*    Disposition: Home  Discharge instruction: He was instructed to be ambulatory but to refrain from heavy lifting, strenuous activity, or driving. He was instructed on urethral catheter care.  Discharge medications:   Allergies as of 05/28/2020      Reactions   Penicillins Nausea Only   Unknown - Childhood allergy   Nsaids    Pt told to avoid       Medication List    STOP taking these medications   MULTIVITAMIN PO   tamsulosin 0.4 MG Caps capsule Commonly known as: FLOMAX     TAKE these medications   cyclobenzaprine 10 MG tablet Commonly known as: FLEXERIL TAKE 1 TABLET BY MOUTH AS NEEDED FOR MUSCLE SPASMS. What changed: See the new instructions.   docusate sodium 100 MG capsule Commonly  known as: COLACE Take 1 capsule (100 mg total) by mouth 2 (two) times daily.   sulfamethoxazole-trimethoprim 800-160 MG tablet Commonly known as: BACTRIM DS Take 1 tablet by mouth 2 (two) times daily. Start the day prior to foley removal appointment   traMADol 50 MG tablet Commonly known as: Ultram Take 1-2 tablets (50-100 mg total) by mouth every 6 (six) hours as needed for moderate pain or severe pain.       Followup: He will followup in 1 week for catheter removal and to discuss his surgical pathology results.

## 2020-05-28 NOTE — Progress Notes (Signed)
Patient ID: Stephen [name], male   DOB: Dec 29, 1970, 50 y.o.   MRN: [id number]  1 Day Post-Op Subjective: The patient is doing well.  No nausea or vomiting. Pain is adequately controlled.  Objective: Vital signs in last 24 hours: Temp:  [97.5 F (36.4 C)-99 F (37.2 C)] 98.6 F (37 C) (05/13 0603) Pulse Rate:  [71-102] 71 (05/13 0603) Resp:  [9-18] 18 (05/13 0603) BP: (103-163)/(70-111) 103/74 (05/13 0603) SpO2:  [96 %-100 %] 98 % (05/13 0603)  Intake/Output from previous day: 05/12 0701 - 05/13 0700 In: 5925.2 [I.V.:3925.2; IV Piggyback:2000] Out: 8916 [Urine:2900; Drains:165; Blood:500] Intake/Output this shift: No intake/output data recorded.  Physical Exam:  General: Alert and oriented. CV: RRR Lungs: Clear bilaterally. GI: Soft, Nondistended. Incisions: Clean, dry, and intact Urine: Clear Extremities: Nontender, no erythema, no edema.  Lab Results: Recent Labs    05/27/20 1155 05/28/20 0501  HGB 12.9* 12.3*  HCT 37.3* 35.8*      Assessment/Plan: POD# 1 s/p robotic prostatectomy.  1) SL IVF 2) Ambulate, Incentive spirometry 3) Transition to oral pain medication 4) Dulcolax suppository 5) D/C pelvic drain 6) Plan for likely discharge later today   Pryor Curia. MD   LOS: 0 days   [name] 05/28/2020, 7:03 AM

## 2020-05-28 NOTE — Telephone Encounter (Signed)
Per Leory Plowman w/ [employer], in order to process the DME order placed on 05/25/20, they need the following:  I received the below email back from our processing team :   Documentation needed: We need an additional qualifying symptom; I've looked everywhere and couldn't find anything. The most recent HST has "daytime sleepiness",  if they can put excessive daytime sleepiness either on the script or in notes we'd be set!    Qualifying AQS - Excessive daytime sleepiness (ESS 10 or over), impaired cognition (memory issues), mood disorders (depression, ADD, ADHD, anxiety, PTSD, etc.), insomnia, hypertension, ischemic heart disease or history of stroke, hypersomnia, pulmonary HTN, or daytime hypersomnolence. "

## 2020-05-28 NOTE — Telephone Encounter (Signed)
Placed new cpap order with diagnosis of OSA and excessive daytime sleepiness.

## 2020-06-03 LAB — SURGICAL PATHOLOGY

## 2020-06-08 NOTE — Progress Notes (Deleted)
   I, [name], LAT, ATC, am serving as scribe for Dr. Lynne Leader.  Stephen [name] is a 50 y.o. male who presents to [place] [facility] today for f/u of L shoulder adhesive capsulitis.  He was last seen by Dr. Georgina Snell on 04/28/20 and noted improvement in his L shoulder pain and ROM.  He was advised to con't PT and has now completed 12 sessions.  Since his last visit, pt reports   Diagnostic imaging: L shoulder XR- 03/18/20  Pertinent review of systems: ***  Relevant historical information: ***   Exam:  There were no vitals taken for this visit. General: Well Developed, well nourished, and in no acute distress.   MSK: ***    Lab and Radiology Results No results found for this or any previous visit (from the past 72 hour(s)). No results found.     Assessment and Plan: 49 y.o. male with ***   PDMP not reviewed this encounter. No orders of the defined types were placed in this encounter.  No orders of the defined types were placed in this encounter.    Discussed warning signs or symptoms. Please see discharge instructions. Patient expresses understanding.   ***

## 2020-06-09 ENCOUNTER — Ambulatory Visit: Payer: BC Managed Care – PPO | Admitting: Family Medicine

## 2020-06-24 DIAGNOSIS — N393 Stress incontinence (female) (male): Secondary | ICD-10-CM | POA: Diagnosis not present

## 2020-06-24 DIAGNOSIS — M6281 Muscle weakness (generalized): Secondary | ICD-10-CM | POA: Diagnosis not present

## 2020-06-24 DIAGNOSIS — M62838 Other muscle spasm: Secondary | ICD-10-CM | POA: Diagnosis not present

## 2020-07-13 DIAGNOSIS — M6281 Muscle weakness (generalized): Secondary | ICD-10-CM | POA: Diagnosis not present

## 2020-07-13 DIAGNOSIS — M62838 Other muscle spasm: Secondary | ICD-10-CM | POA: Diagnosis not present

## 2020-07-13 DIAGNOSIS — N393 Stress incontinence (female) (male): Secondary | ICD-10-CM | POA: Diagnosis not present

## 2020-08-03 DIAGNOSIS — M6281 Muscle weakness (generalized): Secondary | ICD-10-CM | POA: Diagnosis not present

## 2020-08-03 DIAGNOSIS — M62838 Other muscle spasm: Secondary | ICD-10-CM | POA: Diagnosis not present

## 2020-08-03 DIAGNOSIS — N393 Stress incontinence (female) (male): Secondary | ICD-10-CM | POA: Diagnosis not present

## 2020-08-27 DIAGNOSIS — C61 Malignant neoplasm of prostate: Secondary | ICD-10-CM | POA: Diagnosis not present

## 2020-09-08 DIAGNOSIS — C61 Malignant neoplasm of prostate: Secondary | ICD-10-CM | POA: Diagnosis not present

## 2020-09-08 DIAGNOSIS — N393 Stress incontinence (female) (male): Secondary | ICD-10-CM | POA: Diagnosis not present

## 2020-09-08 DIAGNOSIS — N5201 Erectile dysfunction due to arterial insufficiency: Secondary | ICD-10-CM | POA: Diagnosis not present

## 2020-09-10 ENCOUNTER — Telehealth: Payer: Self-pay | Admitting: Radiation Oncology

## 2020-09-15 ENCOUNTER — Encounter: Payer: Self-pay | Admitting: Family Medicine

## 2020-09-15 ENCOUNTER — Ambulatory Visit (INDEPENDENT_AMBULATORY_CARE_PROVIDER_SITE_OTHER): Payer: BC Managed Care – PPO | Admitting: Family Medicine

## 2020-09-15 ENCOUNTER — Other Ambulatory Visit: Payer: Self-pay

## 2020-09-15 VITALS — BP 112/74 | HR 71 | Temp 97.8°F | Ht 73.0 in | Wt 291.4 lb

## 2020-09-15 DIAGNOSIS — E669 Obesity, unspecified: Secondary | ICD-10-CM

## 2020-09-15 DIAGNOSIS — Z13220 Encounter for screening for lipoid disorders: Secondary | ICD-10-CM | POA: Diagnosis not present

## 2020-09-15 DIAGNOSIS — G4733 Obstructive sleep apnea (adult) (pediatric): Secondary | ICD-10-CM

## 2020-09-15 DIAGNOSIS — M7502 Adhesive capsulitis of left shoulder: Secondary | ICD-10-CM

## 2020-09-15 DIAGNOSIS — C61 Malignant neoplasm of prostate: Secondary | ICD-10-CM | POA: Diagnosis not present

## 2020-09-15 DIAGNOSIS — R7303 Prediabetes: Secondary | ICD-10-CM

## 2020-09-15 DIAGNOSIS — Z6838 Body mass index (BMI) 38.0-38.9, adult: Secondary | ICD-10-CM

## 2020-09-15 DIAGNOSIS — Z0001 Encounter for general adult medical examination with abnormal findings: Secondary | ICD-10-CM

## 2020-09-15 LAB — COMPREHENSIVE METABOLIC PANEL
ALT: 13 U/L (ref 0–53)
AST: 16 U/L (ref 0–37)
Albumin: 4 g/dL (ref 3.5–5.2)
Alkaline Phosphatase: 67 U/L (ref 39–117)
BUN: 18 mg/dL (ref 6–23)
CO2: 28 mEq/L (ref 19–32)
Calcium: 9.5 mg/dL (ref 8.4–10.5)
Chloride: 105 mEq/L (ref 96–112)
Creatinine, Ser: 1.24 mg/dL (ref 0.40–1.50)
GFR: 68.74 mL/min (ref >60.00)
Glucose, Bld: 72 mg/dL (ref 70–99)
Potassium: 3.8 mEq/L (ref 3.5–5.1)
Sodium: 139 mEq/L (ref 135–145)
Total Bilirubin: 0.6 mg/dL (ref 0.2–1.2)
Total Protein: 7.3 g/dL (ref 6.0–8.3)

## 2020-09-15 LAB — LIPID PANEL
Cholesterol: 156 mg/dL (ref 0–200)
HDL: 46.7 mg/dL (ref >39.00)
LDL Cholesterol: 93 mg/dL (ref 0–99)
NonHDL: 109.63
Total CHOL/HDL Ratio: 3
Triglycerides: 85 mg/dL (ref 0.0–149.0)
VLDL: 17 mg/dL (ref 0.0–40.0)

## 2020-09-15 LAB — CBC
HCT: 43.5 % (ref 39.0–52.0)
Hemoglobin: 14.6 g/dL (ref 13.0–17.0)
MCHC: 33.6 g/dL (ref 30.0–36.0)
MCV: 91.1 fl (ref 78.0–100.0)
Platelets: 172 10*3/uL (ref 150.0–400.0)
RBC: 4.77 Mil/uL (ref 4.22–5.81)
RDW: 13 % (ref 11.5–15.5)
WBC: 4.3 10*3/uL (ref 4.0–10.5)

## 2020-09-15 LAB — TSH: TSH: 2.19 u[IU]/mL (ref 0.35–5.50)

## 2020-09-15 LAB — HEMOGLOBIN A1C: Hgb A1c MFr Bld: 5.5 % (ref 4.6–6.5)

## 2020-09-15 NOTE — Assessment & Plan Note (Signed)
He is waiting on getting CPAP supplies.

## 2020-09-15 NOTE — Progress Notes (Signed)
Chief Complaint:  Stephen [name] is a 50 y.o. male who presents today for his annual comprehensive physical exam.    Assessment/Plan:  Chronic Problems Addressed Today: Adhesive capsulitis of left shoulder Follows with PT and sports med.  Symptoms are improving.  Prostate cancer [facility]) s/p prostatectomy 2022 Following with urology and oncology.  Doing well status post prostatectomy.  Prediabetes   Check A1c.  OSA (obstructive sleep apnea) He is waiting on getting CPAP supplies.   Body mass index is 38.45 kg/m. / Obese  BMI Metric Follow Up - 09/15/20 0943       BMI Metric Follow Up-Please document annually   BMI Metric Follow Up Education provided              Preventative Healthcare: Check Labs . UTD on other vaccines and screenings  Patient Counseling(The following topics were reviewed and/or handout was given):  -Nutrition: Stressed importance of moderation in sodium/caffeine intake, saturated fat and cholesterol, caloric balance, sufficient intake of fresh fruits, vegetables, and fiber.  -Stressed the importance of regular exercise.   -Substance Abuse: Discussed cessation/primary prevention of tobacco, alcohol, or other drug use; driving or other dangerous activities under the influence; availability of treatment for abuse.   -Injury prevention: Discussed safety belts, safety helmets, smoke detector, smoking near bedding or upholstery.   -Sexuality: Discussed sexually transmitted diseases, partner selection, use of condoms, avoidance of unintended pregnancy and contraceptive alternatives.   -Dental health: Discussed importance of regular tooth brushing, flossing, and dental visits.  -Health maintenance and immunizations reviewed. Please refer to Health maintenance section.  Return to care in 1 year for next preventative visit.     Subjective:  HPI:  He has no acute complaints today.   Lifestyle Diet: None specific Exercise: Can improve regimen  Depression  screen [facility] 2/9 09/15/2020  Decreased Interest 0  Down, Depressed, Hopeless 0  PHQ - 2 Score 0  Altered sleeping -  Tired, decreased energy -  Change in appetite -  Feeling bad or failure about yourself  -  Trouble concentrating -  Moving slowly or fidgety/restless -  Suicidal thoughts -  PHQ-9 Score -  Difficult doing work/chores -    Health Maintenance Due  Topic Date Due   Pneumococcal Vaccine 30-26 Years old (1 - PCV) Never done   Hepatitis C Screening  Never done   COVID-19 Vaccine (4 - Booster for Pfizer series) 02/23/2020   INFLUENZA VACCINE  08/16/2020     ROS: Per HPI, otherwise a complete review of systems was negative.   PMH:  The following were reviewed and entered/updated in epic: Past Medical History:  Diagnosis Date   Basal cell carcinoma    multiple skin cancers removed, prostate cancer    Degenerative disc disease, lumbar    Left shoulder pain    frozen shoulder per tp    Ruptured cervical disc    Sleep apnea    no cpap   Patient Active Problem List   Diagnosis Date Noted   Adhesive capsulitis of left shoulder 04/28/2020   Adverse reaction to NSAIDs 12/16/2019   Prostate cancer [facility]) s/p prostatectomy 2022 10/21/2019   Tinnitus of both ears 09/15/2019   Recurrent low back pain 09/13/2018   Stasis dermatitis 09/13/2018   Prediabetes 02/03/2017   Bilateral lower extremity edema 02/02/2017   OSA (obstructive sleep apnea) 02/02/2017   Past Surgical History:  Procedure Laterality Date   APPENDECTOMY     EYE MUSCLE SURGERY     2010/2011--lazy  LYMPHADENECTOMY Bilateral 05/27/2020   Procedure: LYMPHADENECTOMY, PELVIC;  Surgeon: Raynelle Bring, MD;  Location: [facility];  Service: Urology;  Laterality: Bilateral;   ROBOT ASSISTED LAPAROSCOPIC RADICAL PROSTATECTOMY N/A 05/27/2020   Procedure: XI ROBOTIC ASSISTED LAPAROSCOPIC RADICAL PROSTATECTOMY LEVEL 2;  Surgeon: Raynelle Bring, MD;  Location: [facility];  Service: Urology;  Laterality: N/A;   WISDOM TOOTH  EXTRACTION      Family History  Problem Relation Age of Onset   Hypertension Mother    Ovarian cancer Maternal Grandmother 66   Esophageal cancer Maternal Grandfather 66   Colon cancer Neg Hx    Colon polyps Neg Hx    Rectal cancer Neg Hx    Stomach cancer Neg Hx     Medications- reviewed and updated Current Outpatient Medications  Medication Sig Dispense Refill   cyclobenzaprine (FLEXERIL) 10 MG tablet TAKE 1 TABLET BY MOUTH AS NEEDED FOR MUSCLE SPASMS. (Patient taking differently: Take 10 mg by mouth 3 (three) times daily as needed for muscle spasms.) 30 tablet 0   No current facility-administered medications for this visit.    Allergies-reviewed and updated Allergies  Allergen Reactions   Penicillins Nausea Only    Unknown - Childhood allergy   Nsaids     Pt told to avoid     Social History   Socioeconomic History   Marital status: Married    Spouse name: Not on file   Number of children: Not on file   Years of education: Not on file   Highest education level: Not on file  Occupational History   Not on file  Tobacco Use   Smoking status: Never   Smokeless tobacco: Never  Vaping Use   Vaping Use: Never used  Substance and Sexual Activity   Alcohol use: Yes    Alcohol/week: 4.0 standard drinks    Types: 4 Standard drinks or equivalent per week    Comment: Socially   Drug use: No   Sexual activity: Yes    Partners: Female  Other Topics Concern   Not on file  Social History Narrative   Not on file   Social Determinants of Health   Financial Resource Strain: Not on file  Food Insecurity: Not on file  Transportation Needs: Not on file  Physical Activity: Not on file  Stress: Not on file  Social Connections: Not on file        Objective:  Physical Exam: BP 112/74   Pulse 71   Temp 97.8 F (36.6 C) (Temporal)   Ht '6\' 1"'$  (1.854 m)   Wt 291 lb 6.4 oz (132.2 kg)   SpO2 99%   BMI 38.45 kg/m   Body mass index is 38.45 kg/m. Wt Readings from  Last 3 Encounters:  09/15/20 291 lb 6.4 oz (132.2 kg)  05/27/20 287 lb 0.6 oz (130.2 kg)  05/18/20 287 lb (130.2 kg)   Gen: NAD, resting comfortably HEENT: TMs normal bilaterally. OP clear. No thyromegaly noted.  CV: RRR with no murmurs appreciated Pulm: NWOB, CTAB with no crackles, wheezes, or rhonchi GI: Normal bowel sounds present. Soft, Nontender, Nondistended. MSK: no edema, cyanosis, or clubbing noted Skin: warm, dry Neuro: CN2-12 grossly intact. Strength 5/5 in upper and lower extremities. Reflexes symmetric and intact bilaterally.  Psych: Normal affect and thought content     I,Jordan Kelly,acting as a scribe for Dimas Chyle, MD.,have documented all relevant documentation on the behalf of Dimas Chyle, MD,as directed by  Dimas Chyle, MD while in the presence of Dimas Chyle,  MD.  Nida Boatman, MD, have reviewed all documentation for this visit. The documentation on 09/15/20 for the exam, diagnosis, procedures, and orders are all accurate and complete.  Algis Greenhouse. Jerline Pain, MD 09/15/2020 9:45 AM

## 2020-09-15 NOTE — Patient Instructions (Signed)
It was very nice to see you today!  I will check blood work today.  Please continue working on diet and exercise.  I will see back in year for your next physical.  Come back to see me sooner if needed.  Take care, Dr Jerline Pain  PLEASE NOTE:  If you had any lab tests please let us know if you have not heard back within a few days. You may see your results on mychart before we have a chance to review them but we will give you a call once they are reviewed by [place]. If we ordered any referrals today, please let us know if you have not heard from their office within the next week.   Please try these tips to maintain a healthy lifestyle:  Eat at least 3 REAL meals and 1-2 snacks per day.  Aim for no more than 5 hours between eating.  If you eat breakfast, please do so within one hour of getting up.   Each meal should contain half fruits/vegetables, one quarter protein, and one quarter carbs (no bigger than a computer mouse)  Cut down on sweet beverages. This includes juice, soda, and sweet tea.   Drink at least 1 glass of water with each meal and aim for at least 8 glasses per day  Exercise at least 150 minutes every week.    Preventive Care 47-67 Years Old, Male Preventive care refers to lifestyle choices and visits with your health care provider that can promote health and wellness. This includes: A yearly physical exam. This is also called an annual wellness visit. Regular dental and eye exams. Immunizations. Screening for certain conditions. Healthy lifestyle choices, such as: Eating a healthy diet. Getting regular exercise. Not using drugs or products that contain nicotine and tobacco. Limiting alcohol use. What can I expect for my preventive care visit? Physical exam Your health care provider will check your: Height and weight. These may be used to calculate your BMI (body mass index). BMI is a measurement that tells if you are at a healthy weight. Heart rate and blood  pressure. Body temperature. Skin for abnormal spots. Counseling Your health care provider may ask you questions about your: Past medical problems. Family's medical history. Alcohol, tobacco, and drug use. Emotional well-being. Home life and relationship well-being. Sexual activity. Diet, exercise, and sleep habits. Work and work Statistician. Access to firearms. What immunizations do I need? Vaccines are usually given at various ages, according to a schedule. Your health care provider will recommend vaccines for you based on your age, medical history, and lifestyle or other factors, such as travel or where you work. What tests do I need? Blood tests Lipid and cholesterol levels. These may be checked every 5 years, or more often if you are over 86 years old. Hepatitis C test. Hepatitis B test. Screening Lung cancer screening. You may have this screening every year starting at age 83 if you have a 30-pack-year history of smoking and currently smoke or have quit within the past 15 years. Prostate cancer screening. Recommendations will vary depending on your family history and other risks. Genital exam to check for testicular cancer or hernias. Colorectal cancer screening. All adults should have this screening starting at age 18 and continuing until age 21. Your health care provider may recommend screening at age 54 if you are at increased risk. You will have tests every 1-10 years, depending on your results and the type of screening test. Diabetes screening. This is done by  checking your blood sugar (glucose) after you have not eaten for a while (fasting). You may have this done every 1-3 years. STD (sexually transmitted disease) testing, if you are at risk. Follow these instructions at home: Eating and drinking  Eat a diet that includes fresh fruits and vegetables, whole grains, lean protein, and low-fat dairy products. Take vitamin and mineral supplements as recommended by your  health care provider. Do not drink alcohol if your health care provider tells you not to drink. If you drink alcohol: Limit how much you have to 0-2 drinks a day. Be aware of how much alcohol is in your drink. In the [place] one drink equals one 12 oz bottle of beer (355 mL), one 5 oz glass of wine (148 mL), or one 1 oz glass of hard liquor (44 mL). Lifestyle Take daily care of your teeth and gums. Brush your teeth every morning and night with fluoride toothpaste. Floss one time each day. Stay active. Exercise for at least 30 minutes 5 or more days each week. Do not use any products that contain nicotine or tobacco, such as cigarettes, e-cigarettes, and chewing tobacco. If you need help quitting, ask your health care provider. Do not use drugs. If you are sexually active, practice safe sex. Use a condom or other form of protection to prevent STIs (sexually transmitted infections). If told by your health care provider, take low-dose aspirin daily starting at age 80. Find healthy ways to cope with stress, such as: Meditation, yoga, or listening to music. Journaling. Talking to a trusted person. Spending time with friends and family. Safety Always wear your seat belt while driving or riding in a vehicle. Do not drive: If you have been drinking alcohol. Do not ride with someone who has been drinking. When you are tired or distracted. While texting. Wear a helmet and other protective equipment during sports activities. If you have firearms in your house, make sure you follow all gun safety procedures. What's next? Go to your health care provider once a year for an annual wellness visit. Ask your health care provider how often you should have your eyes and teeth checked. Stay up to date on all vaccines. This information is not intended to replace advice given to you by your health care provider. Make sure you discuss any questions you have with your health care provider. Document Revised:  03/12/2020 Document Reviewed: 12/27/2017 Elsevier Patient Education  2022 [employer].

## 2020-09-15 NOTE — Assessment & Plan Note (Signed)
Follows with PT and sports med.  Symptoms are improving.

## 2020-09-15 NOTE — Assessment & Plan Note (Signed)
Following with urology and oncology.  Doing well status post prostatectomy.

## 2020-09-15 NOTE — Assessment & Plan Note (Signed)
Check A1c. 

## 2020-09-17 ENCOUNTER — Ambulatory Visit
Admission: RE | Admit: 2020-09-17 | Discharge: 2020-09-17 | Disposition: A | Discharge status: Home / Self Care | Payer: BC Managed Care – PPO | Source: Ambulatory Visit | Attending: Radiation Oncology | Admitting: Radiation Oncology

## 2020-09-17 ENCOUNTER — Telehealth: Payer: Self-pay

## 2020-09-17 ENCOUNTER — Encounter: Payer: Self-pay | Admitting: Radiation Oncology

## 2020-09-17 ENCOUNTER — Ambulatory Visit: Payer: BC Managed Care – PPO

## 2020-09-17 ENCOUNTER — Other Ambulatory Visit: Payer: Self-pay

## 2020-09-17 DIAGNOSIS — C61 Malignant neoplasm of prostate: Secondary | ICD-10-CM | POA: Diagnosis not present

## 2020-09-17 HISTORY — DX: Malignant neoplasm of prostate: C61

## 2020-09-17 NOTE — Telephone Encounter (Signed)
Spoke with patient we will change his appointment to My Chart. Spoke with scheduling to set up for my chart visit

## 2020-09-17 NOTE — Progress Notes (Signed)
Please inform patient of the following:  Good news! His labs are all NORMAL. Do not need to make any changes to his treatment plan at this time. Would like for him to keep up the good work with diet and exercise and we can recheck in a year.  Stephen [name]. Jerline Pain, MD 09/17/2020 11:29 AM

## 2020-09-17 NOTE — Progress Notes (Signed)
Radiation Oncology         (336) [phone] ________________________________  Initial Outpatient Consultation - Conducted via MyChart due to current COVID-19 concerns for limiting patient exposure  Name: Stephen [name] MRN: [id number]  Date: 09/17/2020  DOB: May 18, 1970  [id number], Algis Greenhouse, MD  Raynelle Bring, MD   REFERRING PHYSICIAN: Raynelle Bring, MD  DIAGNOSIS: 50 y.o. gentleman with a detectable PSA of 0.084 s/p RALP 05/2020 for Stage pT3 pN0, Gleason 4+4 prostate cancer    ICD-10-CM   1. Prostate cancer [facility]) s/p prostatectomy 2022  C61       HISTORY OF PRESENT ILLNESS: Stephen [name] is a 50 y.o. male with a diagnosis of prostate cancer. He was initially diagnosed with Gleason 4+3 prostate cancer on biopsy on 03/15/20. PSA at time of diagnosis was 11.6. A CT pelvis performed on 04/14/20 showed no signs of pelvic nodal disease or suspicious bone abnormality. He opted to proceed with RALP on 05/27/20 under the care of Dr. Alinda Money. Final surgical pathology revealed Gleason 4+4 prostatic adenocarcinoma with a broadly positive left posterior margin and perineural invasion and it was unclear if the area of positive margin represented incision in an area of extraprostatic extension or just incision into the tumor. His postoperative PSA was low but remained detectable at 0.084 on 08/31/20.  He has regained full continence and is making progress regarding erectile function since the time of surgery.  The patient reviewed the surgical pathology and postoperative PSA results with his urologist and he has kindly been referred today for discussion of potential adjuvant radiation treatment options.   PREVIOUS RADIATION THERAPY: No  PAST MEDICAL HISTORY:  Past Medical History:  Diagnosis Date   Basal cell carcinoma    multiple skin cancers removed, prostate cancer    Degenerative disc disease, lumbar    Left shoulder pain    frozen shoulder per tp    Prostate CA ([place])    Ruptured cervical disc     Sleep apnea    no cpap      PAST SURGICAL HISTORY: Past Surgical History:  Procedure Laterality Date   APPENDECTOMY     EYE MUSCLE SURGERY     2010/2011--lazy   LYMPHADENECTOMY Bilateral 05/27/2020   Procedure: LYMPHADENECTOMY, PELVIC;  Surgeon: Raynelle Bring, MD;  Location: [facility];  Service: Urology;  Laterality: Bilateral;   ROBOT ASSISTED LAPAROSCOPIC RADICAL PROSTATECTOMY N/A 05/27/2020   Procedure: XI ROBOTIC ASSISTED LAPAROSCOPIC RADICAL PROSTATECTOMY LEVEL 2;  Surgeon: Raynelle Bring, MD;  Location: [facility];  Service: Urology;  Laterality: N/A;   WISDOM TOOTH EXTRACTION      FAMILY HISTORY:  Family History  Problem Relation Age of Onset   Hypertension Mother    Ovarian cancer Maternal Grandmother 65   Esophageal cancer Maternal Grandfather 29   Colon cancer Neg Hx    Colon polyps Neg Hx    Rectal cancer Neg Hx    Stomach cancer Neg Hx     SOCIAL HISTORY:  Social History   Socioeconomic History   Marital status: Married    Spouse name: Not on file   Number of children: Not on file   Years of education: Not on file   Highest education level: Not on file  Occupational History   Not on file  Tobacco Use   Smoking status: Never   Smokeless tobacco: Never  Vaping Use   Vaping Use: Never used  Substance and Sexual Activity   Alcohol use: Yes    Alcohol/week: 4.0 standard drinks  Types: 4 Standard drinks or equivalent per week    Comment: Socially   Drug use: No   Sexual activity: Yes    Partners: Female  Other Topics Concern   Not on file  Social History Narrative   Not on file   Social Determinants of Health   Financial Resource Strain: Not on file  Food Insecurity: Not on file  Transportation Needs: Not on file  Physical Activity: Not on file  Stress: Not on file  Social Connections: Not on file  Intimate Partner Violence: Not on file    ALLERGIES: Penicillins and Nsaids  MEDICATIONS:  Current Outpatient Medications  Medication Sig Dispense  Refill   cyclobenzaprine (FLEXERIL) 10 MG tablet TAKE 1 TABLET BY MOUTH AS NEEDED FOR MUSCLE SPASMS. (Patient taking differently: Take 10 mg by mouth 3 (three) times daily as needed for muscle spasms.) 30 tablet 0   No current facility-administered medications for this encounter.    REVIEW OF SYSTEMS:  On review of systems, the patient reports that he is doing well overall. He denies any chest pain, shortness of breath, cough, fevers, chills, night sweats, unintended weight changes. He denies any bowel disturbances, and denies abdominal pain, nausea or vomiting. He denies any new musculoskeletal or joint aches or pains. His IPSS was 2, indicating mild urinary symptoms with mild increased urgency and nocturia x1. 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 A complete review of systems is obtained and is otherwise negative.    PHYSICAL EXAM:  Wt Readings from Last 3 Encounters:  09/15/20 291 lb 6.4 oz (132.2 kg)  05/27/20 287 lb 0.6 oz (130.2 kg)  05/18/20 287 lb (130.2 kg)   Temp Readings from Last 3 Encounters:  09/15/20 97.8 F (36.6 C) (Temporal)  05/28/20 99 F (37.2 C)  05/18/20 98.4 F (36.9 C) (Oral)   BP Readings from Last 3 Encounters:  09/15/20 112/74  05/28/20 128/74  05/18/20 132/87   Pulse Readings from Last 3 Encounters:  09/15/20 71  05/28/20 80  05/18/20 65   Pain Assessment Pain Score: 0-No pain/10  In general this is a well appearing Caucasian male in no acute distress. He's alert and oriented x4 and appropriate throughout the examination. Cardiopulmonary assessment is negative for acute distress, and he exhibits normal effort.     KPS = 100  100 - Normal; no complaints; no evidence of disease. 90   - Able to carry on normal activity; minor signs or symptoms of disease. 80   - Normal activity with effort; some signs or symptoms of disease. 35   - Cares for self;  unable to carry on normal activity or to do active work. 60   - Requires occasional assistance, but is able to care for most of his personal needs. 50   - Requires considerable assistance and frequent medical care. 81   - Disabled; requires special care and assistance. 41   - Severely disabled; hospital admission is indicated although death not imminent. 52   - Very sick; hospital admission necessary; active supportive treatment necessary. 10   - Moribund; fatal processes progressing rapidly. 0     - Dead  Karnofsky DA, Abelmann Butler, Craver LS and Burchenal [facility] [phone]) The use of the nitrogen mustards in the palliative treatment of carcinoma: with particular reference to bronchogenic carcinoma Cancer 1 634-56  LABORATORY DATA:  Lab Results  Component Value Date   WBC 4.3 09/15/2020  HGB 14.6 09/15/2020   HCT 43.5 09/15/2020   MCV 91.1 09/15/2020   PLT 172.0 09/15/2020   Lab Results  Component Value Date   NA 139 09/15/2020   K 3.8 09/15/2020   CL 105 09/15/2020   CO2 28 09/15/2020   Lab Results  Component Value Date   ALT 13 09/15/2020   AST 16 09/15/2020   ALKPHOS 67 09/15/2020   BILITOT 0.6 09/15/2020     RADIOGRAPHY: No results found.    IMPRESSION/PLAN: This visit was conducted via MyChart to spare the patient unnecessary potential exposure in the healthcare setting during the current COVID-19 pandemic. 1. 50 y.o. gentleman with a detectable PSA of 0.084 s/p RALP 05/2020 for Stage pT3 pN0 Gleason 4+4 prostate cancer.  Today, we reviewed the findings and workup thus far.  We discussed the natural history of prostate cancer.  We reviewed the the implications of positive margins, extracapsular extension, and seminal vesicle involvement on the risk of prostate cancer recurrence, particularly in the setting of non-zero postoperative PSA. In his case, there was a positive margin, suspicious for extracapsular extension and his PSA remains low detectable. We reviewed some of the  evidence suggesting an advantage for patients who undergo adjuvant radiotherapy in the setting in terms of disease control and overall survival. We also discussed some of the dilemmas related to the available evidence.  We discussed the SWOG trial which did show an improvement in disease-free survival as well as overall survival using adjuvant radiotherapy. However, we discussed the fact that the study did not carefully control the usage of adjuvant radiotherapy in the observation arm. There is increasing evidence that careful surveillance with ultrasensitive PSA may provide an opportunity for early salvage in patients who undergo observation, which can lead to excellent results in terms of disease control and survival. We discussed radiation treatment directed to the prostatic fossa with regard to the logistics and delivery of external beam radiation treatment.  He was encouraged to ask questions that were answered to his stated satisfaction.  At the conclusion of our conversation, the patient is interested in having a repeat PSA in 3 months and if this shows any increase, he would like to move forward with the recommended 7.5 weeks of adjuvant external beam therapy to the prostate fossa at that time. We will share our discussion with Dr. Alinda Money. The patient appears to have a good understanding of his disease and our treatment recommendations which are of curative intent and is in agreement with the stated plan.  He knows that he is welcome to call at any time with any further questions or concerns regarding radiotherapy.  We enjoyed meeting with him today and look forward to continuing to participate in his care.  Given current concerns for patient exposure during the COVID-19 pandemic, this encounter was conducted via video-enabled WebEx visit. The patient has given verbal consent for this type of encounter. The attendants for this meeting include Tyler Pita MD, Ashlyn Bruning PA-C, and patient Stephen [name].  During the encounter, Tyler Pita MD and Freeman Caldron PA-C were located at [facility] Radiation Oncology Department.  Patient, Stephen [name] was located at home.   We personally spent 60 minutes in this encounter including chart review, reviewing radiological studies, meeting face-to-face with the patient, entering orders and completing documentation.    Stephen Johns, PA-C    Tyler Pita, MD  [facility] Direct Dial: [phone]  Fax: [phone] [email or web address]  Skype  [email or web address]   This document serves as a record of services personally performed by Tyler Pita, MD and Freeman Caldron, PA-C. It was created on their behalf by Wilburn Mylar, a trained medical scribe. The creation of this record is based on the scribe's personal observations and the provider's statements to them. This document has been checked and approved by the attending provider.

## 2020-09-17 NOTE — Progress Notes (Signed)
GU Location of Tumor / Histology:  Adenocarcinoma of the prostate  If Prostate Cancer, Gleason Score is (4 + 4), PSA (0.084 as of 08/27/20; 11.7 on 02/04/20 pre-treatment), and Prostate volume (39 g)  Stephen [name] presented with signs/symptoms of: elevated PSA levels   CT Pelvis w/ Contrast 04/14/2020   Biopsies revealed:  05/27/2020 FINAL MICROSCOPIC DIAGNOSIS:  A. PROSTATE, RADICAL PROSTATECTOMY:  - Prostatic acinar adenocarcinoma.       - Dominant nodule (left): Gleason score 4+4=8 with tertiary pattern 3, grade group 4.       - Additional nodule (right): Gleason score 3+3=6, grade group 1.  - Left posterior margin is positive (pattern 4), see comment.  - Perineural invasion present.  - See oncology table.  B. LYMPH NODES, RIGHT PELVIC, RESECTION:  - Two of two lymph nodes negative for carcinoma (0/2).  C. LYMPH NODES, LEFT PELVIC, RESECTION:  - Five of five lymph nodes negative for carcinoma (0/5).   03/15/2020   Past/Anticipated interventions by urology, if any:  09/08/2020 Dr. Raynelle Bring (office visit)  05/27/2020 Dr. Raynelle Bring Robotic assisted laparoscopic radical prostatectomy (bilateral nerve sparing) Bilateral robotic assisted laparoscopic pelvic lymphadenectomy  03/15/2020 Dr. Festus Aloe --Transrectal ultrasound of prostate with biopsies  Past/Anticipated interventions by medical oncology, if any:  No referral placed at this time  Weight changes, if any: no  IPSS Score: 2 SHIM Score:1  Bowel/Bladder complaints, if any: none   Nausea/Vomiting, if any: no  Pain issues, if any:  none  SAFETY ISSUES: Prior radiation? no Pacemaker/ICD? no Possible current pregnancy? N/A Is the patient on methotrexate? no  Current Complaints / other details:  Patient with positive Covid test today so we did a my chart video he is doing well and denies any issues or complaints of. Patient denies any pain or obstructive symptomes

## 2020-09-30 DIAGNOSIS — G4733 Obstructive sleep apnea (adult) (pediatric): Secondary | ICD-10-CM | POA: Diagnosis not present

## 2020-10-07 DIAGNOSIS — G4733 Obstructive sleep apnea (adult) (pediatric): Secondary | ICD-10-CM | POA: Diagnosis not present

## 2020-11-17 DIAGNOSIS — L814 Other melanin hyperpigmentation: Secondary | ICD-10-CM | POA: Diagnosis not present

## 2020-11-17 DIAGNOSIS — L821 Other seborrheic keratosis: Secondary | ICD-10-CM | POA: Diagnosis not present

## 2020-11-17 DIAGNOSIS — Z23 Encounter for immunization: Secondary | ICD-10-CM | POA: Diagnosis not present

## 2020-11-17 DIAGNOSIS — D225 Melanocytic nevi of trunk: Secondary | ICD-10-CM | POA: Diagnosis not present

## 2020-11-17 DIAGNOSIS — D1801 Hemangioma of skin and subcutaneous tissue: Secondary | ICD-10-CM | POA: Diagnosis not present

## 2020-11-17 DIAGNOSIS — L570 Actinic keratosis: Secondary | ICD-10-CM | POA: Diagnosis not present

## 2020-11-30 DIAGNOSIS — Z23 Encounter for immunization: Secondary | ICD-10-CM | POA: Diagnosis not present

## 2021-01-25 ENCOUNTER — Other Ambulatory Visit (HOSPITAL_COMMUNITY): Payer: Self-pay | Admitting: Urology

## 2021-01-25 DIAGNOSIS — C61 Malignant neoplasm of prostate: Secondary | ICD-10-CM

## 2021-01-26 NOTE — Progress Notes (Signed)
GU Location of Tumor / Histology: Adenocarcinoma of the prostate  If Prostate Cancer, Gleason Score is (4 + 4) and PSA is (0.34 on 01/18/21, 11.7 on 02/04/20 pre-treatment)   Biopsies:  05/27/2020 FINAL MICROSCOPIC DIAGNOSIS:  A. PROSTATE, RADICAL PROSTATECTOMY:  - Prostatic acinar adenocarcinoma.       - Dominant nodule (left): Gleason score 4+4=8 with tertiary pattern 3, grade group 4.       - Additional nodule (right): Gleason score 3+3=6, grade group 1.  - Left posterior margin is positive (pattern 4), see comment.  - Perineural invasion present.  - See oncology table.  B. LYMPH NODES, RIGHT PELVIC, RESECTION:  - Two of two lymph nodes negative for carcinoma (0/2).  C. LYMPH NODES, LEFT PELVIC, RESECTION:  - Five of five lymph nodes negative for carcinoma (0/5).    Dr. Junious Silk      Past/Anticipated interventions by urology, if any:    Weight changes, if any:  No  IPSS:  5 SHIM:  11  Bowel/Bladder complaints, if any:  No  Nausea/Vomiting, if any: No  Pain issues, if any:  0/10  SAFETY ISSUES: Prior radiation?  No Pacemaker/ICD?   No Possible current pregnancy?  Male Is the patient on methotrexate?  No  Current Complaints / other details:

## 2021-01-28 ENCOUNTER — Encounter (HOSPITAL_COMMUNITY): Payer: Managed Care, Other (non HMO)

## 2021-01-28 ENCOUNTER — Encounter (HOSPITAL_COMMUNITY): Payer: Self-pay

## 2021-01-30 NOTE — Progress Notes (Signed)
Radiation Oncology         (336) [phone] ________________________________  Name: Stephen [name] MRN: [id number]  Date: 02/02/2021  DOB: 1970-04-01  Follow-Up Visit Note  CC: Vivi Barrack, MD  Raynelle Bring, MD  Diagnosis:   51 y.o. gentleman with a rising detectable PSA of 0.34 s/p RALP 05/2020 for Stage pT3 pN0, Gleason 4+4 prostate cancer    ICD-10-CM   1. Prostate cancer [facility]) s/p prostatectomy 2022  C61      Narrative:  The patient returns today for follow-up.  We met in consultation on 9/2 for detectable PSA of 0.084.  At that time, we discussed salvage pelvic and prostatic fossa radiotherapy, but, the patient wanted to repeat PSA prior to making a decision.  His PSA was re-checked on 01/18/21 and increased to 0.34.  Dr. Alinda Money scheduled him for PSMA-PET on 1/49/22 which showed subtle eccentric left tracer uptake within the operative bed measures a S.U.V. max of 4.1.  In addition there was tracer uptake within bilateral inguinal nodes. Example on the right at up to 11 mm and a S.U.V. max of 4.0 on image 245/4. A left inguinal node measures 1.4 x 2.0 cm and a S.U.V. max of 3.9 on 246/4.  He returns today to discuss his PSA and PET results for consideration of radiotherapy.  ALLERGIES:  is allergic to penicillins and nsaids.  Meds: Current Outpatient Medications  Medication Sig Dispense Refill   cyclobenzaprine (FLEXERIL) 10 MG tablet TAKE 1 TABLET BY MOUTH AS NEEDED FOR MUSCLE SPASMS. (Patient taking differently: Take 10 mg by mouth 3 (three) times daily as needed for muscle spasms.) 30 tablet 0   No current facility-administered medications for this visit.    Physical Findings: The patient is in no acute distress. Patient is alert and oriented.  vitals were not taken for this visit. .  No significant changes.  Lab Findings: Lab Results  Component Value Date   WBC 4.3 09/15/2020   HGB 14.6 09/15/2020   HCT 43.5 09/15/2020   PLT 172.0 09/15/2020    Lab Results   Component Value Date   NA 139 09/15/2020   NA 140 12/21/2015   K 3.8 09/15/2020   CO2 28 09/15/2020   GLUCOSE 72 09/15/2020   BUN 18 09/15/2020   BUN 16 12/21/2015   CREATININE 1.24 09/15/2020   CREATININE 1.25 09/15/2019   BILITOT 0.6 09/15/2020   ALKPHOS 67 09/15/2020   AST 16 09/15/2020   ALT 13 09/15/2020   PROT 7.3 09/15/2020   ALBUMIN 4.0 09/15/2020   CALCIUM 9.5 09/15/2020   ANIONGAP 6 05/18/2020    Radiographic Findings: No results found.  Impression:  51 y.o. gentleman with a rising detectable PSA of 0.34 s/p RALP 05/2020 for Stage pT3 pN0, Gleason 4+4 prostate cancer.  He may benefit from salvage radiotherpay to the pelvis and prostate.  He had bilateral lymph node enlargement which is felt to represent reactive nodes per my personal review with two diagnostic radiologists and Dr. Alinda Money.  These will require ongoing observation.  Plan:  Today, I talked to the patient and family about the findings and work-up thus far.  We discussed the natural history of biochemically recurrent prostate cancer s/p prostatectomy and general treatment, highlighting the role of radiotherapy in the management.  We discussed the available radiation techniques, and focused on the details of logistics and delivery.  We reviewed the anticipated acute and late sequelae associated with radiation in this setting.  The patient was encouraged to ask  questions that I answered to the best of my ability.  The patient would like to proceed with radiation and will be scheduled for CT simulation.  I personally spent 35 minutes in this encounter including chart review, reviewing radiological studies, meeting face-to-face with the patient, entering orders and completing documentation.  _____________________________________  Sheral Apley Tammi Klippel, M.D.

## 2021-01-31 ENCOUNTER — Encounter (HOSPITAL_COMMUNITY)
Admission: RE | Admit: 2021-01-31 | Discharge: 2021-01-31 | Disposition: A | Discharge status: Home / Self Care | Payer: Commercial Managed Care - HMO | Source: Ambulatory Visit | Attending: Urology | Admitting: Urology

## 2021-01-31 ENCOUNTER — Other Ambulatory Visit: Payer: Self-pay

## 2021-01-31 DIAGNOSIS — C61 Malignant neoplasm of prostate: Secondary | ICD-10-CM | POA: Diagnosis not present

## 2021-01-31 MED ORDER — PIFLIFOLASTAT F 18 (PYLARIFY) INJECTION
9.0000 | Freq: Once | INTRAVENOUS | Status: AC
  Administered 2021-01-31: 9.47 via INTRAVENOUS

## 2021-02-02 ENCOUNTER — Other Ambulatory Visit: Payer: Self-pay

## 2021-02-02 ENCOUNTER — Ambulatory Visit
Admission: RE | Admit: 2021-02-02 | Discharge: 2021-02-02 | Disposition: A | Discharge status: Home / Self Care | Payer: Managed Care, Other (non HMO) | Source: Ambulatory Visit | Attending: Radiation Oncology | Admitting: Radiation Oncology

## 2021-02-02 VITALS — BP 124/79 | HR 71 | Temp 97.6°F | Resp 20 | Ht 73.0 in | Wt 295.6 lb

## 2021-02-02 DIAGNOSIS — C61 Malignant neoplasm of prostate: Secondary | ICD-10-CM | POA: Insufficient documentation

## 2021-02-04 NOTE — Progress Notes (Signed)
Called and introduced myself to the patient as the prostate nurse navigator.  No barriers to care identified at this time.  Patient had a consult with Dr. Tammi Klippel on 1/18 to discuss radiation options.  Provided patient with my direct contact number and encouraged him to call with any questions or concerns that may arise. Verbalized understanding.

## 2021-02-09 ENCOUNTER — Telehealth: Payer: Self-pay | Admitting: *Deleted

## 2021-02-09 NOTE — Telephone Encounter (Signed)
Called patient to remind of sim appt. for 02-10-21- arrival time- 7:45 am @ [facility], patient to arrive with full bladder and empty bowel, spoke with patient and he is aware of this appt.

## 2021-02-10 ENCOUNTER — Ambulatory Visit: Payer: Managed Care, Other (non HMO) | Admitting: Radiation Oncology

## 2021-02-10 ENCOUNTER — Other Ambulatory Visit: Payer: Self-pay

## 2021-02-10 ENCOUNTER — Ambulatory Visit
Admission: RE | Admit: 2021-02-10 | Discharge: 2021-02-10 | Disposition: A | Discharge status: Home / Self Care | Payer: Managed Care, Other (non HMO) | Source: Ambulatory Visit | Attending: Radiation Oncology | Admitting: Radiation Oncology

## 2021-02-10 DIAGNOSIS — C61 Malignant neoplasm of prostate: Secondary | ICD-10-CM | POA: Insufficient documentation

## 2021-02-11 ENCOUNTER — Ambulatory Visit: Payer: Managed Care, Other (non HMO) | Admitting: Radiation Oncology

## 2021-02-15 NOTE — Progress Notes (Signed)
°  Radiation Oncology         [phone] ________________________________  Name: Stephen [name] MRN: [id number]  Date: 02/10/2021  DOB: [birth year]  SIMULATION AND TREATMENT PLANNING NOTE    ICD-10-CM   1. Prostate cancer [facility]) s/p prostatectomy 2022  C61       DIAGNOSIS:  51 y.o. gentleman with a rising detectable PSA of 0.34 s/p RALP 05/2020 for Stage pT3 pN0, Gleason 4+4 prostate cancer  NARRATIVE:  The patient was brought to the [facility].  Identity was confirmed.  All relevant records and images related to the planned course of therapy were reviewed.  The patient freely provided informed written consent to proceed with treatment after reviewing the details related to the planned course of therapy. The consent form was witnessed and verified by the simulation staff.  Then, the patient was set-up in a stable reproducible supine position for radiation therapy.  A vacuum lock pillow device was custom fabricated to position his legs in a reproducible immobilized position.  Then, I performed a urethrogram under sterile conditions to identify the prostatic bed.  CT images were obtained.  Surface markings were placed.  The CT images were loaded into the planning software.  Then the prostate bed target, pelvic lymph node target and avoidance structures including the rectum, bladder, bowel and hips were contoured.  Treatment planning then occurred.  The radiation prescription was entered and confirmed.  A total of one complex treatment devices were fabricated. I have requested : Intensity Modulated Radiotherapy (IMRT) is medically necessary for this case for the following reason:  Rectal sparing.Marland Kitchen  PLAN:  The patient will receive 45 Gy in 25 fractions of 1.8 Gy, followed by a boost to the prostate bed to a total dose of 68.4 Gy with 13 additional fractions of 1.8 Gy.   ________________________________  Sheral Apley Tammi Klippel, M.D.

## 2021-02-17 ENCOUNTER — Ambulatory Visit: Payer: Commercial Managed Care - HMO | Admitting: Radiation Oncology

## 2021-02-21 ENCOUNTER — Ambulatory Visit: Payer: Commercial Managed Care - HMO

## 2021-02-22 ENCOUNTER — Ambulatory Visit: Payer: Commercial Managed Care - HMO

## 2021-02-22 DIAGNOSIS — Z510 Encounter for antineoplastic radiation therapy: Secondary | ICD-10-CM | POA: Insufficient documentation

## 2021-02-22 DIAGNOSIS — C61 Malignant neoplasm of prostate: Secondary | ICD-10-CM | POA: Insufficient documentation

## 2021-02-23 ENCOUNTER — Other Ambulatory Visit: Payer: Self-pay

## 2021-02-23 ENCOUNTER — Ambulatory Visit
Admission: RE | Admit: 2021-02-23 | Discharge: 2021-02-23 | Disposition: A | Discharge status: Home / Self Care | Payer: Commercial Managed Care - HMO | Source: Ambulatory Visit | Attending: Radiation Oncology | Admitting: Radiation Oncology

## 2021-02-23 DIAGNOSIS — Z510 Encounter for antineoplastic radiation therapy: Secondary | ICD-10-CM | POA: Diagnosis not present

## 2021-02-24 ENCOUNTER — Ambulatory Visit
Admission: RE | Admit: 2021-02-24 | Discharge: 2021-02-24 | Disposition: A | Discharge status: Home / Self Care | Payer: Commercial Managed Care - HMO | Source: Ambulatory Visit | Attending: Radiation Oncology | Admitting: Radiation Oncology

## 2021-02-24 DIAGNOSIS — Z510 Encounter for antineoplastic radiation therapy: Secondary | ICD-10-CM | POA: Diagnosis not present

## 2021-02-25 ENCOUNTER — Ambulatory Visit
Admission: RE | Admit: 2021-02-25 | Discharge: 2021-02-25 | Disposition: A | Discharge status: Home / Self Care | Payer: Commercial Managed Care - HMO | Source: Ambulatory Visit | Attending: Radiation Oncology | Admitting: Radiation Oncology

## 2021-02-25 ENCOUNTER — Other Ambulatory Visit: Payer: Self-pay

## 2021-02-25 DIAGNOSIS — Z510 Encounter for antineoplastic radiation therapy: Secondary | ICD-10-CM | POA: Diagnosis not present

## 2021-02-28 ENCOUNTER — Other Ambulatory Visit: Payer: Self-pay

## 2021-02-28 ENCOUNTER — Ambulatory Visit
Admission: RE | Admit: 2021-02-28 | Discharge: 2021-02-28 | Disposition: A | Discharge status: Home / Self Care | Payer: Commercial Managed Care - HMO | Source: Ambulatory Visit | Attending: Radiation Oncology | Admitting: Radiation Oncology

## 2021-02-28 DIAGNOSIS — Z510 Encounter for antineoplastic radiation therapy: Secondary | ICD-10-CM | POA: Diagnosis not present

## 2021-03-01 ENCOUNTER — Ambulatory Visit
Admission: RE | Admit: 2021-03-01 | Discharge: 2021-03-01 | Disposition: A | Discharge status: Home / Self Care | Payer: Commercial Managed Care - HMO | Source: Ambulatory Visit | Attending: Radiation Oncology | Admitting: Radiation Oncology

## 2021-03-01 DIAGNOSIS — Z510 Encounter for antineoplastic radiation therapy: Secondary | ICD-10-CM | POA: Diagnosis not present

## 2021-03-02 ENCOUNTER — Other Ambulatory Visit: Payer: Self-pay

## 2021-03-02 ENCOUNTER — Ambulatory Visit
Admission: RE | Admit: 2021-03-02 | Discharge: 2021-03-02 | Disposition: A | Discharge status: Home / Self Care | Payer: Commercial Managed Care - HMO | Source: Ambulatory Visit | Attending: Radiation Oncology | Admitting: Radiation Oncology

## 2021-03-02 DIAGNOSIS — Z510 Encounter for antineoplastic radiation therapy: Secondary | ICD-10-CM | POA: Diagnosis not present

## 2021-03-03 ENCOUNTER — Ambulatory Visit
Admission: RE | Admit: 2021-03-03 | Discharge: 2021-03-03 | Disposition: A | Discharge status: Home / Self Care | Payer: Commercial Managed Care - HMO | Source: Ambulatory Visit | Attending: Radiation Oncology | Admitting: Radiation Oncology

## 2021-03-03 DIAGNOSIS — Z510 Encounter for antineoplastic radiation therapy: Secondary | ICD-10-CM | POA: Diagnosis not present

## 2021-03-04 ENCOUNTER — Ambulatory Visit
Admission: RE | Admit: 2021-03-04 | Discharge: 2021-03-04 | Disposition: A | Discharge status: Home / Self Care | Payer: Commercial Managed Care - HMO | Source: Ambulatory Visit | Attending: Radiation Oncology | Admitting: Radiation Oncology

## 2021-03-04 DIAGNOSIS — Z510 Encounter for antineoplastic radiation therapy: Secondary | ICD-10-CM | POA: Diagnosis not present

## 2021-03-07 ENCOUNTER — Other Ambulatory Visit: Payer: Self-pay

## 2021-03-07 ENCOUNTER — Ambulatory Visit
Admission: RE | Admit: 2021-03-07 | Discharge: 2021-03-07 | Disposition: A | Discharge status: Home / Self Care | Payer: Commercial Managed Care - HMO | Source: Ambulatory Visit | Attending: Radiation Oncology | Admitting: Radiation Oncology

## 2021-03-07 DIAGNOSIS — Z510 Encounter for antineoplastic radiation therapy: Secondary | ICD-10-CM | POA: Diagnosis not present

## 2021-03-08 ENCOUNTER — Ambulatory Visit
Admission: RE | Admit: 2021-03-08 | Discharge: 2021-03-08 | Disposition: A | Discharge status: Home / Self Care | Payer: Commercial Managed Care - HMO | Source: Ambulatory Visit | Attending: Radiation Oncology | Admitting: Radiation Oncology

## 2021-03-08 DIAGNOSIS — Z510 Encounter for antineoplastic radiation therapy: Secondary | ICD-10-CM | POA: Diagnosis not present

## 2021-03-09 ENCOUNTER — Ambulatory Visit
Admission: RE | Admit: 2021-03-09 | Discharge: 2021-03-09 | Disposition: A | Discharge status: Home / Self Care | Payer: Commercial Managed Care - HMO | Source: Ambulatory Visit | Attending: Radiation Oncology | Admitting: Radiation Oncology

## 2021-03-09 ENCOUNTER — Other Ambulatory Visit: Payer: Self-pay

## 2021-03-09 DIAGNOSIS — Z510 Encounter for antineoplastic radiation therapy: Secondary | ICD-10-CM | POA: Diagnosis not present

## 2021-03-10 ENCOUNTER — Ambulatory Visit
Admission: RE | Admit: 2021-03-10 | Discharge: 2021-03-10 | Disposition: A | Discharge status: Home / Self Care | Payer: Commercial Managed Care - HMO | Source: Ambulatory Visit | Attending: Radiation Oncology | Admitting: Radiation Oncology

## 2021-03-10 DIAGNOSIS — Z510 Encounter for antineoplastic radiation therapy: Secondary | ICD-10-CM | POA: Diagnosis not present

## 2021-03-11 ENCOUNTER — Other Ambulatory Visit: Payer: Self-pay

## 2021-03-11 ENCOUNTER — Ambulatory Visit
Admission: RE | Admit: 2021-03-11 | Discharge: 2021-03-11 | Disposition: A | Discharge status: Home / Self Care | Payer: Commercial Managed Care - HMO | Source: Ambulatory Visit | Attending: Radiation Oncology | Admitting: Radiation Oncology

## 2021-03-11 DIAGNOSIS — Z510 Encounter for antineoplastic radiation therapy: Secondary | ICD-10-CM | POA: Diagnosis not present

## 2021-03-14 ENCOUNTER — Ambulatory Visit
Admission: RE | Admit: 2021-03-14 | Discharge: 2021-03-14 | Disposition: A | Discharge status: Home / Self Care | Payer: Commercial Managed Care - HMO | Source: Ambulatory Visit | Attending: Radiation Oncology | Admitting: Radiation Oncology

## 2021-03-14 ENCOUNTER — Other Ambulatory Visit: Payer: Self-pay

## 2021-03-14 DIAGNOSIS — Z510 Encounter for antineoplastic radiation therapy: Secondary | ICD-10-CM | POA: Diagnosis not present

## 2021-03-15 ENCOUNTER — Ambulatory Visit
Admission: RE | Admit: 2021-03-15 | Discharge: 2021-03-15 | Disposition: A | Discharge status: Home / Self Care | Payer: Commercial Managed Care - HMO | Source: Ambulatory Visit | Attending: Radiation Oncology | Admitting: Radiation Oncology

## 2021-03-15 DIAGNOSIS — Z510 Encounter for antineoplastic radiation therapy: Secondary | ICD-10-CM | POA: Diagnosis not present

## 2021-03-16 ENCOUNTER — Other Ambulatory Visit: Payer: Self-pay

## 2021-03-16 ENCOUNTER — Ambulatory Visit
Admission: RE | Admit: 2021-03-16 | Discharge: 2021-03-16 | Disposition: A | Discharge status: Home / Self Care | Payer: Commercial Managed Care - HMO | Source: Ambulatory Visit | Attending: Radiation Oncology | Admitting: Radiation Oncology

## 2021-03-16 DIAGNOSIS — C61 Malignant neoplasm of prostate: Secondary | ICD-10-CM | POA: Insufficient documentation

## 2021-03-16 DIAGNOSIS — Z510 Encounter for antineoplastic radiation therapy: Secondary | ICD-10-CM | POA: Diagnosis not present

## 2021-03-17 ENCOUNTER — Ambulatory Visit
Admission: RE | Admit: 2021-03-17 | Discharge: 2021-03-17 | Disposition: A | Discharge status: Home / Self Care | Payer: Commercial Managed Care - HMO | Source: Ambulatory Visit | Attending: Radiation Oncology | Admitting: Radiation Oncology

## 2021-03-17 DIAGNOSIS — Z510 Encounter for antineoplastic radiation therapy: Secondary | ICD-10-CM | POA: Diagnosis not present

## 2021-03-18 ENCOUNTER — Other Ambulatory Visit: Payer: Self-pay

## 2021-03-18 ENCOUNTER — Ambulatory Visit
Admission: RE | Admit: 2021-03-18 | Discharge: 2021-03-18 | Disposition: A | Discharge status: Home / Self Care | Payer: Commercial Managed Care - HMO | Source: Ambulatory Visit | Attending: Radiation Oncology | Admitting: Radiation Oncology

## 2021-03-18 DIAGNOSIS — Z510 Encounter for antineoplastic radiation therapy: Secondary | ICD-10-CM | POA: Diagnosis not present

## 2021-03-21 ENCOUNTER — Ambulatory Visit
Admission: RE | Admit: 2021-03-21 | Discharge: 2021-03-21 | Disposition: A | Discharge status: Home / Self Care | Payer: Commercial Managed Care - HMO | Source: Ambulatory Visit | Attending: Radiation Oncology | Admitting: Radiation Oncology

## 2021-03-21 ENCOUNTER — Other Ambulatory Visit: Payer: Self-pay

## 2021-03-21 DIAGNOSIS — Z510 Encounter for antineoplastic radiation therapy: Secondary | ICD-10-CM | POA: Diagnosis not present

## 2021-03-22 ENCOUNTER — Ambulatory Visit
Admission: RE | Admit: 2021-03-22 | Discharge: 2021-03-22 | Disposition: A | Discharge status: Home / Self Care | Payer: Commercial Managed Care - HMO | Source: Ambulatory Visit | Attending: Radiation Oncology | Admitting: Radiation Oncology

## 2021-03-22 DIAGNOSIS — Z510 Encounter for antineoplastic radiation therapy: Secondary | ICD-10-CM | POA: Diagnosis not present

## 2021-03-23 ENCOUNTER — Ambulatory Visit
Admission: RE | Admit: 2021-03-23 | Discharge: 2021-03-23 | Disposition: A | Discharge status: Home / Self Care | Payer: Commercial Managed Care - HMO | Source: Ambulatory Visit | Attending: Radiation Oncology | Admitting: Radiation Oncology

## 2021-03-23 ENCOUNTER — Other Ambulatory Visit: Payer: Self-pay

## 2021-03-23 DIAGNOSIS — Z510 Encounter for antineoplastic radiation therapy: Secondary | ICD-10-CM | POA: Diagnosis not present

## 2021-03-24 ENCOUNTER — Ambulatory Visit
Admission: RE | Admit: 2021-03-24 | Discharge: 2021-03-24 | Disposition: A | Discharge status: Home / Self Care | Payer: Commercial Managed Care - HMO | Source: Ambulatory Visit | Attending: Radiation Oncology | Admitting: Radiation Oncology

## 2021-03-24 DIAGNOSIS — Z510 Encounter for antineoplastic radiation therapy: Secondary | ICD-10-CM | POA: Diagnosis not present

## 2021-03-25 ENCOUNTER — Ambulatory Visit
Admission: RE | Admit: 2021-03-25 | Discharge: 2021-03-25 | Disposition: A | Discharge status: Home / Self Care | Payer: Commercial Managed Care - HMO | Source: Ambulatory Visit | Attending: Radiation Oncology | Admitting: Radiation Oncology

## 2021-03-25 ENCOUNTER — Other Ambulatory Visit: Payer: Self-pay

## 2021-03-25 DIAGNOSIS — Z510 Encounter for antineoplastic radiation therapy: Secondary | ICD-10-CM | POA: Diagnosis not present

## 2021-03-28 ENCOUNTER — Other Ambulatory Visit: Payer: Self-pay

## 2021-03-28 ENCOUNTER — Ambulatory Visit: Payer: Commercial Managed Care - HMO

## 2021-03-28 DIAGNOSIS — Z510 Encounter for antineoplastic radiation therapy: Secondary | ICD-10-CM | POA: Diagnosis not present

## 2021-03-29 ENCOUNTER — Ambulatory Visit
Admission: RE | Admit: 2021-03-29 | Discharge: 2021-03-29 | Disposition: A | Discharge status: Home / Self Care | Payer: Commercial Managed Care - HMO | Source: Ambulatory Visit | Attending: Radiation Oncology | Admitting: Radiation Oncology

## 2021-03-29 DIAGNOSIS — Z510 Encounter for antineoplastic radiation therapy: Secondary | ICD-10-CM | POA: Diagnosis not present

## 2021-03-30 ENCOUNTER — Ambulatory Visit
Admission: RE | Admit: 2021-03-30 | Discharge: 2021-03-30 | Disposition: A | Discharge status: Home / Self Care | Payer: Commercial Managed Care - HMO | Source: Ambulatory Visit | Attending: Radiation Oncology | Admitting: Radiation Oncology

## 2021-03-30 ENCOUNTER — Ambulatory Visit: Payer: Commercial Managed Care - HMO

## 2021-03-30 DIAGNOSIS — Z510 Encounter for antineoplastic radiation therapy: Secondary | ICD-10-CM | POA: Diagnosis not present

## 2021-03-31 ENCOUNTER — Other Ambulatory Visit: Payer: Self-pay

## 2021-03-31 ENCOUNTER — Ambulatory Visit
Admission: RE | Admit: 2021-03-31 | Discharge: 2021-03-31 | Disposition: A | Discharge status: Home / Self Care | Payer: Commercial Managed Care - HMO | Source: Ambulatory Visit | Attending: Radiation Oncology | Admitting: Radiation Oncology

## 2021-03-31 DIAGNOSIS — Z510 Encounter for antineoplastic radiation therapy: Secondary | ICD-10-CM | POA: Diagnosis not present

## 2021-04-01 ENCOUNTER — Ambulatory Visit
Admission: RE | Admit: 2021-04-01 | Discharge: 2021-04-01 | Disposition: A | Discharge status: Home / Self Care | Payer: Commercial Managed Care - HMO | Source: Ambulatory Visit | Attending: Radiation Oncology | Admitting: Radiation Oncology

## 2021-04-01 DIAGNOSIS — Z510 Encounter for antineoplastic radiation therapy: Secondary | ICD-10-CM | POA: Diagnosis not present

## 2021-04-04 ENCOUNTER — Ambulatory Visit
Admission: RE | Admit: 2021-04-04 | Discharge: 2021-04-04 | Disposition: A | Discharge status: Home / Self Care | Payer: Commercial Managed Care - HMO | Source: Ambulatory Visit | Attending: Radiation Oncology | Admitting: Radiation Oncology

## 2021-04-04 ENCOUNTER — Other Ambulatory Visit: Payer: Self-pay

## 2021-04-04 DIAGNOSIS — Z510 Encounter for antineoplastic radiation therapy: Secondary | ICD-10-CM | POA: Diagnosis not present

## 2021-04-05 ENCOUNTER — Ambulatory Visit
Admission: RE | Admit: 2021-04-05 | Discharge: 2021-04-05 | Disposition: A | Discharge status: Home / Self Care | Payer: Commercial Managed Care - HMO | Source: Ambulatory Visit | Attending: Radiation Oncology | Admitting: Radiation Oncology

## 2021-04-05 DIAGNOSIS — Z510 Encounter for antineoplastic radiation therapy: Secondary | ICD-10-CM | POA: Diagnosis not present

## 2021-04-06 ENCOUNTER — Other Ambulatory Visit: Payer: Self-pay

## 2021-04-06 ENCOUNTER — Ambulatory Visit
Admission: RE | Admit: 2021-04-06 | Discharge: 2021-04-06 | Disposition: A | Discharge status: Home / Self Care | Payer: Commercial Managed Care - HMO | Source: Ambulatory Visit | Attending: Radiation Oncology | Admitting: Radiation Oncology

## 2021-04-06 DIAGNOSIS — Z510 Encounter for antineoplastic radiation therapy: Secondary | ICD-10-CM | POA: Diagnosis not present

## 2021-04-07 ENCOUNTER — Ambulatory Visit
Admission: RE | Admit: 2021-04-07 | Discharge: 2021-04-07 | Disposition: A | Discharge status: Home / Self Care | Payer: Commercial Managed Care - HMO | Source: Ambulatory Visit | Attending: Radiation Oncology | Admitting: Radiation Oncology

## 2021-04-07 DIAGNOSIS — Z510 Encounter for antineoplastic radiation therapy: Secondary | ICD-10-CM | POA: Diagnosis not present

## 2021-04-08 ENCOUNTER — Ambulatory Visit
Admission: RE | Admit: 2021-04-08 | Discharge: 2021-04-08 | Disposition: A | Discharge status: Home / Self Care | Payer: Commercial Managed Care - HMO | Source: Ambulatory Visit | Attending: Radiation Oncology | Admitting: Radiation Oncology

## 2021-04-08 DIAGNOSIS — Z510 Encounter for antineoplastic radiation therapy: Secondary | ICD-10-CM | POA: Diagnosis not present

## 2021-04-11 ENCOUNTER — Ambulatory Visit
Admission: RE | Admit: 2021-04-11 | Discharge: 2021-04-11 | Disposition: A | Discharge status: Home / Self Care | Payer: Commercial Managed Care - HMO | Source: Ambulatory Visit | Attending: Radiation Oncology | Admitting: Radiation Oncology

## 2021-04-11 DIAGNOSIS — Z510 Encounter for antineoplastic radiation therapy: Secondary | ICD-10-CM | POA: Diagnosis not present

## 2021-04-12 ENCOUNTER — Ambulatory Visit
Admission: RE | Admit: 2021-04-12 | Discharge: 2021-04-12 | Disposition: A | Discharge status: Home / Self Care | Payer: Commercial Managed Care - HMO | Source: Ambulatory Visit | Attending: Radiation Oncology | Admitting: Radiation Oncology

## 2021-04-12 ENCOUNTER — Other Ambulatory Visit: Payer: Self-pay

## 2021-04-12 DIAGNOSIS — Z510 Encounter for antineoplastic radiation therapy: Secondary | ICD-10-CM | POA: Diagnosis not present

## 2021-04-13 ENCOUNTER — Ambulatory Visit
Admission: RE | Admit: 2021-04-13 | Discharge: 2021-04-13 | Disposition: A | Discharge status: Home / Self Care | Payer: Commercial Managed Care - HMO | Source: Ambulatory Visit | Attending: Radiation Oncology | Admitting: Radiation Oncology

## 2021-04-13 ENCOUNTER — Ambulatory Visit: Payer: Commercial Managed Care - HMO

## 2021-04-13 DIAGNOSIS — Z510 Encounter for antineoplastic radiation therapy: Secondary | ICD-10-CM | POA: Diagnosis not present

## 2021-04-14 ENCOUNTER — Ambulatory Visit
Admission: RE | Admit: 2021-04-14 | Discharge: 2021-04-14 | Disposition: A | Discharge status: Home / Self Care | Payer: Commercial Managed Care - HMO | Source: Ambulatory Visit | Attending: Radiation Oncology | Admitting: Radiation Oncology

## 2021-04-14 ENCOUNTER — Other Ambulatory Visit: Payer: Self-pay

## 2021-04-14 DIAGNOSIS — Z510 Encounter for antineoplastic radiation therapy: Secondary | ICD-10-CM | POA: Diagnosis not present

## 2021-04-15 ENCOUNTER — Encounter: Payer: Self-pay | Admitting: Urology

## 2021-04-15 ENCOUNTER — Telehealth: Payer: Self-pay | Admitting: *Deleted

## 2021-04-15 ENCOUNTER — Ambulatory Visit
Admission: RE | Admit: 2021-04-15 | Discharge: 2021-04-15 | Disposition: A | Discharge status: Home / Self Care | Payer: Commercial Managed Care - HMO | Source: Ambulatory Visit | Attending: Radiation Oncology | Admitting: Radiation Oncology

## 2021-04-15 DIAGNOSIS — Z510 Encounter for antineoplastic radiation therapy: Secondary | ICD-10-CM | POA: Diagnosis not present

## 2021-04-15 DIAGNOSIS — C61 Malignant neoplasm of prostate: Secondary | ICD-10-CM

## 2021-04-15 NOTE — Telephone Encounter (Signed)
CALLED PATIENT TO ASK ABOUT DOING HIS TELEPHONE FU ON 05-25-21 @ 8:30 AM , LVM FOR A RETURN CALL ?

## 2021-05-18 ENCOUNTER — Ambulatory Visit: Payer: Self-pay | Admitting: Urology

## 2021-05-24 ENCOUNTER — Encounter: Payer: Self-pay | Admitting: Urology

## 2021-05-24 NOTE — Progress Notes (Signed)
Telephone appointment. I verified patient's identity and began nursing interview. Patient is doing well. No issues reported at this time. ? ?Meaningful use complete. ?I-PSS score of 2 (mild). ?No urinary management medications. ?Urology appointment- August, 2023- per patient. ? ?Reminded patient of their 8:30am-05/25/21 telephone appointment w/ Ashlyn Bruning PA-C. I left my extension [phone] in case patient need anything. Patient verbalized understanding of information. ? ?Patient contact [phone] ?

## 2021-05-25 ENCOUNTER — Ambulatory Visit
Admission: RE | Admit: 2021-05-25 | Discharge: 2021-05-25 | Disposition: A | Discharge status: Home / Self Care | Payer: Commercial Managed Care - HMO | Source: Ambulatory Visit | Attending: Urology | Admitting: Urology

## 2021-05-25 DIAGNOSIS — C61 Malignant neoplasm of prostate: Secondary | ICD-10-CM

## 2021-05-25 NOTE — Progress Notes (Signed)
?Radiation Oncology         (336) [phone] ?________________________________ ? ?Name: Stephen [name] MRN: [id number]  ?Date: 05/25/2021  DOB: 1970/02/04 ? ?Post Treatment Note ? ?CC: Vivi Barrack, MD  Raynelle Bring, MD ? ?Diagnosis:   51 y.o. gentleman with a rising detectable PSA of 0.34 s/p RALP 05/2020 for Stage pT3 pN0, Gleason 4+4 prostate cancer ? ?Interval Since Last Radiation:  5.5 weeks  ?02/23/21 - 04/15/21: ?1. The prostate fossa and pelvic lymph nodes were initially treated to 45 Gy in 25 fractions of 1.8 Gy  ?2. The prostate fossa only was boosted to 68.4 Gy with 13 additional fractions of 1.8 Gy  ? ?Narrative:  I spoke with the patient to conduct his routine scheduled 1 month follow up visit via telephone to spare the patient unnecessary potential exposure in the healthcare setting during the current COVID-19 pandemic.  The patient was notified in advance and gave permission to proceed with this visit format. ? ?He tolerated radiation treatment relatively well with only minor urinary irritation and modest fatigue.  He did experience some increased nocturia, urgency and mild dysuria which was improved with AZO as needed.  He also reported occasional, mild diarrhea but denied abdominal pain, nausea or vomiting.                             ? ?On review of systems, the patient states that he is doing very well in general.  His LUTS have pretty much resolved and he feels like he is back to his baseline at this point.  He specifically denies dysuria, gross hematuria, straining to void, incomplete bladder emptying or incontinence.  He reports a healthy appetite and is maintaining his weight.  He denies abdominal pain, nausea, vomiting, diarrhea or constipation.  He has not noticed any significant change in his energy level and overall, is quite pleased with his progress to date. ? ?ALLERGIES:  is allergic to penicillins and nsaids. ? ?Meds: ?Current Outpatient Medications  ?Medication Sig Dispense Refill  ?  cyclobenzaprine (FLEXERIL) 10 MG tablet TAKE 1 TABLET BY MOUTH AS NEEDED FOR MUSCLE SPASMS. (Patient taking differently: Take 10 mg by mouth 3 (three) times daily as needed for muscle spasms.) 30 tablet 0  ? ?No current facility-administered medications for this encounter.  ? ? ?Physical Findings: ? vitals were not taken for this visit.  ?Pain Assessment ?Pain Score: 0-No pain/10 ?Unable to assess due to telephone follow-up visit format.  ? ?Lab Findings: ?Lab Results  ?Component Value Date  ? WBC 4.3 09/15/2020  ? HGB 14.6 09/15/2020  ? HCT 43.5 09/15/2020  ? MCV 91.1 09/15/2020  ? PLT 172.0 09/15/2020  ? ? ? ?Radiographic Findings: ?No results found. ? ?Impression/Plan: ?1. 51 y.o. gentleman with a rising detectable PSA of 0.34 s/p RALP 05/2020 for Stage pT3 pN0, Gleason 4+4 prostate cancer. ?He will continue to follow up with urology for ongoing PSA determinations and has an appointment scheduled with Dr. Alinda Money in August 2023. He understands what to expect with regards to PSA monitoring going forward. I will look forward to following his response to treatment via correspondence with urology, and would be happy to continue to participate in his care if clinically indicated. I talked to the patient about what to expect in the future, and answered his questions to his stated satisfaction. I encouraged him to call or return to the office if he has any questions regarding his previous  radiation or possible radiation side effects. He was comfortable with this plan and will follow up as needed.  ? ? ? ?Nicholos Johns, PA-C  ?

## 2021-05-25 NOTE — Progress Notes (Signed)
?  Radiation Oncology         (336) [phone] ?________________________________ ? ?Name: Stephen [name] MRN: [id number]  ?Date: 04/15/2021  DOB: Jan 27, 1970 ? ?End of Treatment Note ? ?Diagnosis:   51 y.o. gentleman with a rising detectable PSA of 0.34 s/p RALP 05/2020 for Stage pT3 pN0, Gleason 4+4 prostate cancer    ? ?Indication for treatment:  Curative, Definitive Radiotherapy      ? ?Radiation treatment dates:   02/23/21 - 04/15/21 ? ?Site/dose:  ?1. The prostate fossa and pelvic lymph nodes were initially treated to 45 Gy in 25 fractions of 1.8 Gy  ?2. The prostate fossa only was boosted to 68.4 Gy with 13 additional fractions of 1.8 Gy  ? ?Beams/energy:  ?1. The prostate fossa  and pelvic lymph nodes were initially treated using VMAT intensity modulated radiotherapy delivering 6 megavolt photons. Image guidance was performed with CB-CT studies prior to each fraction. He was immobilized with a body fix lower extremity mold.  ?2. The prostate fossa only was boosted using VMAT intensity modulated radiotherapy delivering 6 megavolt photons. Image guidance was performed with CB-CT studies prior to each fraction. He was immobilized with a body fix lower extremity mold. ? ?Narrative: The patient tolerated radiation treatment relatively well with only minor urinary irritation and modest fatigue.  He did experience some increased nocturia, urgency and mild dysuria which was improved with AZO as needed.  He also reported occasional, mild diarrhea but denied abdominal pain, nausea or vomiting. ? ?Plan: The patient has completed radiation treatment. He will return to radiation oncology clinic for routine followup in one month. I advised him to call or return sooner if he has any questions or concerns related to his recovery or treatment. ?________________________________ ? ?Sheral Apley Tammi Klippel, M.D. ?  ?

## 2021-06-23 ENCOUNTER — Telehealth: Payer: Self-pay | Admitting: *Deleted

## 2021-07-14 ENCOUNTER — Inpatient Hospital Stay: Payer: Commercial Managed Care - HMO | Attending: Radiation Oncology | Admitting: *Deleted

## 2021-07-14 ENCOUNTER — Encounter: Payer: Self-pay | Admitting: *Deleted

## 2021-07-14 ENCOUNTER — Other Ambulatory Visit: Payer: Self-pay

## 2021-07-14 VITALS — BP 119/83 | HR 72 | Temp 97.4°F | Resp 18 | Ht 73.0 in | Wt 296.7 lb

## 2021-07-14 DIAGNOSIS — C61 Malignant neoplasm of prostate: Secondary | ICD-10-CM

## 2021-07-14 DIAGNOSIS — Z85828 Personal history of other malignant neoplasm of skin: Secondary | ICD-10-CM | POA: Insufficient documentation

## 2021-07-14 NOTE — Progress Notes (Signed)
   Pt is here today for SCP visit. Vital signs are WNL. Pt denies pain and fatigue. Allergies and medication s reviewed and completed. Pt states he is sleeping well and only going to bathroom at night once or twice. He says he does have some episodes of urgency mainly when he is out but is able to get to bathroom in time.  Sine the surgery he mentioned he has left lower extremity swelling and does use compression socks and compression wraps at home. Pt says he does have a history of edema to both lower extremities but feels like the LLE has worsened since  the prostatectomy  I will call Dr. Lynne Logan nurse to make them aware. He will see Dr. Alinda Money in August for PSA. Pt denies smoking and drinks socially. 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 I discussed the importance of exercises and provided him with the schedule of online classes. We discussed diet and nutrition and reviewed the "Nutrition [employer]". Pt will see PCP on Sept.1, 2023 for annual check-up. Last colonoscopy was 11/21/2019. Pt does see a dermatologist yearly for skin- check-up. Overall, pt says he feels like he is doing good.

## 2021-09-16 ENCOUNTER — Ambulatory Visit (INDEPENDENT_AMBULATORY_CARE_PROVIDER_SITE_OTHER): Payer: Commercial Managed Care - HMO | Admitting: Family Medicine

## 2021-09-16 ENCOUNTER — Encounter: Payer: Self-pay | Admitting: Family Medicine

## 2021-09-16 VITALS — BP 112/76 | HR 62 | Temp 97.0°F | Ht 73.0 in | Wt 288.4 lb

## 2021-09-16 DIAGNOSIS — R600 Localized edema: Secondary | ICD-10-CM | POA: Diagnosis not present

## 2021-09-16 DIAGNOSIS — Z23 Encounter for immunization: Secondary | ICD-10-CM

## 2021-09-16 DIAGNOSIS — R7303 Prediabetes: Secondary | ICD-10-CM

## 2021-09-16 DIAGNOSIS — C61 Malignant neoplasm of prostate: Secondary | ICD-10-CM

## 2021-09-16 DIAGNOSIS — Z0001 Encounter for general adult medical examination with abnormal findings: Secondary | ICD-10-CM

## 2021-09-16 DIAGNOSIS — R222 Localized swelling, mass and lump, trunk: Secondary | ICD-10-CM

## 2021-09-16 DIAGNOSIS — Z1159 Encounter for screening for other viral diseases: Secondary | ICD-10-CM

## 2021-09-16 DIAGNOSIS — Z13220 Encounter for screening for lipoid disorders: Secondary | ICD-10-CM

## 2021-09-16 NOTE — Progress Notes (Signed)
Chief Complaint:  Stephen [name] is a 51 y.o. male who presents today for his annual comprehensive physical exam.    Assessment/Plan:  New/Acute Problems: Abdominal wall lump Not appreciable on exam today.  May be a lipoma.  Will check ultrasound to confirm  Concern for cardiac calcification Incidentally found on recent PET scan.  We will check cardiac CT scan to further evaluate.  May need referral to cardiology depending on results.  Chronic Problems Addressed Today: Prostate cancer [facility]) s/p prostatectomy 2022 Follows with urology and oncology.  Recently had radiation therapy.  Prediabetes Check A1c.  Bilateral lower extremity edema Symptoms are stable without medications.  We recommended compression stockings and leg elevation.  Preventative Healthcare: Flu vaccine given today.  We will check labs  Patient Counseling(The following topics were reviewed and/or handout was given):  -Nutrition: Stressed importance of moderation in sodium/caffeine intake, saturated fat and cholesterol, caloric balance, sufficient intake of fresh fruits, vegetables, and fiber.  -Stressed the importance of regular exercise.   -Substance Abuse: Discussed cessation/primary prevention of tobacco, alcohol, or other drug use; driving or other dangerous activities under the influence; availability of treatment for abuse.   -Injury prevention: Discussed safety belts, safety helmets, smoke detector, smoking near bedding or upholstery.   -Sexuality: Discussed sexually transmitted diseases, partner selection, use of condoms, avoidance of unintended pregnancy and contraceptive alternatives.   -Dental health: Discussed importance of regular tooth brushing, flossing, and dental visits.  -Health maintenance and immunizations reviewed. Please refer to Health maintenance section.  Return to care in 1 year for next preventative visit.     Subjective:  HPI:  He has no acute complaints today.   See A/P for  status of chronic conditions.  Since her last visit he has had to undergo radiation therapy for his prostate cancer.  He has been following with oncology and urology for this.  He is also noted to have a lump in his left abdominal wall.  This seems to come and go.  No pain.  No nausea or vomiting.  He is also concerned about the incidental finding of coronary artery calcification on his recent PET scan.  Lifestyle Diet: Limiting junk food.  Exercise: Tries to stay busy.      09/16/2021    8:44 AM  Depression screen PHQ 2/9  Decreased Interest 0  Down, Depressed, Hopeless 0  PHQ - 2 Score 0    Health Maintenance Due  Topic Date Due   Hepatitis C Screening  Never done     ROS: Per HPI, otherwise a complete review of systems was negative.   PMH:  The following were reviewed and entered/updated in epic: Past Medical History:  Diagnosis Date   Basal cell carcinoma    multiple skin cancers removed, prostate cancer    Degenerative disc disease, lumbar    Left shoulder pain    frozen shoulder per tp    Prostate CA ([place])    Ruptured cervical disc    Sleep apnea    no cpap   Patient Active Problem List   Diagnosis Date Noted   History of malignant neoplasm of skin 07/14/2021   Adhesive capsulitis of left shoulder 04/28/2020   Adverse reaction to NSAIDs 12/16/2019   Prostate cancer [facility]) s/p prostatectomy 2022 10/21/2019   Tinnitus of both ears 09/15/2019   Recurrent low back pain 09/13/2018   Stasis dermatitis 09/13/2018   Prediabetes 02/03/2017   Bilateral lower extremity edema 02/02/2017   OSA (obstructive sleep apnea) 02/02/2017  Past Surgical History:  Procedure Laterality Date   APPENDECTOMY     EYE MUSCLE SURGERY     2010/2011--lazy   LYMPHADENECTOMY Bilateral 05/27/2020   Procedure: LYMPHADENECTOMY, PELVIC;  Surgeon: Raynelle Bring, MD;  Location: [facility];  Service: Urology;  Laterality: Bilateral;   ROBOT ASSISTED LAPAROSCOPIC RADICAL PROSTATECTOMY N/A  05/27/2020   Procedure: XI ROBOTIC ASSISTED LAPAROSCOPIC RADICAL PROSTATECTOMY LEVEL 2;  Surgeon: Raynelle Bring, MD;  Location: [facility];  Service: Urology;  Laterality: N/A;   WISDOM TOOTH EXTRACTION      Family History  Problem Relation Age of Onset   Hypertension Mother    Ovarian cancer Maternal Grandmother 33   Esophageal cancer Maternal Grandfather 52   Colon cancer Neg Hx    Colon polyps Neg Hx    Rectal cancer Neg Hx    Stomach cancer Neg Hx     Medications- reviewed and updated Current Outpatient Medications  Medication Sig Dispense Refill   cyclobenzaprine (FLEXERIL) 10 MG tablet TAKE 1 TABLET BY MOUTH AS NEEDED FOR MUSCLE SPASMS. (Patient not taking: Reported on 07/14/2021) 30 tablet 0   No current facility-administered medications for this visit.    Allergies-reviewed and updated Allergies  Allergen Reactions   Penicillins Nausea Only    Unknown - Childhood allergy   Nsaids     Pt told to avoid     Social History   Socioeconomic History   Marital status: Married    Spouse name: Not on file   Number of children: Not on file   Years of education: Not on file   Highest education level: Not on file  Occupational History   Not on file  Tobacco Use   Smoking status: Never   Smokeless tobacco: Never  Vaping Use   Vaping Use: Never used  Substance and Sexual Activity   Alcohol use: Yes    Alcohol/week: 4.0 standard drinks of alcohol    Types: 4 Standard drinks or equivalent per week    Comment: Socially   Drug use: No   Sexual activity: Yes    Partners: Female  Other Topics Concern   Not on file  Social History Narrative   Not on file   Social Determinants of Health   Financial Resource Strain: Not on file  Food Insecurity: Not on file  Transportation Needs: Not on file  Physical Activity: Not on file  Stress: Not on file  Social Connections: Not on file        Objective:  Physical Exam: BP 112/76   Pulse 62   Temp (!) 97 F (36.1 C)  (Temporal)   Ht '6\' 1"'$  (1.854 m)   Wt 288 lb 6.4 oz (130.8 kg)   SpO2 100%   BMI 38.05 kg/m   Body mass index is 38.05 kg/m. Wt Readings from Last 3 Encounters:  09/16/21 288 lb 6.4 oz (130.8 kg)  07/14/21 296 lb 11.2 oz (134.6 kg)  02/02/21 295 lb 9.6 oz (134.1 kg)   Gen: NAD, resting comfortably HEENT: TMs normal bilaterally. OP clear. No thyromegaly noted.  CV: RRR with no murmurs appreciated Pulm: NWOB, CTAB with no crackles, wheezes, or rhonchi GI: Normal bowel sounds present. Soft, Nontender, Nondistended. MSK: no edema, cyanosis, or clubbing noted Skin: warm, dry Neuro: CN2-12 grossly intact. Strength 5/5 in upper and lower extremities. Reflexes symmetric and intact bilaterally.  Psych: Normal affect and thought content     Clif Serio M. Jerline Pain, MD 09/16/2021 9:40 AM

## 2021-09-16 NOTE — Assessment & Plan Note (Signed)
Symptoms are stable without medications.  We recommended compression stockings and leg elevation.

## 2021-09-16 NOTE — Patient Instructions (Signed)
It was very nice to see you today!  We will check blood work today.  I think you probably have a lipoma though we will check an ultrasound to confirm this.  We will get a cardiac CT scan to look at the calcifications in your heart.  No medication changes today.  Please continue to work on diet and exercise.  We gave your flu shot today.  I will see back next year for your annual physical.  Come back to see me sooner if needed.  Take care, Dr Jerline Pain  PLEASE NOTE:  If you had any lab tests please let us know if you have not heard back within a few days. You may see your results on mychart before we have a chance to review them but we will give you a call once they are reviewed by [place]. If we ordered any referrals today, please let us know if you have not heard from their office within the next week.   Please try these tips to maintain a healthy lifestyle:  Eat at least 3 REAL meals and 1-2 snacks per day.  Aim for no more than 5 hours between eating.  If you eat breakfast, please do so within one hour of getting up.   Each meal should contain half fruits/vegetables, one quarter protein, and one quarter carbs (no bigger than a computer mouse)  Cut down on sweet beverages. This includes juice, soda, and sweet tea.   Drink at least 1 glass of water with each meal and aim for at least 8 glasses per day  Exercise at least 150 minutes every week.    Preventive Care 49-23 Years Old, Male Preventive care refers to lifestyle choices and visits with your health care provider that can promote health and wellness. Preventive care visits are also called wellness exams. What can I expect for my preventive care visit? Counseling During your preventive care visit, your health care provider may ask about your: Medical history, including: Past medical problems. Family medical history. Current health, including: Emotional well-being. Home life and relationship well-being. Sexual  activity. Lifestyle, including: Alcohol, nicotine or tobacco, and drug use. Access to firearms. Diet, exercise, and sleep habits. Safety issues such as seatbelt and bike helmet use. Sunscreen use. Work and work Statistician. Physical exam Your health care provider will check your: Height and weight. These may be used to calculate your BMI (body mass index). BMI is a measurement that tells if you are at a healthy weight. Waist circumference. This measures the distance around your waistline. This measurement also tells if you are at a healthy weight and may help predict your risk of certain diseases, such as type 2 diabetes and high blood pressure. Heart rate and blood pressure. Body temperature. Skin for abnormal spots. What immunizations do I need?  Vaccines are usually given at various ages, according to a schedule. Your health care provider will recommend vaccines for you based on your age, medical history, and lifestyle or other factors, such as travel or where you work. What tests do I need? Screening Your health care provider may recommend screening tests for certain conditions. This may include: Lipid and cholesterol levels. Diabetes screening. This is done by checking your blood sugar (glucose) after you have not eaten for a while (fasting). Hepatitis B test. Hepatitis C test. HIV (human immunodeficiency virus) test. STI (sexually transmitted infection) testing, if you are at risk. Lung cancer screening. Prostate cancer screening. Colorectal cancer screening. Talk with your health care provider  about your test results, treatment options, and if necessary, the need for more tests. Follow these instructions at home: Eating and drinking  Eat a diet that includes fresh fruits and vegetables, whole grains, lean protein, and low-fat dairy products. Take vitamin and mineral supplements as recommended by your health care provider. Do not drink alcohol if your health care provider  tells you not to drink. If you drink alcohol: Limit how much you have to 0-2 drinks a day. Know how much alcohol is in your drink. In the [place] one drink equals one 12 oz bottle of beer (355 mL), one 5 oz glass of wine (148 mL), or one 1 oz glass of hard liquor (44 mL). Lifestyle Brush your teeth every morning and night with fluoride toothpaste. Floss one time each day. Exercise for at least 30 minutes 5 or more days each week. Do not use any products that contain nicotine or tobacco. These products include cigarettes, chewing tobacco, and vaping devices, such as e-cigarettes. If you need help quitting, ask your health care provider. Do not use drugs. If you are sexually active, practice safe sex. Use a condom or other form of protection to prevent STIs. Take aspirin only as told by your health care provider. Make sure that you understand how much to take and what form to take. Work with your health care provider to find out whether it is safe and beneficial for you to take aspirin daily. Find healthy ways to manage stress, such as: Meditation, yoga, or listening to music. Journaling. Talking to a trusted person. Spending time with friends and family. Minimize exposure to UV radiation to reduce your risk of skin cancer. Safety Always wear your seat belt while driving or riding in a vehicle. Do not drive: If you have been drinking alcohol. Do not ride with someone who has been drinking. When you are tired or distracted. While texting. If you have been using any mind-altering substances or drugs. Wear a helmet and other protective equipment during sports activities. If you have firearms in your house, make sure you follow all gun safety procedures. What's next? Go to your health care provider once a year for an annual wellness visit. Ask your health care provider how often you should have your eyes and teeth checked. Stay up to date on all vaccines. This information is not intended to  replace advice given to you by your health care provider. Make sure you discuss any questions you have with your health care provider. Document Revised: 06/30/2020 Document Reviewed: 06/30/2020 Elsevier Patient Education  [employer].

## 2021-09-16 NOTE — Assessment & Plan Note (Signed)
Check A1c. 

## 2021-09-16 NOTE — Assessment & Plan Note (Signed)
Follows with urology and oncology.  Recently had radiation therapy.

## 2021-09-20 ENCOUNTER — Other Ambulatory Visit: Payer: Commercial Managed Care - HMO

## 2021-09-21 ENCOUNTER — Ambulatory Visit
Admission: RE | Admit: 2021-09-21 | Discharge: 2021-09-21 | Disposition: A | Discharge status: Home / Self Care | Payer: Commercial Managed Care - HMO | Source: Ambulatory Visit | Attending: Family Medicine | Admitting: Family Medicine

## 2021-09-21 DIAGNOSIS — R222 Localized swelling, mass and lump, trunk: Secondary | ICD-10-CM

## 2021-09-21 DIAGNOSIS — Z0001 Encounter for general adult medical examination with abnormal findings: Secondary | ICD-10-CM

## 2021-09-22 NOTE — Progress Notes (Signed)
Please inform patient of the following:  His ultrasound shows that he has a small hernia.  This is not a cause for immediate concern however recommend referral to surgery for further evaluation and management.

## 2021-09-26 ENCOUNTER — Other Ambulatory Visit: Payer: Self-pay | Admitting: *Deleted

## 2021-09-26 DIAGNOSIS — K429 Umbilical hernia without obstruction or gangrene: Secondary | ICD-10-CM

## 2021-09-28 ENCOUNTER — Other Ambulatory Visit (INDEPENDENT_AMBULATORY_CARE_PROVIDER_SITE_OTHER): Payer: Commercial Managed Care - HMO

## 2021-09-28 DIAGNOSIS — R7303 Prediabetes: Secondary | ICD-10-CM

## 2021-09-28 DIAGNOSIS — Z1159 Encounter for screening for other viral diseases: Secondary | ICD-10-CM

## 2021-09-28 DIAGNOSIS — Z0001 Encounter for general adult medical examination with abnormal findings: Secondary | ICD-10-CM

## 2021-09-28 LAB — COMPREHENSIVE METABOLIC PANEL
ALT: 18 U/L (ref 0–53)
AST: 19 U/L (ref 0–37)
Albumin: 4 g/dL (ref 3.5–5.2)
Alkaline Phosphatase: 81 U/L (ref 39–117)
BUN: 18 mg/dL (ref 6–23)
CO2: 28 mEq/L (ref 19–32)
Calcium: 9.5 mg/dL (ref 8.4–10.5)
Chloride: 104 mEq/L (ref 96–112)
Creatinine, Ser: 1.14 mg/dL (ref 0.40–1.50)
GFR: 75.49 mL/min (ref >60.00)
Glucose, Bld: 87 mg/dL (ref 70–99)
Potassium: 4.1 mEq/L (ref 3.5–5.1)
Sodium: 140 mEq/L (ref 135–145)
Total Bilirubin: 0.6 mg/dL (ref 0.2–1.2)
Total Protein: 7.2 g/dL (ref 6.0–8.3)

## 2021-09-28 LAB — CBC WITH DIFFERENTIAL/PLATELET
Basophils Absolute: 0 10*3/uL (ref 0.0–0.1)
Basophils Relative: 1.2 % (ref 0.0–3.0)
Eosinophils Absolute: 0.1 10*3/uL (ref 0.0–0.7)
Eosinophils Relative: 4.2 % (ref 0.0–5.0)
HCT: 39.1 % (ref 39.0–52.0)
Hemoglobin: 13.4 g/dL (ref 13.0–17.0)
Lymphocytes Relative: 29.5 % (ref 12.0–46.0)
Lymphs Abs: 0.8 10*3/uL (ref 0.7–4.0)
MCHC: 34.4 g/dL (ref 30.0–36.0)
MCV: 93.8 fl (ref 78.0–100.0)
Monocytes Absolute: 0.3 10*3/uL (ref 0.1–1.0)
Monocytes Relative: 11.7 % (ref 3.0–12.0)
Neutro Abs: 1.4 10*3/uL (ref 1.4–7.7)
Neutrophils Relative %: 53.4 % (ref 43.0–77.0)
Platelets: 167 10*3/uL (ref 150.0–400.0)
RBC: 4.17 Mil/uL — ABNORMAL LOW (ref 4.22–5.81)
RDW: 12.4 % (ref 11.5–15.5)
WBC: 2.6 10*3/uL — ABNORMAL LOW (ref 4.0–10.5)

## 2021-09-28 LAB — LIPID PANEL
Cholesterol: 151 mg/dL (ref 0–200)
HDL: 54.2 mg/dL (ref >39.00)
LDL Cholesterol: 79 mg/dL (ref 0–99)
NonHDL: 96.57
Total CHOL/HDL Ratio: 3
Triglycerides: 87 mg/dL (ref 0.0–149.0)
VLDL: 17.4 mg/dL (ref 0.0–40.0)

## 2021-09-28 LAB — TSH: TSH: 2.3 u[IU]/mL (ref 0.35–5.50)

## 2021-09-28 LAB — HEMOGLOBIN A1C: Hgb A1c MFr Bld: 5.5 % (ref 4.6–6.5)

## 2021-09-29 LAB — HEPATITIS C ANTIBODY: Hepatitis C Ab: NONREACTIVE

## 2021-10-03 NOTE — Progress Notes (Signed)
Please inform patient of the following:  White blood cell count was low but everything else is stable.  Can we have him come back in 1-2 weeks to recheck CBC with differential?  We can recheck everything else in a year.

## 2021-10-05 ENCOUNTER — Other Ambulatory Visit: Payer: Self-pay | Admitting: *Deleted

## 2021-10-05 DIAGNOSIS — D729 Disorder of white blood cells, unspecified: Secondary | ICD-10-CM

## 2021-10-17 ENCOUNTER — Telehealth: Payer: Self-pay | Admitting: Family Medicine

## 2021-10-17 NOTE — Telephone Encounter (Signed)
Message received to Scheduling In Basket  Please reach out to patient with information.    ----- Message -----      From:Stephen [name] "Stephen [name]"      Sent:10/15/2021 11:37 AM EDT        MH:[id number] Appointment Schedule Request Message List   Subject:Appointment Scheduled  I have been dealing with a typical head cold (not COVID) since last week. Given that this is to check my WBC, do I need to reschedule the appointment or will it be fine?   ----- Message -----      From: (proxy for [place] Team)      Sent:10/05/2021 11:59 AM EDT        LG:XQJJHER [name]"   Subject:Appointment Scheduled  Appointment Information:     Visit Type: Lab         Date: 10/20/2021                 Dept: [place]                 Provider: [facility]                 Time: 8:45 AM                 Length: 15 min   Appt Status: Scheduled

## 2021-10-19 NOTE — Telephone Encounter (Signed)
Per Admin, forwarding to Clinical Lead for follow up.

## 2021-10-19 NOTE — Telephone Encounter (Signed)
Called pt and informed him that he should be able to come in to hs lab appointment tomorrow 10/5, advised him to wear a mask for his visit. Pt voiced understanding and agreement.

## 2021-10-20 ENCOUNTER — Other Ambulatory Visit (INDEPENDENT_AMBULATORY_CARE_PROVIDER_SITE_OTHER): Payer: Commercial Managed Care - HMO

## 2021-10-20 DIAGNOSIS — D729 Disorder of white blood cells, unspecified: Secondary | ICD-10-CM

## 2021-10-20 LAB — CBC WITH DIFFERENTIAL/PLATELET
Basophils Absolute: 0 10*3/uL (ref 0.0–0.1)
Basophils Relative: 1.2 % (ref 0.0–3.0)
Eosinophils Absolute: 0.1 10*3/uL (ref 0.0–0.7)
Eosinophils Relative: 4.2 % (ref 0.0–5.0)
HCT: 38.1 % — ABNORMAL LOW (ref 39.0–52.0)
Hemoglobin: 13.3 g/dL (ref 13.0–17.0)
Lymphocytes Relative: 29 % (ref 12.0–46.0)
Lymphs Abs: 1 10*3/uL (ref 0.7–4.0)
MCHC: 34.9 g/dL (ref 30.0–36.0)
MCV: 93 fl (ref 78.0–100.0)
Monocytes Absolute: 0.4 10*3/uL (ref 0.1–1.0)
Monocytes Relative: 12.5 % — ABNORMAL HIGH (ref 3.0–12.0)
Neutro Abs: 1.8 10*3/uL (ref 1.4–7.7)
Neutrophils Relative %: 53.1 % (ref 43.0–77.0)
Platelets: 184 10*3/uL (ref 150.0–400.0)
RBC: 4.1 Mil/uL — ABNORMAL LOW (ref 4.22–5.81)
RDW: 12.6 % (ref 11.5–15.5)
WBC: 3.3 10*3/uL — ABNORMAL LOW (ref 4.0–10.5)

## 2021-10-25 NOTE — Progress Notes (Signed)
Please inform patient of the following:  Good news! Blood counts are returning to normal. Do not need to do any further testing at this point. We can recheck at his next office visit.  Stephen [name]. Jerline Pain, MD 10/25/2021 8:16 AM

## 2021-12-12 ENCOUNTER — Other Ambulatory Visit: Payer: Commercial Managed Care - HMO

## 2021-12-21 ENCOUNTER — Other Ambulatory Visit: Payer: Commercial Managed Care - HMO

## 2022-01-18 ENCOUNTER — Ambulatory Visit
Admission: RE | Admit: 2022-01-18 | Discharge: 2022-01-18 | Disposition: A | Discharge status: Home / Self Care | Payer: No Typology Code available for payment source | Source: Ambulatory Visit | Attending: Family Medicine | Admitting: Family Medicine

## 2022-01-18 ENCOUNTER — Other Ambulatory Visit: Payer: Commercial Managed Care - HMO

## 2022-01-18 DIAGNOSIS — R7303 Prediabetes: Secondary | ICD-10-CM

## 2022-01-18 DIAGNOSIS — Z0001 Encounter for general adult medical examination with abnormal findings: Secondary | ICD-10-CM

## 2022-01-19 NOTE — Progress Notes (Signed)
Please inform patient of the following:  His cardiac CT scan shows that he has a moderate amount of calcium build up in the arteries in his heart however this is more than we typically see for someone his age. This is not an urgent concern however we do need to be more aggressive about risk factor management. Recommend referral to cardiology for further evaluation and management.

## 2022-01-23 ENCOUNTER — Other Ambulatory Visit: Payer: Self-pay | Admitting: *Deleted

## 2022-01-23 DIAGNOSIS — R931 Abnormal findings on diagnostic imaging of heart and coronary circulation: Secondary | ICD-10-CM

## 2022-02-05 NOTE — Progress Notes (Signed)
Cardiology Office Note:   Date:  02/07/2022  NAME:  Stephen [name]    MRN: [id number] DOB:  [birth year]   PCP:  Vivi Barrack, MD  Cardiologist:  None  Electrophysiologist:  None   Referring MD: Vivi Barrack, MD   Chief Complaint  Patient presents with   coronary calcium   History of Present Illness:   Stephen [name] is a 52 y.o. male with a hx of HLD who is being seen today for the evaluation of elevated coronary calcium score at the request of Vivi Barrack, MD. he reports he is doing well.  Sent for evaluation of calcium score.  Elevated score.  Denies any chest pain or trouble breathing.  He plays pickle ball 2 hours 2 days/week.  He tells me he can do this without any limitations other than routine shortness of breath which is expected.  He is not diabetic.  LDL cholesterol 79.  He reports his parents died in a car crash 15 years ago.  No strong family history of heart disease.  He does have sleep apnea.  He uses his mask.  He is married.  He has 2 children.  They are out of the house.  He does have a history of prostate cancer underwent radiation therapy.  He does not smoke.  No alcohol or drug use.  He works in Engineer, technical sales.  He works from home.  He reports he is fairly sedentary but does play pickle ball 2 times per week.  Thyroid studies are normal.  CV exam normal.  EKG is normal.  We discussed routine preventive measures.  We also discussed starting Lipitor.  Problem List Coronary calcium -484 (98th percentile) 2. HLD -T chol 151, HDL 54, LDL 79, TG 87 3. Prostate CA -prostatectomy/radiation  4. OSA  Past Medical History: Past Medical History:  Diagnosis Date   Basal cell carcinoma    multiple skin cancers removed, prostate cancer    Degenerative disc disease, lumbar    Left shoulder pain    frozen shoulder per tp    Prostate CA ([place])    Ruptured cervical disc    Sleep apnea    no cpap    Past Surgical History: Past Surgical History:  Procedure Laterality Date    APPENDECTOMY     EYE MUSCLE SURGERY     2010/2011--lazy   LYMPHADENECTOMY Bilateral 05/27/2020   Procedure: LYMPHADENECTOMY, PELVIC;  Surgeon: Raynelle Bring, MD;  Location: [facility];  Service: Urology;  Laterality: Bilateral;   ROBOT ASSISTED LAPAROSCOPIC RADICAL PROSTATECTOMY N/A 05/27/2020   Procedure: XI ROBOTIC ASSISTED LAPAROSCOPIC RADICAL PROSTATECTOMY LEVEL 2;  Surgeon: Raynelle Bring, MD;  Location: [facility];  Service: Urology;  Laterality: N/A;   WISDOM TOOTH EXTRACTION      Current Medications: Current Meds  Medication Sig   atorvastatin (LIPITOR) 10 MG tablet Take 1 tablet (10 mg total) by mouth daily.   cyclobenzaprine (FLEXERIL) 10 MG tablet TAKE 1 TABLET BY MOUTH AS NEEDED FOR MUSCLE SPASMS.     Allergies:    Penicillins and Nsaids   Social History: Social History   Socioeconomic History   Marital status: Married    Spouse name: Not on file   Number of children: 2   Years of education: Not on file   Highest education level: Not on file  Occupational History   Occupation: Chartered certified accountant - [employer]  Tobacco Use   Smoking status: Never   Smokeless tobacco: Never  Vaping Use   Vaping Use: Never  used  Substance and Sexual Activity   Alcohol use: Yes    Alcohol/week: 4.0 standard drinks of alcohol    Types: 4 Standard drinks or equivalent per week    Comment: Socially   Drug use: No   Sexual activity: Yes    Partners: Female  Other Topics Concern   Not on file  Social History Narrative   Not on file   Social Determinants of Health   Financial Resource Strain: Not on file  Food Insecurity: Not on file  Transportation Needs: Not on file  Physical Activity: Not on file  Stress: Not on file  Social Connections: Not on file     Family History: The patient's family history includes Esophageal cancer (age of onset: 12) in his maternal grandfather; Hypertension in his mother; Ovarian cancer (age of onset: 65) in his maternal grandmother. There is no history of  Colon cancer, Colon polyps, Rectal cancer, or Stomach cancer.  ROS:   All other ROS reviewed and negative. Pertinent positives noted in the HPI.     EKGs/Labs/Other Studies Reviewed:   The following studies were personally reviewed by me today:  EKG:  EKG is ordered today.  The ekg ordered today demonstrates sinus rhythm heart rate 60, no acute ischemic changes or evidence of infarction, and was personally reviewed by me.   Recent Labs: 09/28/2021: ALT 18; BUN 18; Creatinine, Ser 1.14; Potassium 4.1; Sodium 140; TSH 2.30 10/20/2021: Hemoglobin 13.3; Platelets 184.0   Recent Lipid Panel    Component Value Date/Time   CHOL 151 09/28/2021 1221   TRIG 87.0 09/28/2021 1221   HDL 54.20 09/28/2021 1221   CHOLHDL 3 09/28/2021 1221   VLDL 17.4 09/28/2021 1221   LDLCALC 79 09/28/2021 1221    Physical Exam:   VS:  BP 118/76   Pulse 60   Ht '6\' 1"'$  (1.854 m)   Wt 300 lb (136.1 kg)   BMI 39.58 kg/m    Wt Readings from Last 3 Encounters:  02/07/22 300 lb (136.1 kg)  09/16/21 288 lb 6.4 oz (130.8 kg)  07/14/21 296 lb 11.2 oz (134.6 kg)    General: Well nourished, well developed, in no acute distress Head: Atraumatic, normal size  Eyes: PEERLA, EOMI  Neck: Supple, no JVD Endocrine: No thryomegaly Cardiac: Normal S1, S2; RRR; no murmurs, rubs, or gallops Lungs: Clear to auscultation bilaterally, no wheezing, rhonchi or rales  Abd: Soft, nontender, no hepatomegaly  Ext: No edema, pulses 2+ Musculoskeletal: No deformities, BUE and BLE strength normal and equal Skin: Warm and dry, no rashes   Neuro: Alert and oriented to person, place, time, and situation, CNII-XII grossly intact, no focal deficits  Psych: Normal mood and affect   ASSESSMENT:   [name] is a 52 y.o. male who presents for the following: 1. Coronary artery disease involving native coronary artery of native heart without angina pectoris   2. Agatston coronary artery calcium score greater than 400   3. Mixed  hyperlipidemia     PLAN:   1. Agatston coronary artery calcium score greater than 400 2. Mixed hyperlipidemia 3. Coronary artery disease involving native coronary artery of native heart without angina pectoris -Elevated coronary calcium score above 400.  Stress testing is indicated as a 2B recommendation.  No symptoms of chest discomfort but I would like to make sure he does not have any evidence of abnormality that may merit invasive angiography.  His EKG shows normal sinus rhythm.  He is without chest pain symptoms.  Okay to hold on aspirin.  Calcium score not above 1000.  I would recommend Lipitor 10 mg daily.  LDL cholesterol should be less than 70.  He had an echo in 2019 that was unremarkable.  I would like to check an LP(a).  We will arrange an exercise nuclear stress test to exclude any high risk disease.  He will see [place] back yearly for routine preventative measures.  We discussed appropriate exercise as well as weight loss.  I believe this will help him immensely.  He will see [place] back in 1 year.  Shared Decision Making/Informed Consent The risks [chest pain, shortness of breath, cardiac arrhythmias, dizziness, blood pressure fluctuations, myocardial infarction, stroke/transient ischemic attack, nausea, vomiting, allergic reaction, radiation exposure, metallic taste sensation and life-threatening complications (estimated to be 1 in 10,000)], benefits (risk stratification, diagnosing coronary artery disease, treatment guidance) and alternatives of a nuclear stress test were discussed in detail with Mr. Dimmick and he agrees to proceed.  Disposition: Return in about 1 year (around 02/08/2023).  Medication Adjustments/Labs and Tests Ordered: Current medicines are reviewed at length with the patient today.  Concerns regarding medicines are outlined above.  Orders Placed This Encounter  Procedures   Lipoprotein A (LPA)   Lipid panel   Cardiac Stress Test: Informed Consent Details:  Physician/Practitioner Attestation; Transcribe to consent form and obtain patient signature   MYOCARDIAL PERFUSION IMAGING   EKG 12-Lead   Meds ordered this encounter  Medications   atorvastatin (LIPITOR) 10 MG tablet    Sig: Take 1 tablet (10 mg total) by mouth daily.    Dispense:  90 tablet    Refill:  3    Patient Instructions  Medication Instructions:  Lipitor 10 mg daily *If you need a refill on your cardiac medications before your next appointment, please call your pharmacy*   Lab Work: LPa today 2 months come back for FASTING LIPID PANEL If you have labs (blood work) drawn today and your tests are completely normal, you will receive your results only by: [place] (if you have MyChart) OR A paper copy in the mail If you have any lab test that is abnormal or we need to change your treatment, we will call you to review the results.   Testing/Procedures: Dr Audie Box has ordered a Myocardial Perfusion Imaging Study.   The test will take approximately 3 to 4 hours to complete; you may bring reading material.  If someone comes with you to your appointment, they will need to remain in the main lobby due to limited space in the testing area. **If you are pregnant or breastfeeding, please notify the nuclear lab prior to your appointment**  You will need to hold the following medications prior to your stress test: beta-blockers (24 hours prior to test)   How to prepare for your Myocardial Perfusion Test: Do not eat or drink 3 hours prior to your test, except you may have water. Do not consume products containing caffeine (regular or decaffeinated) 12 hours prior to your test. (ex: coffee, chocolate, sodas, tea). Do wear comfortable clothes (no dresses or overalls) and walking shoes, tennis shoes preferred (No heels or open toe shoes are allowed). Do NOT wear cologne, perfume, aftershave, or lotions (deodorant is allowed). If these instructions are not followed, your test will  have to be rescheduled.    Follow-Up: At [facility], you and your health needs are our priority.  As part of our continuing mission to provide you with exceptional heart care,  we have created designated Provider Care Teams.  These Care Teams include your primary Cardiologist (physician) and Advanced Practice Providers (APPs -  Physician Assistants and Nurse Practitioners) who all work together to provide you with the care you need, when you need it.  We recommend signing up for the patient portal called "MyChart".  Sign up information is provided on this After Visit Summary.  MyChart is used to connect with patients for Virtual Visits (Telemedicine).  Patients are able to view lab/test results, encounter notes, upcoming appointments, etc.  Non-urgent messages can be sent to your provider as well.   To learn more about what you can do with MyChart, go to [email or web address].    Your next appointment:   1 year(s)  Provider:   Any APP     Signed, [place] B[place] T. Audie Box, MD, [place]  [address], [place], [place] [address] [phone]  02/07/2022 9:13 AM

## 2022-02-07 ENCOUNTER — Encounter: Payer: Self-pay | Admitting: Cardiovascular Disease

## 2022-02-07 ENCOUNTER — Ambulatory Visit: Payer: Commercial Managed Care - HMO | Attending: Cardiovascular Disease | Admitting: Cardiovascular Disease

## 2022-02-07 VITALS — BP 118/76 | HR 60 | Ht 73.0 in | Wt 300.0 lb

## 2022-02-07 DIAGNOSIS — I2510 Atherosclerotic heart disease of native coronary artery without angina pectoris: Secondary | ICD-10-CM

## 2022-02-07 DIAGNOSIS — R931 Abnormal findings on diagnostic imaging of heart and coronary circulation: Secondary | ICD-10-CM

## 2022-02-07 DIAGNOSIS — E782 Mixed hyperlipidemia: Secondary | ICD-10-CM

## 2022-02-07 MED ORDER — ATORVASTATIN CALCIUM 10 MG PO TABS: 10.0000 mg | ORAL_TABLET | Freq: Every day | ORAL | 3 refills | Status: DC

## 2022-02-07 NOTE — Patient Instructions (Signed)
Medication Instructions:  Lipitor 10 mg daily *If you need a refill on your cardiac medications before your next appointment, please call your pharmacy*   Lab Work: LPa today 2 months come back for FASTING LIPID PANEL If you have labs (blood work) drawn today and your tests are completely normal, you will receive your results only by: [place] (if you have MyChart) OR A paper copy in the mail If you have any lab test that is abnormal or we need to change your treatment, we will call you to review the results.   Testing/Procedures: Dr Audie Box has ordered a Myocardial Perfusion Imaging Study.   The test will take approximately 3 to 4 hours to complete; you may bring reading material.  If someone comes with you to your appointment, they will need to remain in the main lobby due to limited space in the testing area. **If you are pregnant or breastfeeding, please notify the nuclear lab prior to your appointment**  You will need to hold the following medications prior to your stress test: beta-blockers (24 hours prior to test)   How to prepare for your Myocardial Perfusion Test: Do not eat or drink 3 hours prior to your test, except you may have water. Do not consume products containing caffeine (regular or decaffeinated) 12 hours prior to your test. (ex: coffee, chocolate, sodas, tea). Do wear comfortable clothes (no dresses or overalls) and walking shoes, tennis shoes preferred (No heels or open toe shoes are allowed). Do NOT wear cologne, perfume, aftershave, or lotions (deodorant is allowed). If these instructions are not followed, your test will have to be rescheduled.    Follow-Up: At [facility], you and your health needs are our priority.  As part of our continuing mission to provide you with exceptional heart care, we have created designated Provider Care Teams.  These Care Teams include your primary Cardiologist (physician) and Advanced Practice Providers (APPs -   Physician Assistants and Nurse Practitioners) who all work together to provide you with the care you need, when you need it.  We recommend signing up for the patient portal called "MyChart".  Sign up information is provided on this After Visit Summary.  MyChart is used to connect with patients for Virtual Visits (Telemedicine).  Patients are able to view lab/test results, encounter notes, upcoming appointments, etc.  Non-urgent messages can be sent to your provider as well.   To learn more about what you can do with MyChart, go to [email or web address].    Your next appointment:   1 year(s)  Provider:   Any APP

## 2022-02-08 LAB — LIPOPROTEIN A (LPA): Lipoprotein (a): 8.4 nmol/L (ref <75.0)

## 2022-02-23 ENCOUNTER — Telehealth (HOSPITAL_COMMUNITY): Payer: Self-pay | Admitting: *Deleted

## 2022-02-23 NOTE — Telephone Encounter (Signed)
Patient given detailed instructions per Myocardial Perfusion Study Information Sheet for the test on 02/27/2022 at 8:15. Patient notified to arrive 15 minutes early and that it is imperative to arrive on time for appointment to keep from having the test rescheduled.  If you need to cancel or reschedule your appointment, please call the office within 24 hours of your appointment. . Patient verbalized understanding.Stephen [name]

## 2022-02-27 ENCOUNTER — Ambulatory Visit (HOSPITAL_COMMUNITY): Payer: Commercial Managed Care - HMO | Attending: Cardiovascular Disease

## 2022-02-27 DIAGNOSIS — R931 Abnormal findings on diagnostic imaging of heart and coronary circulation: Secondary | ICD-10-CM

## 2022-02-27 DIAGNOSIS — E782 Mixed hyperlipidemia: Secondary | ICD-10-CM

## 2022-02-27 DIAGNOSIS — I2510 Atherosclerotic heart disease of native coronary artery without angina pectoris: Secondary | ICD-10-CM

## 2022-02-27 MED ORDER — TECHNETIUM TC 99M TETROFOSMIN IV KIT
32.1000 | PACK | Freq: Once | INTRAVENOUS | Status: AC | PRN
  Administered 2022-02-27: 32.1 via INTRAVENOUS

## 2022-02-28 ENCOUNTER — Ambulatory Visit (HOSPITAL_COMMUNITY): Payer: Commercial Managed Care - HMO | Attending: Cardiovascular Disease

## 2022-02-28 LAB — MYOCARDIAL PERFUSION IMAGING
Angina Index: 0
Duke Treadmill Score: 7
Estimated workload: 7
Exercise duration (min): 7 min
Exercise duration (sec): 15 s
LV dias vol: 101 mL (ref 62–150)
LV sys vol: 41 mL
MPHR: 171 {beats}/min
Nuc Stress EF: 59 %
Peak HR: 160 {beats}/min
Percent HR: 93 %
Rest HR: 62 {beats}/min
Rest Nuclear Isotope Dose: 31.6 mCi
SDS: 1
SRS: 0
SSS: 1
ST Depression (mm): 0 mm
Stress Nuclear Isotope Dose: 32.1 mCi
TID: 0.89

## 2022-02-28 MED ORDER — TECHNETIUM TC 99M TETROFOSMIN IV KIT
31.6000 | PACK | Freq: Once | INTRAVENOUS | Status: AC | PRN
  Administered 2022-02-28: 31.6 via INTRAVENOUS

## 2022-03-15 LAB — PSA: PSA: <0.015

## 2022-03-31 ENCOUNTER — Encounter: Payer: Self-pay | Admitting: Family Medicine

## 2022-04-28 IMAGING — PT NM PET TUM IMG SKULL BASE T - THIGH
1 series · 3 of 3 positions shown · non-contrast
Comparison: None.

CLINICAL DATA: Diagnosed with prostate cancer in Monday February, 2020.
Prostatectomy [REDACTED]. Increasing PSA.

EXAM:
NUCLEAR MEDICINE PET SKULL BASE TO THIGH
TECHNIQUE: 9.5 mCi F18 Piflufolastat (Pylarify) was injected intravenously.
Full-ring PET imaging was performed from the skull base to thigh
after the radiotracer. CT data was obtained and used for attenuation
correction and anatomic localization.

[Series 1060: results mm oncology reading · 3.0mm · 0.84mm/px · 3 of 3 slices shown]
[im 1/3]
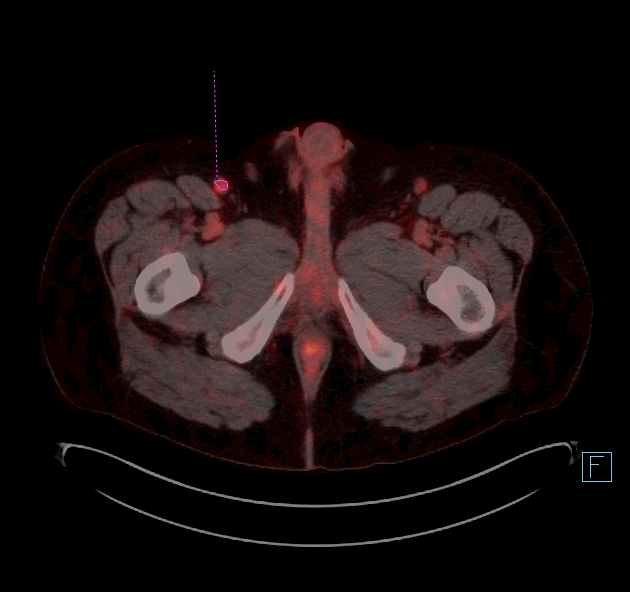
[im 2/3]
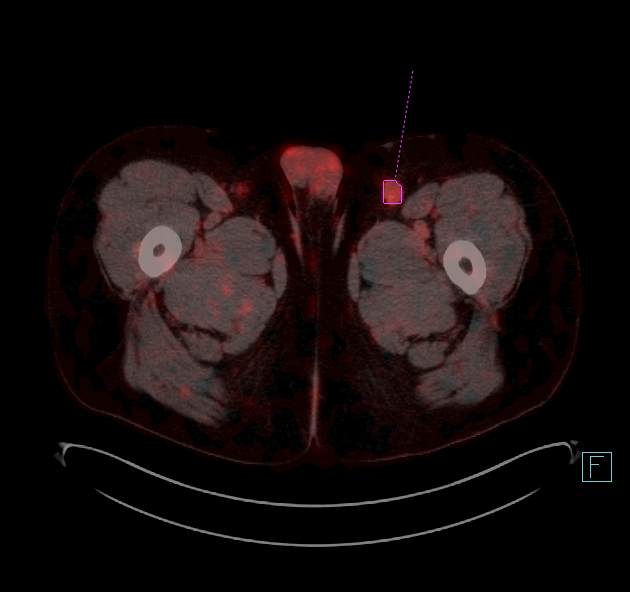
[im 3/3]
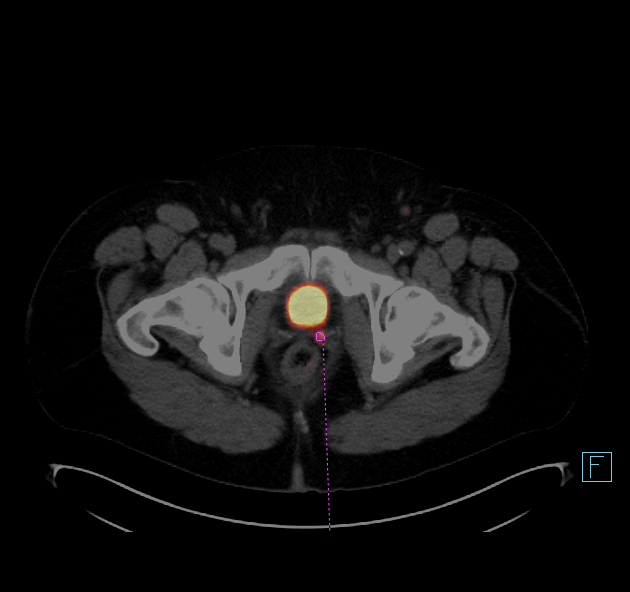

[3 of 3 positions shown; findings below may reference images not displayed]

FINDINGS: NECK

No abnormal tracer uptake.

Incidental CT finding: No cervical adenopathy.

CHEST

No radiotracer accumulation within mediastinal or hilar lymph nodes.
No suspicious pulmonary nodules on the CT scan.

Incidental CT finding: Lad coronary artery calcification including
on 98/4. Mild cardiomegaly.

ABDOMEN/PELVIS

Prostate: Although there is no well-defined residual or recurrent
mass, subtle eccentric left tracer uptake within the operative bed
measures a S.U.V. max of 4.1 on 225/4.

Lymph nodes: No abdominal retroperitoneal nodal uptake. No pelvic
sidewall node uptake. Tracer uptake within bilateral inguinal nodes.
Example on the right at up to 11 mm and a S.U.V. max of 4.0 on image
245/4. A left inguinal node measures 1.4 x 2.0 cm and a S.U.V. max
of 3.9 on 246/4.

Liver: No evidence of liver metastasis

Incidental CT finding: Normal adrenal glands. Presumed
cholecystectomy. Normal noncontrast appearance of the liver,
pancreas, gallbladder, spleen, kidneys. Decompressed urinary
bladder.

SKELETON

No focal  activity to suggest skeletal metastasis.
IMPRESSION: 1. Status post prostatectomy. Subtle tracer uptake within the left
side of the operative bed, without well-defined soft tissue mass.
Indeterminate. This could be correlated with physical exam.
Presuming no palpable abnormality, follow-up MRI at 3 months could
be performed to exclude developing subtle nodule in this region.
2. Bilateral inguinal nodes with tracer affinity, including a
dominant mild-to-moderately enlarged left inguinal node. Not in the
typical drainage pattern for prostate carcinoma. This could be
re-evaluated on above three-month follow-up MRI, or a more
aggressive approach with tissue sampling performed.
3. Age advanced coronary artery atherosclerosis. Recommend
assessment of coronary risk factors and consideration of medical
therapy.

## 2022-08-31 ENCOUNTER — Encounter (INDEPENDENT_AMBULATORY_CARE_PROVIDER_SITE_OTHER): Payer: Self-pay

## 2022-09-01 ENCOUNTER — Other Ambulatory Visit: Payer: Self-pay | Admitting: Oncology

## 2022-09-01 DIAGNOSIS — Z006 Encounter for examination for normal comparison and control in clinical research program: Secondary | ICD-10-CM

## 2022-09-19 ENCOUNTER — Encounter: Payer: Self-pay | Admitting: Family Medicine

## 2022-09-19 ENCOUNTER — Other Ambulatory Visit: Payer: Self-pay | Admitting: Medical Genetics

## 2022-09-19 ENCOUNTER — Ambulatory Visit (INDEPENDENT_AMBULATORY_CARE_PROVIDER_SITE_OTHER): Payer: Commercial Managed Care - HMO | Admitting: Family Medicine

## 2022-09-19 VITALS — BP 113/79 | HR 59 | Temp 97.5°F | Ht 73.0 in | Wt 306.4 lb

## 2022-09-19 DIAGNOSIS — R7303 Prediabetes: Secondary | ICD-10-CM

## 2022-09-19 DIAGNOSIS — E785 Hyperlipidemia, unspecified: Secondary | ICD-10-CM | POA: Diagnosis not present

## 2022-09-19 DIAGNOSIS — Z23 Encounter for immunization: Secondary | ICD-10-CM

## 2022-09-19 DIAGNOSIS — N529 Male erectile dysfunction, unspecified: Secondary | ICD-10-CM | POA: Insufficient documentation

## 2022-09-19 DIAGNOSIS — K429 Umbilical hernia without obstruction or gangrene: Secondary | ICD-10-CM

## 2022-09-19 DIAGNOSIS — Z006 Encounter for examination for normal comparison and control in clinical research program: Secondary | ICD-10-CM | POA: Diagnosis not present

## 2022-09-19 LAB — LIPID PANEL
Cholesterol: 117 mg/dL (ref 0–200)
HDL: 46.7 mg/dL (ref >39.00)
LDL Cholesterol: 52 mg/dL (ref 0–99)
NonHDL: 70.66
Total CHOL/HDL Ratio: 3
Triglycerides: 93 mg/dL (ref 0.0–149.0)
VLDL: 18.6 mg/dL (ref 0.0–40.0)

## 2022-09-19 LAB — CBC
HCT: 45.2 % (ref 39.0–52.0)
Hemoglobin: 14.9 g/dL (ref 13.0–17.0)
MCHC: 33 g/dL (ref 30.0–36.0)
MCV: 94.9 fl (ref 78.0–100.0)
Platelets: 179 10*3/uL (ref 150.0–400.0)
RBC: 4.76 Mil/uL (ref 4.22–5.81)
RDW: 13.2 % (ref 11.5–15.5)
WBC: 3.4 10*3/uL — ABNORMAL LOW (ref 4.0–10.5)

## 2022-09-19 LAB — COMPREHENSIVE METABOLIC PANEL
ALT: 17 U/L (ref 0–53)
AST: 19 U/L (ref 0–37)
Albumin: 4.1 g/dL (ref 3.5–5.2)
Alkaline Phosphatase: 81 U/L (ref 39–117)
BUN: 14 mg/dL (ref 6–23)
CO2: 27 meq/L (ref 19–32)
Calcium: 9.3 mg/dL (ref 8.4–10.5)
Chloride: 104 meq/L (ref 96–112)
Creatinine, Ser: 1.14 mg/dL (ref 0.40–1.50)
GFR: 74.98 mL/min (ref >60.00)
Glucose, Bld: 83 mg/dL (ref 70–99)
Potassium: 3.8 meq/L (ref 3.5–5.1)
Sodium: 141 meq/L (ref 135–145)
Total Bilirubin: 0.8 mg/dL (ref 0.2–1.2)
Total Protein: 7.6 g/dL (ref 6.0–8.3)

## 2022-09-19 LAB — HEMOGLOBIN A1C: Hgb A1c MFr Bld: 5.4 % (ref 4.6–6.5)

## 2022-09-19 MED ORDER — SILDENAFIL CITRATE 20 MG PO TABS: 20.0000 mg | ORAL_TABLET | Freq: Every day | ORAL | 3 refills | Status: AC | PRN

## 2022-09-19 MED ORDER — LIRAGLUTIDE 18 MG/3ML ~~LOC~~ SOPN: PEN_INJECTOR | SUBCUTANEOUS | 5 refills | Status: DC

## 2022-09-19 NOTE — Telephone Encounter (Signed)
Spoke with patient to verified immunization given by the pharmacy

## 2022-09-19 NOTE — Patient Instructions (Signed)
It was very nice to see you today!  We will check blood work today.  Will give your flu shot today.  Will try Victoza.  Please continue to work on diet and exercise.  I will refer you to see the surgeons office.  I will also send in sildenafil.  Return in about 1 year (around 09/19/2023) for Annual Physical.   Take care, Dr Jimmey Ralph  PLEASE NOTE:  If you had any lab tests, please let us know if you have not heard back within a few days. You may see your results on mychart before we have a chance to review them but we will give you a call once they are reviewed by [place].   If we ordered any referrals today, please let us know if you have not heard from their office within the next week.   If you had any urgent prescriptions sent in today, please check with the pharmacy within an hour of our visit to make sure the prescription was transmitted appropriately.   Please try these tips to maintain a healthy lifestyle:  Eat at least 3 REAL meals and 1-2 snacks per day.  Aim for no more than 5 hours between eating.  If you eat breakfast, please do so within one hour of getting up.   Each meal should contain half fruits/vegetables, one quarter protein, and one quarter carbs (no bigger than a computer mouse)  Cut down on sweet beverages. This includes juice, soda, and sweet tea.   Drink at least 1 glass of water with each meal and aim for at least 8 glasses per day  Exercise at least 150 minutes every week.    Preventive Care 22-81 Years Old, Male Preventive care refers to lifestyle choices and visits with your health care provider that can promote health and wellness. Preventive care visits are also called wellness exams. What can I expect for my preventive care visit? Counseling During your preventive care visit, your health care provider may ask about your: Medical history, including: Past medical problems. Family medical history. Current health, including: Emotional well-being. Home  life and relationship well-being. Sexual activity. Lifestyle, including: Alcohol, nicotine or tobacco, and drug use. Access to firearms. Diet, exercise, and sleep habits. Safety issues such as seatbelt and bike helmet use. Sunscreen use. Work and work Astronomer. Physical exam Your health care provider will check your: Height and weight. These may be used to calculate your BMI (body mass index). BMI is a measurement that tells if you are at a healthy weight. Waist circumference. This measures the distance around your waistline. This measurement also tells if you are at a healthy weight and may help predict your risk of certain diseases, such as type 2 diabetes and high blood pressure. Heart rate and blood pressure. Body temperature. Skin for abnormal spots. What immunizations do I need?  Vaccines are usually given at various ages, according to a schedule. Your health care provider will recommend vaccines for you based on your age, medical history, and lifestyle or other factors, such as travel or where you work. What tests do I need? Screening Your health care provider may recommend screening tests for certain conditions. This may include: Lipid and cholesterol levels. Diabetes screening. This is done by checking your blood sugar (glucose) after you have not eaten for a while (fasting). Hepatitis B test. Hepatitis C test. HIV (human immunodeficiency virus) test. STI (sexually transmitted infection) testing, if you are at risk. Lung cancer screening. Prostate cancer screening. Colorectal cancer  screening. Talk with your health care provider about your test results, treatment options, and if necessary, the need for more tests. Follow these instructions at home: Eating and drinking  Eat a diet that includes fresh fruits and vegetables, whole grains, lean protein, and low-fat dairy products. Take vitamin and mineral supplements as recommended by your health care provider. Do not  drink alcohol if your health care provider tells you not to drink. If you drink alcohol: Limit how much you have to 0-2 drinks a day. Know how much alcohol is in your drink. In the [place] one drink equals one 12 oz bottle of beer (355 mL), one 5 oz glass of wine (148 mL), or one 1 oz glass of hard liquor (44 mL). Lifestyle Brush your teeth every morning and night with fluoride toothpaste. Floss one time each day. Exercise for at least 30 minutes 5 or more days each week. Do not use any products that contain nicotine or tobacco. These products include cigarettes, chewing tobacco, and vaping devices, such as e-cigarettes. If you need help quitting, ask your health care provider. Do not use drugs. If you are sexually active, practice safe sex. Use a condom or other form of protection to prevent STIs. Take aspirin only as told by your health care provider. Make sure that you understand how much to take and what form to take. Work with your health care provider to find out whether it is safe and beneficial for you to take aspirin daily. Find healthy ways to manage stress, such as: Meditation, yoga, or listening to music. Journaling. Talking to a trusted person. Spending time with friends and family. Minimize exposure to UV radiation to reduce your risk of skin cancer. Safety Always wear your seat belt while driving or riding in a vehicle. Do not drive: If you have been drinking alcohol. Do not ride with someone who has been drinking. When you are tired or distracted. While texting. If you have been using any mind-altering substances or drugs. Wear a helmet and other protective equipment during sports activities. If you have firearms in your house, make sure you follow all gun safety procedures. What's next? Go to your health care provider once a year for an annual wellness visit. Ask your health care provider how often you should have your eyes and teeth checked. Stay up to date on all  vaccines. This information is not intended to replace advice given to you by your health care provider. Make sure you discuss any questions you have with your health care provider. Document Revised: 06/30/2020 Document Reviewed: 06/30/2020 Elsevier Patient Education  2024 [employer].

## 2022-09-19 NOTE — Assessment & Plan Note (Signed)
Check A1c.  He is interested in medications to help with weight loss.  He has previously done well with [place] however insurance would not pay for this now.  Will try Victoza.  We discussed potential side effects including nausea.  He will follow-up with [place] in a few weeks via MyChart.  If insurance does not pay for Victoza would consider trial of Trulicity or metformin.

## 2022-09-19 NOTE — Progress Notes (Signed)
Chief Complaint:  Stephen [name] is a 52 y.o. male who presents today for his annual comprehensive physical exam.    Assessment/Plan:  Chronic Problems Addressed Today: Erectile dysfunction Related to previous prostatectomy.  He has been on sildenafil in the past and has done well with this however his previous prescription ran out and would like for [place] to take this over.  We will refill today.  We did discuss potential side effects including headache and dizziness.  Prediabetes Check A1c.  He is interested in medications to help with weight loss.  He has previously done well with [place] however insurance would not pay for this now.  Will try Victoza.  We discussed potential side effects including nausea.  He will follow-up with [place] in a few weeks via MyChart.  If insurance does not pay for Victoza would consider trial of Trulicity or metformin.  Dyslipidemia Check lipids.  He is on Lipitor 10 mg daily and doing well.  Discussed lifestyle modifications.  Preventative Healthcare: Flu shot given today.  Check labs.  Up-to-date on cancer screening.  Patient Counseling(The following topics were reviewed and/or handout was given):  -Nutrition: Stressed importance of moderation in sodium/caffeine intake, saturated fat and cholesterol, caloric balance, sufficient intake of fresh fruits, vegetables, and fiber.  -Stressed the importance of regular exercise.   -Substance Abuse: Discussed cessation/primary prevention of tobacco, alcohol, or other drug use; driving or other dangerous activities under the influence; availability of treatment for abuse.   -Injury prevention: Discussed safety belts, safety helmets, smoke detector, smoking near bedding or upholstery.   -Sexuality: Discussed sexually transmitted diseases, partner selection, use of condoms, avoidance of unintended pregnancy and contraceptive alternatives.   -Dental health: Discussed importance of regular tooth brushing, flossing, and dental  visits.  -Health maintenance and immunizations reviewed. Please refer to Health maintenance section.  Return to care in 1 year for next preventative visit.     Subjective:  HPI:  He has no acute complaints today.  He was last seen about a year ago.  Since her last visit he has been following with urology for prostate cancer.  He has previously been on sildenafil to help with erectile dysfunction and would like for [place] to take over prescription for this.  He is also been trying to work on weight loss.  Try to work on reducing food intake however is having difficulty with this.    Lifestyle Diet: Balanced. Trying to manage portion sizes. Trying to get more fruits and vegetables.  Exercise: Joined [employer] but has not been recently. Likes to play pickleball.       09/19/2022    9:25 AM  Depression screen PHQ 2/9  Decreased Interest 2  Down, Depressed, Hopeless 1  PHQ - 2 Score 3  Altered sleeping 0  Tired, decreased energy 1  Change in appetite 2  Feeling bad or failure about yourself  0  Trouble concentrating 1  Moving slowly or fidgety/restless 0  Suicidal thoughts 0  PHQ-9 Score 7  Difficult doing work/chores Somewhat difficult    Health Maintenance Due  Topic Date Due   INFLUENZA VACCINE  08/17/2022     ROS: Per HPI, otherwise a complete review of systems was negative.   PMH:  The following were reviewed and entered/updated in epic: Past Medical History:  Diagnosis Date   Basal cell carcinoma    multiple skin cancers removed, prostate cancer    Degenerative disc disease, lumbar    Left shoulder pain  frozen shoulder per tp    Prostate CA (HCC)    Ruptured cervical disc    Sleep apnea    no cpap   Patient Active Problem List   Diagnosis Date Noted   Erectile dysfunction 09/19/2022   Dyslipidemia 09/19/2022   History of malignant neoplasm of skin 07/14/2021   Adhesive capsulitis of left shoulder 04/28/2020   Adverse reaction to NSAIDs 12/16/2019   Prostate  cancer [facility]) s/p prostatectomy 2022 10/21/2019   Tinnitus of both ears 09/15/2019   Recurrent low back pain 09/13/2018   Stasis dermatitis 09/13/2018   Prediabetes 02/03/2017   Bilateral lower extremity edema 02/02/2017   OSA (obstructive sleep apnea) 02/02/2017   Past Surgical History:  Procedure Laterality Date   APPENDECTOMY     EYE MUSCLE SURGERY     2010/2011--lazy   LYMPHADENECTOMY Bilateral 05/27/2020   Procedure: LYMPHADENECTOMY, PELVIC;  Surgeon: Heloise Purpura, MD;  Location: [facility];  Service: Urology;  Laterality: Bilateral;   ROBOT ASSISTED LAPAROSCOPIC RADICAL PROSTATECTOMY N/A 05/27/2020   Procedure: XI ROBOTIC ASSISTED LAPAROSCOPIC RADICAL PROSTATECTOMY LEVEL 2;  Surgeon: Heloise Purpura, MD;  Location: [facility];  Service: Urology;  Laterality: N/A;   WISDOM TOOTH EXTRACTION      Family History  Problem Relation Age of Onset   Hypertension Mother    Ovarian cancer Maternal Grandmother 76   Esophageal cancer Maternal Grandfather 70   Colon cancer Neg Hx    Colon polyps Neg Hx    Rectal cancer Neg Hx    Stomach cancer Neg Hx     Medications- reviewed and updated Current Outpatient Medications  Medication Sig Dispense Refill   atorvastatin (LIPITOR) 10 MG tablet Take 1 tablet (10 mg total) by mouth daily. 90 tablet 3   liraglutide (VICTOZA) 18 MG/3ML SOPN Inject 0.6 mg daily for 7 days, then 1.2 mg daily for 7 days, then 1.8 mg daily. 9 mL 5   sildenafil (REVATIO) 20 MG tablet Take 1-5 tablets (20-100 mg total) by mouth daily as needed (erectile dysfunction). 90 tablet 3   No current facility-administered medications for this visit.    Allergies-reviewed and updated Allergies  Allergen Reactions   Penicillins Nausea Only    Unknown - Childhood allergy   Nsaids     Pt told to avoid     Social History   Socioeconomic History   Marital status: Married    Spouse name: Not on file   Number of children: 2   Years of education: Not on file   Highest education  level: Not on file  Occupational History   Occupation: Acupuncturist - [employer]  Tobacco Use   Smoking status: Never   Smokeless tobacco: Never  Vaping Use   Vaping status: Never Used  Substance and Sexual Activity   Alcohol use: Yes    Alcohol/week: 4.0 standard drinks of alcohol    Types: 4 Standard drinks or equivalent per week    Comment: Socially   Drug use: No   Sexual activity: Yes    Partners: Female  Other Topics Concern   Not on file  Social History Narrative   Not on file   Social Determinants of Health   Financial Resource Strain: Not on file  Food Insecurity: Not on file  Transportation Needs: Not on file  Physical Activity: Not on file  Stress: Not on file  Social Connections: Not on file        Objective:  Physical Exam: BP 113/79   Pulse (!) 59  Temp (!) 97.5 F (36.4 C) (Temporal)   Ht 6\' 1"  (1.854 m)   Wt (!) 306 lb 6.4 oz (139 kg)   SpO2 98%   BMI 40.42 kg/m   Body mass index is 40.42 kg/m. Wt Readings from Last 3 Encounters:  09/19/22 (!) 306 lb 6.4 oz (139 kg)  02/27/22 300 lb (136.1 kg)  02/07/22 300 lb (136.1 kg)   Gen: NAD, resting comfortably HEENT: TMs normal bilaterally. OP clear. No thyromegaly noted.  CV: RRR with no murmurs appreciated Pulm: NWOB, CTAB with no crackles, wheezes, or rhonchi GI: Normal bowel sounds present. Soft, Nontender, Nondistended. MSK: no edema, cyanosis, or clubbing noted Skin: warm, dry Neuro: CN2-12 grossly intact. Strength 5/5 in upper and lower extremities. Reflexes symmetric and intact bilaterally.  Psych: Normal affect and thought content     Caylen Yardley M. Jimmey Ralph, MD 09/19/2022 10:02 AM

## 2022-09-19 NOTE — Assessment & Plan Note (Signed)
Related to previous prostatectomy.  He has been on sildenafil in the past and has done well with this however his previous prescription ran out and would like for [place] to take this over.  We will refill today.  We did discuss potential side effects including headache and dizziness.

## 2022-09-19 NOTE — Assessment & Plan Note (Signed)
Check lipids.  He is on Lipitor 10 mg daily and doing well.  Discussed lifestyle modifications.

## 2022-09-20 LAB — TSH: TSH: 1.99 u[IU]/mL (ref 0.35–5.50)

## 2022-09-20 NOTE — Telephone Encounter (Signed)
Ok to send in Trulicity 0.75 mg weekly for 4 weeks and then increase to 1.5 mg weekly for 4 weeks.  He should send [place] a message in a few weeks once he gets started on the medication and let us know how he is doing before he increases the dose.  Side effects are similar to the Victoza-nausea, reflux, and constipation.  This usually improves after the first week or so but he should let us know if side effects are not tolerable.

## 2022-09-20 NOTE — Telephone Encounter (Signed)
Immunization updated.

## 2022-09-20 NOTE — Telephone Encounter (Signed)
See note

## 2022-09-21 NOTE — Progress Notes (Signed)
Great news!  Labs are all stable.  Do not need to make any changes to his treatment plan.  He should continue to work on diet and exercise and we can recheck everything in a year or so.

## 2022-09-26 ENCOUNTER — Other Ambulatory Visit (HOSPITAL_COMMUNITY): Payer: Self-pay

## 2022-09-27 ENCOUNTER — Other Ambulatory Visit (HOSPITAL_COMMUNITY): Payer: Self-pay

## 2022-09-28 ENCOUNTER — Telehealth: Payer: Self-pay

## 2022-09-28 ENCOUNTER — Other Ambulatory Visit: Payer: Self-pay | Admitting: *Deleted

## 2022-09-28 ENCOUNTER — Other Ambulatory Visit (HOSPITAL_COMMUNITY): Payer: Self-pay

## 2022-09-28 MED ORDER — TRULICITY 0.75 MG/0.5ML ~~LOC~~ SOAJ: 0.7500 mg | SUBCUTANEOUS | 1 refills | Status: DC

## 2022-09-28 NOTE — Telephone Encounter (Signed)
Rx send to pharmacy  

## 2022-09-28 NOTE — Telephone Encounter (Signed)
Pharmacy Patient Advocate Encounter   Received notification from Patient Pharmacy that prior authorization for Victoza 18MG /3ML pen-injectors is required/requested.   Insurance verification completed.   The patient is insured through [employer] .   Per test claim: PA required; PA submitted to [employer] via CoverMyMeds Key/confirmation #/EOC [id number] Status is pending

## 2022-10-05 NOTE — Telephone Encounter (Signed)
Pharmacy Patient Advocate Encounter  Received notification from [employer] that Prior Authorization for VICTOZA 18MG /3ML has been DENIED.  See denial reason below. No denial letter attached in CMM. Will attache denial letter to Media tab once received.   PA #/Case ID/Reference #: [id number]

## 2022-10-11 ENCOUNTER — Other Ambulatory Visit (HOSPITAL_COMMUNITY): Payer: Self-pay

## 2022-10-19 ENCOUNTER — Other Ambulatory Visit (HOSPITAL_COMMUNITY): Payer: Self-pay

## 2022-10-19 ENCOUNTER — Telehealth: Payer: Self-pay | Admitting: Pharmacy Technician

## 2022-10-19 NOTE — Telephone Encounter (Signed)
Pharmacy Patient Advocate Encounter   Received notification from CoverMyMeds that prior authorization for Trulicity 0.75MG /0.5ML auto-injectors is required/requested.   Insurance verification completed.   The patient is insured through [employer] .   Per test claim: PA required; PA submitted to [employer] via CoverMyMeds Key/confirmation #/EOC [id number] Status is pending

## 2022-10-24 NOTE — Telephone Encounter (Signed)
Pharmacy Patient Advocate Encounter  Received notification from [employer] that Prior Authorization for Trulicity 0.75MG /0.5ML auto-injectors has been DENIED.  Full denial letter will be uploaded to the media tab. See denial reason below.  There is no indication your patient has type 2 diabetes mellitus. Rosann Auerbach does not cover Bydureon, Byetta or Trulicity for any other indication because it is only approved for type 2 diabetes mellitus. Bydureon, Byetta and Trulicity are medically necessary when all of the following criteria are met (1 and 2): 1. Diagnosis of type 2 diabetes mellitus; and 2. Both of the following are met (A and B): A. Documented one of the following: i. Unable to achieve goal HbA1C despite metformin or metformin-containing regimen (meglitinides, sulfonylureas, or thiazolidinediones) at greater than or equal to 1,500 mg per day; ii. Intolerance to metformin 1,500 mg per day despite appropriate dose titration duration (for example, acute/chronic metabolic acidosis, sever renal dysfunction); iv. Not a candidate for metformin (for example, hepatic impairment, moderate renal dysfunction, unstable heart failure, individual is using an agent for a non-diabetic FDA-approved indication); v. Initial metformin combination therapy is clinically appropriate for elevated HbA1C(for example; HbA1C greater than 1.5% above goal); or vi. Initial metformin combination therapy is clinically appropriate in an individual with co-morbid conditions (such as ASCVD, heart failure, or CKD); and B. Individual will continue maximally tolerated metformin therapy, if not contraindicated per FDA label, intolerant, or otherwise not a candidate.   PA #/Case ID/Reference #: [id number]  Please be advised we currently do not have a Pharmacist to review denials, therefore you will need to process appeals accordingly as needed. Thanks for your support at this time. Contact for appeals are as follows: Phone: [phone], Fax:  [phone]

## 2022-11-01 ENCOUNTER — Other Ambulatory Visit: Payer: Self-pay | Admitting: Surgery

## 2022-11-01 ENCOUNTER — Other Ambulatory Visit (HOSPITAL_COMMUNITY)
Admission: RE | Admit: 2022-11-01 | Discharge: 2022-11-01 | Disposition: A | Discharge status: Home / Self Care | Payer: Commercial Managed Care - HMO | Source: Ambulatory Visit | Attending: Oncology | Admitting: Oncology

## 2022-11-01 DIAGNOSIS — K432 Incisional hernia without obstruction or gangrene: Secondary | ICD-10-CM

## 2022-11-01 DIAGNOSIS — Z006 Encounter for examination for normal comparison and control in clinical research program: Secondary | ICD-10-CM | POA: Insufficient documentation

## 2022-11-09 LAB — HELIX MOLECULAR SCREEN: Genetic Analysis Overall Interpretation: NEGATIVE

## 2022-11-14 ENCOUNTER — Inpatient Hospital Stay
Admission: RE | Admit: 2022-11-14 | Discharge: 2022-11-14 | Discharge status: Home / Self Care | Payer: Commercial Managed Care - HMO | Source: Ambulatory Visit | Attending: Surgery

## 2022-11-14 DIAGNOSIS — K432 Incisional hernia without obstruction or gangrene: Secondary | ICD-10-CM

## 2022-11-14 MED ORDER — IOPAMIDOL (ISOVUE-300) INJECTION 61%
500.0000 mL | Freq: Once | INTRAVENOUS | Status: AC | PRN
  Administered 2022-11-14: 85 mL via INTRAVENOUS

## 2022-12-21 ENCOUNTER — Encounter: Payer: Self-pay | Admitting: Family Medicine

## 2022-12-21 ENCOUNTER — Ambulatory Visit: Payer: 59 | Admitting: Family Medicine

## 2022-12-21 VITALS — BP 123/74 | HR 70 | Temp 97.8°F | Ht 73.0 in | Wt 315.0 lb

## 2022-12-21 DIAGNOSIS — Z6841 Body Mass Index (BMI) 40.0 and over, adult: Secondary | ICD-10-CM | POA: Diagnosis not present

## 2022-12-21 DIAGNOSIS — E785 Hyperlipidemia, unspecified: Secondary | ICD-10-CM

## 2022-12-21 DIAGNOSIS — H6123 Impacted cerumen, bilateral: Secondary | ICD-10-CM

## 2022-12-21 DIAGNOSIS — N529 Male erectile dysfunction, unspecified: Secondary | ICD-10-CM

## 2022-12-21 MED ORDER — ZEPBOUND 2.5 MG/0.5ML ~~LOC~~ SOAJ: 2.5000 mg | SUBCUTANEOUS | 0 refills | Status: DC

## 2022-12-21 NOTE — Assessment & Plan Note (Signed)
BMI 41.5.  He has been on GLP-1 agonist in the past and has done well however his previous insurance would not pay for these.  He is now on a new insurance would like to restart.  We discussed options.  Will start Zepbound 2.5 mg weekly.  Discussed side effects.  He will follow-up with [place] in a few weeks and we can titrate the dose as tolerated.

## 2022-12-21 NOTE — Assessment & Plan Note (Signed)
Stable on Lipitor 10mg daily.

## 2022-12-21 NOTE — Patient Instructions (Signed)
It was very nice to see you today!  We flushed out your ears today.  You can use Debrox or hydrogen peroxide as needed to prevent buildup.  Please let us know if you have any recurrence of symptoms.  We will start Zepbound.  Let me know in a few weeks how you are doing with this and we can increase the dose as needed.  Return if symptoms worsen or fail to improve.   Take care, Dr Jimmey Ralph  PLEASE NOTE:  If you had any lab tests, please let us know if you have not heard back within a few days. You may see your results on mychart before we have a chance to review them but we will give you a call once they are reviewed by [place].   If we ordered any referrals today, please let us know if you have not heard from their office within the next week.   If you had any urgent prescriptions sent in today, please check with the pharmacy within an hour of our visit to make sure the prescription was transmitted appropriately.   Please try these tips to maintain a healthy lifestyle:  Eat at least 3 REAL meals and 1-2 snacks per day.  Aim for no more than 5 hours between eating.  If you eat breakfast, please do so within one hour of getting up.   Each meal should contain half fruits/vegetables, one quarter protein, and one quarter carbs (no bigger than a computer mouse)  Cut down on sweet beverages. This includes juice, soda, and sweet tea.   Drink at least 1 glass of water with each meal and aim for at least 8 glasses per day  Exercise at least 150 minutes every week.

## 2022-12-21 NOTE — Progress Notes (Signed)
   Stephen [name] is a 52 y.o. male who presents today for an office visit.  Assessment/Plan:  New/Acute Problems: Cerumen Impaction  Successfully irrigated by RMA today.  He tolerated well.  He does have narrow ear canals bilaterally and does have increased risk for cerumen impaction in the future.  He can use over-the-counter Debrox or hydrogen peroxide as needed.  He will let us know if he has any recurrence.  Chronic Problems Addressed Today: Morbid obesity (HCC) BMI 41.5.  He has been on GLP-1 agonist in the past and has done well however his previous insurance would not pay for these.  He is now on a new insurance would like to restart.  We discussed options.  Will start Zepbound 2.5 mg weekly.  Discussed side effects.  He will follow-up with [place] in a few weeks and we can titrate the dose as tolerated.  Dyslipidemia Stable on Lipitor 10 mg daily.  Erectile dysfunction Stable on sildenafil as needed.     Subjective:  HPI:  See A/P for status of chronic conditions.  Patient is here today with concern for left ear pressure.  This started about a week ago. He did have a URI several weeks ago. Was having some itching and used a qtip to help with this. He did have some decreased hearing on his left side.        Objective:  Physical Exam: BP 123/74   Pulse 70   Temp 97.8 F (36.6 C) (Temporal)   Ht 6\' 1"  (1.854 m)   Wt (!) 315 lb (142.9 kg)   SpO2 95%   BMI 41.56 kg/m   Gen: No acute distress, resting comfortably HEENT: Cerumen flexion bilaterally however TMs clear after irrigation. CV: Regular rate and rhythm with no murmurs appreciated Pulm: Normal work of breathing, clear to auscultation bilaterally with no crackles, wheezes, or rhonchi Neuro: Grossly normal, moves all extremities Psych: Normal affect and thought content      Stephen [name] M. Jimmey Ralph, MD 12/21/2022 8:46 AM

## 2022-12-21 NOTE — Assessment & Plan Note (Signed)
Stable on sildenafil as needed. 

## 2023-02-19 ENCOUNTER — Ambulatory Visit: Payer: Self-pay | Admitting: General Surgery

## 2023-02-19 NOTE — Progress Notes (Signed)
 Surgery orders requested via Epic inbox.

## 2023-02-23 ENCOUNTER — Other Ambulatory Visit (HOSPITAL_COMMUNITY): Payer: Self-pay

## 2023-02-23 ENCOUNTER — Telehealth: Payer: Self-pay | Admitting: *Deleted

## 2023-02-23 ENCOUNTER — Telehealth: Payer: Self-pay

## 2023-02-23 NOTE — Telephone Encounter (Signed)
 Pharmacy Patient Advocate Encounter   Received notification from Pt Calls Messages that prior authorization for Zepbound  2.5mg /0.31ml is required/requested.   Insurance verification completed.   The patient is insured through [facility] .   Per test claim: PA required; PA submitted to above mentioned insurance via CoverMyMeds Key/confirmation #/EOC [facility] Status is pending

## 2023-02-23 NOTE — Patient Instructions (Signed)
 DUE TO COVID-19 ONLY TWO VISITORS  (aged 51 and older)  ARE ALLOWED TO COME WITH YOU AND STAY IN THE WAITING ROOM ONLY DURING PRE OP AND PROCEDURE.   **NO VISITORS ARE ALLOWED IN THE SHORT STAY AREA OR RECOVERY ROOM!!**  IF YOU WILL BE ADMITTED INTO THE HOSPITAL YOU ARE ALLOWED ONLY FOUR SUPPORT PEOPLE DURING VISITATION HOURS ONLY (7 AM -8PM)   The support person(s) must pass our screening, gel in and out, and wear a mask at all times, including in the patient's room. Patients must also wear a mask when staff or their support person are in the room. Visitors GUEST BADGE MUST BE WORN VISIBLY  One adult visitor may remain with you overnight and MUST be in the room by 8 P.M.     Your procedure is scheduled on: 03/16/23   Report to [facility] Main Entrance    Report to admitting at : 5:15 AM   Call this number if you have problems the morning of surgery [phone]   Do not eat food or drink: After Midnight.   FOLLOW ANY ADDITIONAL PRE OP INSTRUCTIONS YOU RECEIVED FROM YOUR SURGEON'S OFFICE!!!   Oral Hygiene is also important to reduce your risk of infection.                                    Remember - BRUSH YOUR TEETH THE MORNING OF SURGERY WITH YOUR REGULAR TOOTHPASTE  DENTURES WILL BE REMOVED PRIOR TO SURGERY PLEASE DO NOT APPLY "Poly grip" OR ADHESIVES!!!   Do NOT smoke after Midnight   Take these medicines the morning of surgery with A SIP OF WATER: NONE. Tylenol  as needed.  DO NOT TAKE ANY ORAL DIABETIC MEDICATIONS DAY OF YOUR SURGERY                   You may not have any metal on your body including hair pins, jewelry, and body piercing             Do not wear lotions, powders, perfumes/cologne, or deodorant              Men may shave face and neck.   Do not bring valuables to the hospital. [facility] IS NOT             RESPONSIBLE   FOR VALUABLES.   Contacts, glasses, or bridgework may not be worn into surgery.   Bring small overnight bag day of surgery.    DO NOT BRING YOUR HOME MEDICATIONS TO THE HOSPITAL. PHARMACY WILL DISPENSE MEDICATIONS LISTED ON YOUR MEDICATION LIST TO YOU DURING YOUR ADMISSION IN THE HOSPITAL!    Patients discharged on the day of surgery will not be allowed to drive home.  Someone NEEDS to stay with you for the first 24 hours after anesthesia.   Special Instructions: Bring a copy of your healthcare power of attorney and living will documents         the day of surgery if you haven't scanned them before.              Please read over the following fact sheets you were given: IF YOU HAVE QUESTIONS ABOUT YOUR PRE-OP INSTRUCTIONS PLEASE CALL [phone]    [facility] - Preparing for Surgery Before surgery, you can play an important role.  Because skin is not sterile, your skin needs to be as free of germs as  possible.  You can reduce the number of germs on your skin by washing with CHG (chlorahexidine gluconate) soap before surgery.  CHG is an antiseptic cleaner which kills germs and bonds with the skin to continue killing germs even after washing. Please DO NOT use if you have an allergy to CHG or antibacterial soaps.  If your skin becomes reddened/irritated stop using the CHG and inform your nurse when you arrive at Short Stay. Do not shave (including legs and underarms) for at least 48 hours prior to the first CHG shower.  You may shave your face/neck. Please follow these instructions carefully:  1.  Shower with CHG Soap the night before surgery and the  morning of Surgery.  2.  If you choose to wash your hair, wash your hair first as usual with your  normal  shampoo.  3.  After you shampoo, rinse your hair and body thoroughly to remove the  shampoo.                           4.  Use CHG as you would any other liquid soap.  You can apply chg directly  to the skin and wash                       Gently with a scrungie or clean washcloth.  5.  Apply the CHG Soap to your body ONLY FROM THE NECK DOWN.   Do not use on face/  open                           Wound or open sores. Avoid contact with eyes, ears mouth and genitals (private parts).                       Wash face,  Genitals (private parts) with your normal soap.             6.  Wash thoroughly, paying special attention to the area where your surgery  will be performed.  7.  Thoroughly rinse your body with warm water from the neck down.  8.  DO NOT shower/wash with your normal soap after using and rinsing off  the CHG Soap.                9.  Pat yourself dry with a clean towel.            10.  Wear clean pajamas.            11.  Place clean sheets on your bed the night of your first shower and do not  sleep with pets. Day of Surgery : Do not apply any lotions/deodorants the morning of surgery.  Please wear clean clothes to the hospital/surgery center.  FAILURE TO FOLLOW THESE INSTRUCTIONS MAY RESULT IN THE CANCELLATION OF YOUR SURGERY PATIENT SIGNATURE_________________________________  NURSE SIGNATURE__________________________________  ________________________________________________________________________

## 2023-02-23 NOTE — Telephone Encounter (Signed)
 Need PA for Zepbound 2.5 mg.

## 2023-02-26 ENCOUNTER — Encounter (HOSPITAL_COMMUNITY): Payer: Self-pay

## 2023-02-26 ENCOUNTER — Encounter (HOSPITAL_COMMUNITY)
Admission: RE | Admit: 2023-02-26 | Discharge: 2023-02-26 | Disposition: A | Discharge status: Home / Self Care | Payer: 59 | Source: Ambulatory Visit | Attending: General Surgery

## 2023-02-26 ENCOUNTER — Other Ambulatory Visit: Payer: Self-pay

## 2023-02-26 VITALS — BP 119/78 | HR 65 | Temp 98.4°F | Ht 73.0 in | Wt 313.0 lb

## 2023-02-26 DIAGNOSIS — Z01818 Encounter for other preprocedural examination: Secondary | ICD-10-CM

## 2023-02-26 DIAGNOSIS — Z01812 Encounter for preprocedural laboratory examination: Secondary | ICD-10-CM | POA: Insufficient documentation

## 2023-02-26 LAB — CBC
HCT: 42.3 % (ref 39.0–52.0)
Hemoglobin: 13.8 g/dL (ref 13.0–17.0)
MCH: 31 pg (ref 26.0–34.0)
MCHC: 32.6 g/dL (ref 30.0–36.0)
MCV: 95.1 fL (ref 80.0–100.0)
Platelets: 155 10*3/uL (ref 150–400)
RBC: 4.45 MIL/uL (ref 4.22–5.81)
RDW: 12.3 % (ref 11.5–15.5)
WBC: 3.5 10*3/uL — ABNORMAL LOW (ref 4.0–10.5)
nRBC: 0 % (ref 0.0–0.2)

## 2023-02-26 NOTE — Progress Notes (Signed)
 For Anesthesia: PCP - Rodney Clamp, MD  Cardiologist - N/A  Bowel Prep reminder:  Chest x-ray - CT cardio: 01/19/22 EKG -  Stress Test -  ECHO - 07/10/17 Cardiac Cath -  Pacemaker/ICD device last checked: Pacemaker orders received: Device Rep notified:  Spinal Cord Stimulator:  Sleep Study - Yes CPAP - Yes  Fasting Blood Sugar - N/A Checks Blood Sugar _____ times a day Date and result of last Hgb A1c-  Last dose of GLP1 agonist- N/A GLP1 instructions:   Last dose of SGLT-2 inhibitors- N/A SGLT-2 instructions:   Blood Thinner Instructions:N/A Aspirin Instructions: Last Dose:  Activity level: Can go up a flight of stairs and activities of daily living without stopping and without chest pain and/or shortness of breath   Able to exercise without chest pain and/or shortness of breath Anesthesia review:   Patient denies shortness of breath, fever, cough and chest pain at PAT appointment   Patient verbalized understanding of instructions that were given to them at the PAT appointment. Patient was also instructed that they will need to review over the PAT instructions again at home before surgery.

## 2023-02-26 NOTE — Telephone Encounter (Signed)
 Pharmacy Patient Advocate Encounter  Received notification from [employer] that Prior Authorization for Zepbound  2.5mg /0.75ml has been DENIED.  See denial reason below. No denial letter attached in CMM. Will attach denial letter to Media tab once received.   PA #/Case ID/Reference #: [id number]

## 2023-02-27 NOTE — Telephone Encounter (Signed)
Patient notified Rx denied, advise to call insurance for medication alternatives  Verbalized understanding

## 2023-03-15 NOTE — Anesthesia Preprocedure Evaluation (Signed)
 Anesthesia Evaluation  Patient identified by MRN, date of birth, ID band Patient awake    Reviewed: Allergy & Precautions, NPO status , Patient's Chart, lab work & pertinent test results  Airway Mallampati: III  TM Distance: >3 FB Neck ROM: Full    Dental  (+) Teeth Intact, Dental Advisory Given   Pulmonary sleep apnea    Pulmonary exam normal breath sounds clear to auscultation       Cardiovascular negative cardio ROS Normal cardiovascular exam Rhythm:Regular Rate:Normal     Neuro/Psych negative neurological ROS     GI/Hepatic Neg liver ROS,,,incisional hernia   Endo/Other    Class 3 obesity  Renal/GU negative Renal ROS   Prostate cancer     Musculoskeletal  (+) Arthritis ,    Abdominal   Peds  Hematology negative hematology ROS (+)   Anesthesia Other Findings   Reproductive/Obstetrics                             Anesthesia Physical Anesthesia Plan  ASA: 3  Anesthesia Plan: General   Post-op Pain Management: Tylenol PO (pre-op)* and Ketamine IV*   Induction: Intravenous  PONV Risk Score and Plan: 3 and Midazolam, Dexamethasone and Ondansetron  Airway Management Planned: Oral ETT  Additional Equipment: ClearSight  Intra-op Plan:   Post-operative Plan: Extubation in OR  Informed Consent: I have reviewed the patients History and Physical, chart, labs and discussed the procedure including the risks, benefits and alternatives for the proposed anesthesia with the patient or authorized representative who has indicated his/her understanding and acceptance.     Dental advisory given  Plan Discussed with: CRNA  Anesthesia Plan Comments: (2nd PIV after induction )       Anesthesia Quick Evaluation

## 2023-03-16 ENCOUNTER — Ambulatory Visit (HOSPITAL_BASED_OUTPATIENT_CLINIC_OR_DEPARTMENT_OTHER): Payer: 59 | Admitting: Certified Registered"

## 2023-03-16 ENCOUNTER — Other Ambulatory Visit: Payer: Self-pay

## 2023-03-16 ENCOUNTER — Observation Stay (HOSPITAL_COMMUNITY)
Admission: RE | Admit: 2023-03-16 | Discharge: 2023-03-17 | Disposition: A | Discharge status: Home / Self Care | Payer: 59 | Source: Ambulatory Visit | Attending: General Surgery | Admitting: General Surgery

## 2023-03-16 ENCOUNTER — Ambulatory Visit (HOSPITAL_COMMUNITY): Payer: 59 | Admitting: Certified Registered"

## 2023-03-16 ENCOUNTER — Encounter (HOSPITAL_COMMUNITY): Payer: Self-pay | Admitting: General Surgery

## 2023-03-16 ENCOUNTER — Encounter (HOSPITAL_COMMUNITY)
Admission: RE | Disposition: A | Discharge status: Home / Self Care | Payer: Self-pay | Source: Ambulatory Visit | Attending: General Surgery

## 2023-03-16 DIAGNOSIS — Z85828 Personal history of other malignant neoplasm of skin: Secondary | ICD-10-CM | POA: Insufficient documentation

## 2023-03-16 DIAGNOSIS — Z8546 Personal history of malignant neoplasm of prostate: Secondary | ICD-10-CM | POA: Insufficient documentation

## 2023-03-16 DIAGNOSIS — K432 Incisional hernia without obstruction or gangrene: Secondary | ICD-10-CM

## 2023-03-16 DIAGNOSIS — Z79899 Other long term (current) drug therapy: Secondary | ICD-10-CM | POA: Diagnosis not present

## 2023-03-16 HISTORY — PX: XI ROBOTIC ASSISTED VENTRAL HERNIA: SHX6789

## 2023-03-16 LAB — CBC
HCT: 43.7 % (ref 39.0–52.0)
Hemoglobin: 14.7 g/dL (ref 13.0–17.0)
MCH: 32.2 pg (ref 26.0–34.0)
MCHC: 33.6 g/dL (ref 30.0–36.0)
MCV: 95.6 fL (ref 80.0–100.0)
Platelets: 148 10*3/uL — ABNORMAL LOW (ref 150–400)
RBC: 4.57 MIL/uL (ref 4.22–5.81)
RDW: 12.2 % (ref 11.5–15.5)
WBC: 6.1 10*3/uL (ref 4.0–10.5)
nRBC: 0 % (ref 0.0–0.2)

## 2023-03-16 LAB — CREATININE, SERUM
Creatinine, Ser: 1.22 mg/dL (ref 0.61–1.24)
GFR, Estimated: >60 mL/min (ref >60)

## 2023-03-16 SURGERY — REPAIR, HERNIA, VENTRAL, ROBOT-ASSISTED
Anesthesia: General

## 2023-03-16 MED ORDER — ORAL CARE MOUTH RINSE: 15.0000 mL | Freq: Once | OROMUCOSAL | Status: AC

## 2023-03-16 MED ORDER — LACTATED RINGERS IV SOLN: INTRAVENOUS | Status: DC

## 2023-03-16 MED ORDER — GLYCOPYRROLATE 0.2 MG/ML IJ SOLN
INTRAMUSCULAR | Status: DC | PRN
  Administered 2023-03-16: .2 mg via INTRAVENOUS

## 2023-03-16 MED ORDER — PROPOFOL 10 MG/ML IV BOLUS
INTRAVENOUS | Status: DC | PRN
  Administered 2023-03-16: 30 mg via INTRAVENOUS
  Administered 2023-03-16: 170 mg via INTRAVENOUS

## 2023-03-16 MED ORDER — KETAMINE HCL 50 MG/5ML IJ SOSY
PREFILLED_SYRINGE | INTRAMUSCULAR | Status: AC
  Filled 2023-03-16: qty 5

## 2023-03-16 MED ORDER — KETAMINE HCL 10 MG/ML IJ SOLN: INTRAMUSCULAR | Status: DC | PRN

## 2023-03-16 MED ORDER — LACTATED RINGERS IV SOLN: INTRAVENOUS | Status: DC | PRN

## 2023-03-16 MED ORDER — HYDROMORPHONE HCL 1 MG/ML IJ SOLN: 1.0000 mg | INTRAMUSCULAR | Status: DC | PRN

## 2023-03-16 MED ORDER — ONDANSETRON HCL 4 MG/2ML IJ SOLN
INTRAMUSCULAR | Status: DC | PRN
  Administered 2023-03-16: 4 mg via INTRAVENOUS

## 2023-03-16 MED ORDER — BUPIVACAINE-EPINEPHRINE (PF) 0.25% -1:200000 IJ SOLN
INTRAMUSCULAR | Status: AC
  Filled 2023-03-16: qty 30

## 2023-03-16 MED ORDER — BUPIVACAINE-EPINEPHRINE (PF) 0.25% -1:200000 IJ SOLN
INTRAMUSCULAR | Status: DC | PRN
  Administered 2023-03-16: 50 mL

## 2023-03-16 MED ORDER — 0.9 % SODIUM CHLORIDE (POUR BTL) OPTIME
TOPICAL | Status: DC | PRN
Start: 2023-03-16 — End: 2023-03-16
  Administered 2023-03-16: 1000 mL

## 2023-03-16 MED ORDER — ACETAMINOPHEN 500 MG PO TABS
1000.0000 mg | ORAL_TABLET | Freq: Four times a day (QID) | ORAL | Status: DC
  Administered 2023-03-16 – 2023-03-17 (×3): 1000 mg via ORAL
  Filled 2023-03-16 (×3): qty 2

## 2023-03-16 MED ORDER — KETAMINE HCL 50 MG/5ML IJ SOSY
PREFILLED_SYRINGE | INTRAMUSCULAR | Status: DC | PRN
  Administered 2023-03-16: 50 mg via INTRAVENOUS

## 2023-03-16 MED ORDER — ACETAMINOPHEN 500 MG PO TABS
1000.0000 mg | ORAL_TABLET | ORAL | Status: AC
  Administered 2023-03-16: 1000 mg via ORAL
  Filled 2023-03-16: qty 2

## 2023-03-16 MED ORDER — FENTANYL CITRATE PF 50 MCG/ML IJ SOSY
PREFILLED_SYRINGE | INTRAMUSCULAR | Status: AC
  Filled 2023-03-16: qty 1

## 2023-03-16 MED ORDER — METHOCARBAMOL 500 MG PO TABS: 500.0000 mg | ORAL_TABLET | Freq: Three times a day (TID) | ORAL | Status: DC | PRN

## 2023-03-16 MED ORDER — ONDANSETRON HCL 4 MG/2ML IJ SOLN
INTRAMUSCULAR | Status: AC
  Filled 2023-03-16: qty 2

## 2023-03-16 MED ORDER — PHENYLEPHRINE HCL-NACL 20-0.9 MG/250ML-% IV SOLN
INTRAVENOUS | Status: DC | PRN
  Administered 2023-03-16: 50 ug/min via INTRAVENOUS

## 2023-03-16 MED ORDER — ROCURONIUM BROMIDE 10 MG/ML (PF) SYRINGE
PREFILLED_SYRINGE | INTRAVENOUS | Status: AC
  Filled 2023-03-16: qty 10

## 2023-03-16 MED ORDER — METHOCARBAMOL 1000 MG/10ML IJ SOLN: 500.0000 mg | Freq: Three times a day (TID) | INTRAMUSCULAR | Status: DC | PRN

## 2023-03-16 MED ORDER — MIDAZOLAM HCL 2 MG/2ML IJ SOLN
INTRAMUSCULAR | Status: AC
  Filled 2023-03-16: qty 2

## 2023-03-16 MED ORDER — GABAPENTIN 300 MG PO CAPS
300.0000 mg | ORAL_CAPSULE | ORAL | Status: AC
  Administered 2023-03-16: 300 mg via ORAL
  Filled 2023-03-16: qty 1

## 2023-03-16 MED ORDER — FENTANYL CITRATE (PF) 250 MCG/5ML IJ SOLN
INTRAMUSCULAR | Status: AC
  Filled 2023-03-16: qty 5

## 2023-03-16 MED ORDER — BUPIVACAINE LIPOSOME 1.3 % IJ SUSP
INTRAMUSCULAR | Status: AC
  Filled 2023-03-16: qty 20

## 2023-03-16 MED ORDER — DEXAMETHASONE SODIUM PHOSPHATE 10 MG/ML IJ SOLN
INTRAMUSCULAR | Status: DC | PRN
  Administered 2023-03-16: 8 mg via INTRAVENOUS

## 2023-03-16 MED ORDER — SUGAMMADEX SODIUM 200 MG/2ML IV SOLN
INTRAVENOUS | Status: AC
  Filled 2023-03-16: qty 4

## 2023-03-16 MED ORDER — CHLORHEXIDINE GLUCONATE 0.12 % MT SOLN
15.0000 mL | Freq: Once | OROMUCOSAL | Status: AC
  Administered 2023-03-16: 15 mL via OROMUCOSAL

## 2023-03-16 MED ORDER — ONDANSETRON HCL 4 MG/2ML IJ SOLN: 4.0000 mg | Freq: Once | INTRAMUSCULAR | Status: DC | PRN

## 2023-03-16 MED ORDER — GLYCOPYRROLATE 0.2 MG/ML IJ SOLN
INTRAMUSCULAR | Status: AC
  Filled 2023-03-16: qty 1

## 2023-03-16 MED ORDER — IBUPROFEN 200 MG PO TABS: 600.0000 mg | ORAL_TABLET | Freq: Four times a day (QID) | ORAL | 0 refills | Status: AC

## 2023-03-16 MED ORDER — FENTANYL CITRATE (PF) 250 MCG/5ML IJ SOLN
INTRAMUSCULAR | Status: DC | PRN
  Administered 2023-03-16: 150 ug via INTRAVENOUS

## 2023-03-16 MED ORDER — OXYCODONE HCL 5 MG PO TABS: 5.0000 mg | ORAL_TABLET | Freq: Three times a day (TID) | ORAL | 0 refills | Status: AC | PRN

## 2023-03-16 MED ORDER — ENOXAPARIN SODIUM 40 MG/0.4ML IJ SOSY
40.0000 mg | PREFILLED_SYRINGE | INTRAMUSCULAR | Status: DC
  Administered 2023-03-17: 40 mg via SUBCUTANEOUS
  Filled 2023-03-16: qty 0.4

## 2023-03-16 MED ORDER — OXYCODONE HCL 5 MG PO TABS
5.0000 mg | ORAL_TABLET | ORAL | Status: DC | PRN
  Administered 2023-03-16: 5 mg via ORAL
  Filled 2023-03-16: qty 1

## 2023-03-16 MED ORDER — CHLORHEXIDINE GLUCONATE CLOTH 2 % EX PADS: 6.0000 | MEDICATED_PAD | Freq: Once | CUTANEOUS | Status: DC

## 2023-03-16 MED ORDER — ACETAMINOPHEN 325 MG PO TABS: 650.0000 mg | ORAL_TABLET | Freq: Four times a day (QID) | ORAL | 0 refills | Status: AC

## 2023-03-16 MED ORDER — ROCURONIUM BROMIDE 100 MG/10ML IV SOLN
INTRAVENOUS | Status: DC | PRN
  Administered 2023-03-16: 30 mg via INTRAVENOUS
  Administered 2023-03-16 (×2): 10 mg via INTRAVENOUS
  Administered 2023-03-16: 60 mg via INTRAVENOUS
  Administered 2023-03-16: 100 mg via INTRAVENOUS
  Administered 2023-03-16: 10 mg via INTRAVENOUS

## 2023-03-16 MED ORDER — PROPOFOL 10 MG/ML IV BOLUS
INTRAVENOUS | Status: AC
  Filled 2023-03-16: qty 20

## 2023-03-16 MED ORDER — SUGAMMADEX SODIUM 200 MG/2ML IV SOLN
INTRAVENOUS | Status: DC | PRN
  Administered 2023-03-16: 400 mg via INTRAVENOUS

## 2023-03-16 MED ORDER — ATORVASTATIN CALCIUM 10 MG PO TABS
10.0000 mg | ORAL_TABLET | Freq: Every day | ORAL | Status: DC
  Administered 2023-03-16 – 2023-03-17 (×2): 10 mg via ORAL
  Filled 2023-03-16 (×2): qty 1

## 2023-03-16 MED ORDER — MIDAZOLAM HCL 5 MG/5ML IJ SOLN
INTRAMUSCULAR | Status: DC | PRN
  Administered 2023-03-16: 2 mg via INTRAVENOUS

## 2023-03-16 MED ORDER — CEFAZOLIN SODIUM-DEXTROSE 2-4 GM/100ML-% IV SOLN
INTRAVENOUS | Status: AC
  Filled 2023-03-16: qty 100

## 2023-03-16 MED ORDER — AMISULPRIDE (ANTIEMETIC) 5 MG/2ML IV SOLN: 10.0000 mg | Freq: Once | INTRAVENOUS | Status: DC | PRN

## 2023-03-16 MED ORDER — DEXMEDETOMIDINE HCL IN NACL 80 MCG/20ML IV SOLN
INTRAVENOUS | Status: DC | PRN
  Administered 2023-03-16: 8 ug via INTRAVENOUS

## 2023-03-16 MED ORDER — DEXMEDETOMIDINE HCL IN NACL 80 MCG/20ML IV SOLN
INTRAVENOUS | Status: AC
  Filled 2023-03-16: qty 20

## 2023-03-16 MED ORDER — BUPIVACAINE LIPOSOME 1.3 % IJ SUSP: 20.0000 mL | Freq: Once | INTRAMUSCULAR | Status: DC

## 2023-03-16 MED ORDER — FENTANYL CITRATE PF 50 MCG/ML IJ SOSY
25.0000 ug | PREFILLED_SYRINGE | INTRAMUSCULAR | Status: DC | PRN
  Administered 2023-03-16: 50 ug via INTRAVENOUS

## 2023-03-16 MED ORDER — CEFAZOLIN SODIUM 1 G IJ SOLR
INTRAMUSCULAR | Status: AC
  Filled 2023-03-16: qty 10

## 2023-03-16 MED ORDER — CEFAZOLIN SODIUM-DEXTROSE 3-4 GM/150ML-% IV SOLN
3.0000 g | INTRAVENOUS | Status: AC
  Administered 2023-03-16 (×2): 3 g via INTRAVENOUS
  Filled 2023-03-16: qty 150

## 2023-03-16 MED ORDER — LIDOCAINE HCL (CARDIAC) PF 100 MG/5ML IV SOSY
PREFILLED_SYRINGE | INTRAVENOUS | Status: DC | PRN
  Administered 2023-03-16: 100 mg via INTRAVENOUS

## 2023-03-16 MED ORDER — LIDOCAINE HCL (PF) 2 % IJ SOLN
INTRAMUSCULAR | Status: AC
  Filled 2023-03-16: qty 5

## 2023-03-16 MED ORDER — DEXAMETHASONE SODIUM PHOSPHATE 10 MG/ML IJ SOLN
INTRAMUSCULAR | Status: AC
  Filled 2023-03-16: qty 1

## 2023-03-16 SURGICAL SUPPLY — 53 items
ANTIFOG SOL W/FOAM PAD STRL (MISCELLANEOUS) ×1 IMPLANT
APPLICATOR COTTON TIP 6 STRL (MISCELLANEOUS) IMPLANT
APPLICATOR COTTON TIP 6IN STRL (MISCELLANEOUS) IMPLANT
BAG COUNTER SPONGE SURGICOUNT (BAG) IMPLANT
BLADE SURG SZ11 CARB STEEL (BLADE) ×1 IMPLANT
CHLORAPREP W/TINT 26 (MISCELLANEOUS) ×1 IMPLANT
COVER SURGICAL LIGHT HANDLE (MISCELLANEOUS) ×1 IMPLANT
COVER TIP SHEARS 8 DVNC (MISCELLANEOUS) ×1 IMPLANT
DERMABOND ADVANCED .7 DNX12 (GAUZE/BANDAGES/DRESSINGS) ×1 IMPLANT
DRAPE ARM DVNC X/XI (DISPOSABLE) ×4 IMPLANT
DRAPE COLUMN DVNC XI (DISPOSABLE) ×1 IMPLANT
DRIVER NDL LRG 8 DVNC XI (INSTRUMENTS) IMPLANT
DRIVER NDL MEGA SUTCUT DVNCXI (INSTRUMENTS) ×1 IMPLANT
DRIVER NDLE LRG 8 DVNC XI (INSTRUMENTS) ×1 IMPLANT
DRIVER NDLE MEGA SUTCUT DVNCXI (INSTRUMENTS) IMPLANT
ELECT PENCIL ROCKER SW 15FT (MISCELLANEOUS) ×1 IMPLANT
ELECT REM PT RETURN 15FT ADLT (MISCELLANEOUS) ×1 IMPLANT
FORCEPS BPLR FENES DVNC XI (FORCEP) IMPLANT
GAUZE 4X4 16PLY ~~LOC~~+RFID DBL (SPONGE) ×1 IMPLANT
GLOVE BIO SURGEON STRL SZ7.5 (GLOVE) ×2 IMPLANT
GOWN STRL REUS W/ TWL XL LVL3 (GOWN DISPOSABLE) ×2 IMPLANT
IRRIG SUCT STRYKERFLOW 2 WTIP (MISCELLANEOUS) IMPLANT
IRRIGATION SUCT STRKRFLW 2 WTP (MISCELLANEOUS) IMPLANT
KIT BASIN OR (CUSTOM PROCEDURE TRAY) ×1 IMPLANT
KIT TURNOVER KIT A (KITS) IMPLANT
MARKER SKIN DUAL TIP RULER LAB (MISCELLANEOUS) ×1 IMPLANT
MAT PREVALON FULL STRYKER (MISCELLANEOUS) IMPLANT
MESH SOFT 12X12IN BARD (Mesh General) IMPLANT
NDL HYPO 22X1.5 SAFETY MO (MISCELLANEOUS) ×1 IMPLANT
NDL INSUFFLATION 14GA 120MM (NEEDLE) ×1 IMPLANT
NEEDLE HYPO 22X1.5 SAFETY MO (MISCELLANEOUS) ×1 IMPLANT
NEEDLE INSUFFLATION 14GA 120MM (NEEDLE) ×1 IMPLANT
OBTURATOR OPTICAL STND 8 DVNC (TROCAR) ×1 IMPLANT
OBTURATOR OPTICALSTD 8 DVNC (TROCAR) ×1 IMPLANT
PACK CARDIOVASCULAR III (CUSTOM PROCEDURE TRAY) ×1 IMPLANT
SCISSORS MNPLR CVD DVNC XI (INSTRUMENTS) ×1 IMPLANT
SEAL UNIV 5-12 XI (MISCELLANEOUS) ×4 IMPLANT
SOL ELECTROSURG ANTI STICK (MISCELLANEOUS) ×1 IMPLANT
SOLUTION ANTFG W/FOAM PAD STRL (MISCELLANEOUS) ×1 IMPLANT
SOLUTION ELECTROSURG ANTI STCK (MISCELLANEOUS) ×1 IMPLANT
SPIKE FLUID TRANSFER (MISCELLANEOUS) ×1 IMPLANT
SUT MNCRL AB 4-0 PS2 18 (SUTURE) ×1 IMPLANT
SUT STRATA PDS 2-0 23 CT-1 (SUTURE) IMPLANT
SUT STRATAFIX 1PDS 45CM VIOLET (SUTURE) IMPLANT
SUT STRATAFIX SPIRAL PDS3-0 (SUTURE) IMPLANT
SUT VLOC 180 2-0 9IN GS21 (SUTURE) IMPLANT
SYR 10ML LL (SYRINGE) ×1 IMPLANT
SYR 20ML LL LF (SYRINGE) ×1 IMPLANT
TOWEL GREEN STERILE FF (TOWEL DISPOSABLE) ×1 IMPLANT
TOWEL OR 17X26 10 PK STRL BLUE (TOWEL DISPOSABLE) ×1 IMPLANT
TROCAR Z-THREAD FIOS 5X100MM (TROCAR) IMPLANT
TROCAR Z-THREAD OPTICAL 5X100M (TROCAR) IMPLANT
TUBING INSUFFLATION 10FT LAP (TUBING) ×1 IMPLANT

## 2023-03-16 NOTE — Anesthesia Procedure Notes (Signed)
 Procedure Name: Intubation Date/Time: 03/16/2023 7:35 AM  Performed by: Lezlie Lye, CRNAPre-anesthesia Checklist: Patient identified, Emergency Drugs available, Suction available and Patient being monitored Patient Re-evaluated:Patient Re-evaluated prior to induction Oxygen Delivery Method: Circle system utilized Preoxygenation: Pre-oxygenation with 100% oxygen Induction Type: IV induction Ventilation: Oral airway inserted - appropriate to patient size and Mask ventilation with difficulty Laryngoscope Size: Miller and 3 Grade View: Grade II Tube type: Oral Tube size: 8.0 mm Number of attempts: 1 Airway Equipment and Method: Stylet Placement Confirmation: ETT inserted through vocal cords under direct vision, positive ETCO2 and breath sounds checked- equal and bilateral Secured at: 23 cm Tube secured with: Tape Dental Injury: Teeth and Oropharynx as per pre-operative assessment

## 2023-03-16 NOTE — H&P (Signed)
 Stephen [name] Jan 23, 1970  [id number].    HPI:  53 y/o M w/ a hx of prostatectomy c/b incisional hernia who presents for elective repair. He reports that he is in his usual state of health and denies any recent changes in medication.   ROS: Review of Systems  Constitutional: Negative.   HENT: Negative.    Eyes: Negative.   Respiratory: Negative.    Cardiovascular: Negative.   Gastrointestinal: Negative.   Genitourinary: Negative.   Musculoskeletal: Negative.   Skin: Negative.   Neurological: Negative.   Endo/Heme/Allergies: Negative.   Psychiatric/Behavioral: Negative.      Family History  Problem Relation Age of Onset   Hypertension Mother    Ovarian cancer Maternal Grandmother 22   Esophageal cancer Maternal Grandfather 72   Colon cancer Neg Hx    Colon polyps Neg Hx    Rectal cancer Neg Hx    Stomach cancer Neg Hx     Past Medical History:  Diagnosis Date   Basal cell carcinoma    multiple skin cancers removed, prostate cancer    Degenerative disc disease, lumbar    Left shoulder pain    frozen shoulder per tp    Prostate CA (HCC)    Ruptured cervical disc    Sleep apnea    no cpap    Past Surgical History:  Procedure Laterality Date   APPENDECTOMY     EYE MUSCLE SURGERY     2010/2011--lazy   LYMPHADENECTOMY Bilateral 05/27/2020   Procedure: LYMPHADENECTOMY, PELVIC;  Surgeon: Heloise Purpura, MD;  Location: [facility];  Service: Urology;  Laterality: Bilateral;   ROBOT ASSISTED LAPAROSCOPIC RADICAL PROSTATECTOMY N/A 05/27/2020   Procedure: XI ROBOTIC ASSISTED LAPAROSCOPIC RADICAL PROSTATECTOMY LEVEL 2;  Surgeon: Heloise Purpura, MD;  Location: [facility];  Service: Urology;  Laterality: N/A;   WISDOM TOOTH EXTRACTION      Social History:  reports that he has never smoked. He has never used smokeless tobacco. He reports current alcohol use of about 4.0 standard drinks of alcohol per week. He reports that he does not use drugs.  Allergies:  Allergies  Allergen  Reactions   Penicillins     Unknown - Childhood allergy   Nsaids     Pt told to avoid     Medications Prior to Admission  Medication Sig Dispense Refill   acetaminophen (TYLENOL) 500 MG tablet Take 1,000 mg by mouth every 6 (six) hours as needed for moderate pain (pain score 4-6).     atorvastatin (LIPITOR) 10 MG tablet Take 1 tablet (10 mg total) by mouth daily. 90 tablet 3   ibuprofen (ADVIL) 200 MG tablet Take 400 mg by mouth every 6 (six) hours as needed for moderate pain (pain score 4-6).     sildenafil (REVATIO) 20 MG tablet Take 1-5 tablets (20-100 mg total) by mouth daily as needed (erectile dysfunction). 90 tablet 3   tirzepatide (ZEPBOUND) 2.5 MG/0.5ML Pen Inject 2.5 mg into the skin once a week. (Patient not taking: Reported on 02/20/2023) 2 mL 0    Physical Exam: Blood pressure 137/88, pulse 66, temperature 98.1 F (36.7 C), temperature source Oral, resp. rate 17, height 6\' 1"  (1.854 m), weight (!) 140.6 kg, SpO2 94%. Gen: male, NAD Abd: Easily reducible hernia, marked  No results found for this or any previous visit (from the past 48 hours). No results found.  Assessment/Plan 53 y/o M w/ an incisional hernia  - Will proceed to the OR. We discussed the alternatives and  potential risks of surgery, including but not limited to: bleeding, infection, damage to bowel or surrounding structures, mesh complications, chronic pain, recurrent hernia, and need for additional procedures. All questions were addressed and consent was obtained.   - Tentative plan for overnight stay for observation  [facility] 03/16/2023, 6:56 AM Please see Amion for pager number during day hours 7:00am-4:30pm or 7:00am -11:30am on weekends

## 2023-03-16 NOTE — Op Note (Signed)
 Stephen [name] ([id number])  Operative Report  Date 03/16/2023  PREOPERATIVE DIAGNOSIS: Incisional hernia (6cm x 4cm)  POSTOPERATIVE DIAGNOSIS:  Same  SURGEON: Melody Haver, MD  ANESTHESIA: General   PROCEDURE:   1. Diagnostic laparoscopy. 2. Robotic Muscular release of the left rectus muscle to facilitate retromuscular hernia repair (390 sq cm) 3. Robotic Muscular release of the right rectus muscle to facilitate retromuscular hernia repair (390 sq cm) 4. Robotic Rives-Stoppa hernia repair with mesh, Bard soft 25 cm x 30 cm mesh.  INTRAOPERATIVE FINDINGS: Incisional hernia with a fascial defect of 6 cm (craniocaudal) x 4 cm (transverse); total surface area for adjacent tissue transfer: 780 square centimeters.  ESTIMATED BLOOD LOSS:  HERNIA CHARACTERISTICS: Location: incisional Approach: Robotic Initial/Recurrent: Initial  Reducible/Incarcerated: reducible with some pressure Mesh Implantation: Yes - 25cm x 30cm Bard Soft Hernia Size: 6cm x 4cm Total Surface Area for Adjacent Tissue Transfer: 780 square centimeters  IMPLANTS: Implant Name Type Inv. Item Serial No. Manufacturer Lot No. LRB No. Used Action  MESH SOFT 12X12IN BARD - [id number] Mesh General MESH SOFT 12X12IN BARD  [employer] BARD ACCESS [id number] N/A 1 Implanted    COMPLICATIONS: None  SPECIMENS: None  OPERATIVE INDICATIONS: Pt is a 53 y.o. male with a history of prostate cancer s/p prostatectomy and radiation therapy.  He developed an incisional hernia at his extraction site.  Following a discussion with the patient, he elected to proceed with hernia repair.  The risks, benefits and alternatives of the procedure were explained to the patient and the patient agreed to proceed and signed informed consent in front of a witness.   DESCRIPTION OF PROCEDURE: After preoperative identifying the patient in holding, the patient was brought to the operating room and placed supine on the operating room table. SCDs  were placed as indicated. After induction of general anesthesia,  appropriate perioperative antibiotics were administered.  The operative site was prepped and draped in the typical sterile fashion.  A JACHO approved time out, where the name of the patient, the operation, and intended site was confirmed.  The procedure was begun.    The patient was appropriately marked to identify the linea alba as well as the linea semilunaris and, accessing the rectus sheath in the subcostal position, an optical trocar was placed in the left retromuscular space.  Under direct vision with a 5 mm camera, the left retromuscular space was dissected, freeing the rectus muscle from the posterior sheath.  This space was insufflated without incident. Examination revealed no evidence of bleeding and we were able to stay in the retromuscular position.  Three additional ports were placed, one in the left upper quadrant and one in the left flank, and one in the left lower quadrant, all in the retromuscular space.  We turned our attention to creation of the myofascial releases of the abdominal wall to facilitate primary fascial closure via a Robotic Rives-Stoppa approach.  We performed a left-sided myofascial flap via a posterior release, by mobilizing the retrorectus space. The left retromuscular space was released from the posterior sheath to the edge of the linea semilunaris to provide adequate medialization for repair of the ventral hernia.  The belly of the rectus muscle was identified and bluntly separated from the posterior rectus sheath out toward the linea semilunaris. This space was mobilized superiorly above the costal margin. Inferiorly, the retrorectus space was mobilized toward the pubis  taking care to preserve the inferior epigastric vessels.  We confirmed hemostasis.  We found we had about  a 13 cm release on the left side, facilitating bringing the linea alba close to the midline without tension. The right-side would not  medialize enough toward the midline. We again confirmed hemostasis in the posterior rectus sheath.     To facilitate tension-free primary fascial closure, we then decided to mobilize a Right-sided myofascial flap via a posterior release.  Then, attention was then turned to the linea alba and dissection was taken across the linea alba and into the right retromuscular space which was subsequently mobilized and released.  The belly of the rectus muscle was identified and bluntly separated from the posterior rectus sheath out to the linea semilunaris. The space was mobilized superiorly above the costal margin.   The epigastric vessels were preserved. Inferiorly, the retrorectus space was mobilized toward the pubis and out to the linea semilunaris, taking care to preserve the inferior epigastric vessels.   We confirmed hemostasis.   This provided adequate medialization for repair of the ventral hernia.  We had about a 13 cm release also on this side, which would allow [place] to bring the linea alba to midline without tension.     At this point, both sides would well medialize for tension-free primary fascial closure.  We confirmed hemostasis in these planes. We confirmed all sponge, needle, and instrument counts were reported correct. The resulting posterior sheath defect, after bilateral posterior rectus myofascial releases, measured 3cm x 4cm.  Having reduced the hernia in its entirety during our dissection and mobilization, the dissection revealed evidence of 6 cm x 4 cm herniation above the umbilicus.  The reduced contents were confirmed to be hemostatic and without damage.    Once our dissection was adequate, attention was turned to the anterior sheath and the anterior sheath was reapproximated with a #1 absorbable Stratafix Symmetric suture with relative ease. He had a relatively wide diastasis that was corrected as part of his abdominal wall reconstruction. The sheath defect was reapproximated with a 2-0  Stratafix Spiral suture to repair any component of peritoneal defect and facilitate placement of our retromuscular mesh.  Once we were satisfied with our dissection, a 25 x 30cm piece of Bard soft mesh was brought into the retromuscular space and placed with adequate coverage of the defect.  There was good attachment of the mesh and we were pleased with repair.  At this junction, the ports were all removed under direct visualization.  The laparoscopic wounds were closed with 0000 Monocryl sutures.  After confirming twice that the sponge, needle and instrument counts were correct, the procedure was terminated.  The patient was extubated and transferred to the recovery room in stable condition.  DISPOSITION: Stable to PACU.   Electronically Signed By: Moise Boring 03/16/2023 12:01 PM

## 2023-03-16 NOTE — Plan of Care (Signed)

## 2023-03-16 NOTE — Anesthesia Postprocedure Evaluation (Signed)
 Anesthesia Post Note  Patient: Stephen [name]  Procedure(s) Performed: ROBOTIC INCISIONAL HERAIR REPAIR WITH MESH     Patient location during evaluation: PACU Anesthesia Type: General Level of consciousness: awake and alert Pain management: pain level controlled Vital Signs Assessment: post-procedure vital signs reviewed and stable Respiratory status: spontaneous breathing, nonlabored ventilation and respiratory function stable Cardiovascular status: blood pressure returned to baseline and stable Postop Assessment: no apparent nausea or vomiting Anesthetic complications: no   No notable events documented.  Last Vitals:  Vitals:   03/16/23 1430 03/16/23 1533  BP: 135/84 (!) 141/80  Pulse: 66 81  Resp: 12 16  Temp:    SpO2: 100% 99%    Last Pain:  Vitals:   03/16/23 1533  TempSrc:   PainSc: 0-No pain                 Collene Schlichter

## 2023-03-16 NOTE — Transfer of Care (Signed)
 Immediate Anesthesia Transfer of Care Note  Patient: Stephen [name]  Procedure(s) Performed: ROBOTIC INCISIONAL HERAIR REPAIR WITH MESH  Patient Location: PACU  Anesthesia Type:General  Level of Consciousness: awake, alert , oriented, and patient cooperative  Airway & Oxygen Therapy: Patient Spontanous Breathing and Patient connected to face mask oxygen  Post-op Assessment: Report given to RN and Post -op Vital signs reviewed and stable  Post vital signs: Reviewed and stable  Last Vitals:  Vitals Value Taken Time  BP 155/90 03/16/23 1200  Temp 97   Pulse 77 03/16/23 1202  Resp 16 03/16/23 1202  SpO2 100 % 03/16/23 1202  Vitals shown include unfiled device data.  Last Pain:  Vitals:   03/16/23 0608  TempSrc:   PainSc: 0-No pain         Complications: No notable events documented.

## 2023-03-16 NOTE — Discharge Instructions (Signed)
 Home Care After Hernia Repair  Activity  Limit activity for the first 24 hours, then you may return to normal daily activities. Returning to normal daily activities as soon as you can following surgery will enhance recovery time.  No heavy lifting pushing or pulling, anything heavier than 10 pounds (gallon of milk weighs approx. 8.8 pounds) for 4-6 weeks from surgery date.  Do not mow the lawn, use a vacuum cleaner, or do any other strenuous activities without first consulting your surgical team.  Climb stairs slowly and watch your step.  Walk as often as you feel able to increase strength and endurance.  No driving or operating heavy machinery within 24 hours of taking narcotic pain medication.  Diet  Drink plenty of fluids and eat a light meal on the night of surgery. Some patients may find their appetite is poor for a week or two after surgery. This is a normal result of the stress of surgery-your appetite will return in time.   There are no specific diet restrictions after surgery.  Dressings and Wound Care  Dermabond/Durabond (skin glue): This will usually remain in place for 10-14 days, then naturally fall off your skin. You may take a shower 24 hrs after  surgery, carefully wash, not scrub the incision site with a mild non-scented soap. Pat dry with a soft towel.  Do not pick or peel skin glue off.  You can shower and let the water fall on the dressings above. Do not soak or submerge your incision(s) in a bath tub, hot tub, or swimming pool, until your doctor says it is ok to do so or the incision(s) have completely healed, usually about 2-4 weeks.  Do not use creams, powder, salves or balms on your incision(s).  What to Expect After Surgery   Moderate discomfort controlled with medications  Minimal drainage from incision  You may feel pain in one or both shoulders. This pain comes from the gas still left in your belly after the surgery, if you had laparoscopic surgery (several small  incisions). The pain should ease over several days to a week. Ambulation will help with this pain.   Belly swelling  Feeling fatigue and weak  Constipation after surgery is common. Drink plenty fluids and eat a high fiber diet.  Swelling - In some patients might feel that their hernia has returned after surgery-DO NOT Worry this is normal. Swelling may be due to the development of a seroma. Seroma is fluid that has built up where the hernia was repaired this is a normal result after surgery and it will slowly reabsorb back into your body over the next several weeks.   Pain Control: Prescribed Non-Narcotic Pain Medication  You will be given three prescriptions.  Two of them will be for prescription strength ibuprofen (i.e. Advil) and prescription strength acetaminophen (i.e. Tylenol).  The vast majority of patients will just need these two medications.  One prescription will be for a 'rescue' prescription of an oral narcotic (oxycodone).  You may fill this if needed.  You will alternate taking the ibuprofen (600mg ) every 6 hours and also the acetaminophen (650mg ) every 6 hours so that you are taking one of those medications every 3 hours.  For example: o 0800 - take ibuprofen 600mg  o 1100 - take acetaminophen 650mg  o 1400 - take ibuprofen 600mg  o 1700 - take acetaminophen 650mg  o Etc.  Continue taking this alternating pattern of ibuprofen and acetaminophen for 3 days  If you cannot take one  or the other of these medications, just take the one you can every 6 hours.  If you are comfortable at night, you don't have to wake up and take a medication.  If you are still uncomfortable after taking either ibuprofen or acetaminophen, try gentle stretching exercise and ice packs (a bag of frozen vegetables works great).  If you are still uncomfortable, you may fill the narcotic prescription of Oxycodone and take as directed.  Once you have completed these prescriptions, your pain level should be low enough  to stop taking medications altogether or just use an over the counter medication (ibuprofen or acetaminophen) as needed.   Pain Control: Over the Counter Medications to take as needed:  Colace/Docusate: May be prescribed by your surgeon to prevent constipation caused by the combination of narcotics, effects of anesthesia, and decreased ambulation.  Hold for loose stools or diarrhea. Take 100 mg 1-2 times a day starting tonight.   Fiber: High fiber foods, extra liquids (water 9-13 cups/day) can also assist with constipation. Examples of high fiber foods are fruit, bran. Prune juice and water are also good liquids to drink.  Milk of Magnesia/Miralax:  If constipated despite take the over the counter stool softeners, you may take Milk of Magnesia or Miralax as directed on bottle to assist with constipation.     Pepcid/Famotidine: May be prescribed while taking naproxen (Aleve) or other NSAIDs such as ibuprofen (Motrin/Advil) to prevent stomach upset or Acid-reflux symptoms. Take 1 tablet 1-2 times a day.   **Constipation: The first bowel movement may occur anywhere between 1-5 days after surgery.  As long as you are not nauseated or not having significant abdominal pain this variation is acceptable.  Narcotic pain medications can cause constipation increasing discomfort; early discontinuation will assist with bowel management.If constipated despite taking stool softeners, you may take Milk of Magnesia or Miralax as directed on the bottle.     **Home medications: You may restart your home medications as directed by your respective Primary Care Physician or Surgeon.  When to notify your Doctor or Healthcare Team:   Sign of Wound Infection   Fever over 100 degrees.  Wound becomes extremely swollen, shows red streaks, warm to the touch, and/or drainage from the incision site or foul-smelling drainage.  Wound edges separate or opens up  Bleeding or bruising   If you have bleeding, apply pressure to the  site and hold the pressure firmly for 5 minutes. If the bleeding continues, apply pressure again and call 911. If the bleeding stopped, call your doctor to report it.   Call your doctor or nurse if you have increased bleeding from your site and increased bruising or a lump forms or gets larger under your skin at the site.  Unrelieved Pain    Call your doctor or nurse if your pain gets worse or is not eased 1 hour after taking your pain medicine, or if it is severe and uncontrolled.  Nausea and Vomiting   Call your doctor or nurse if you have nausea and vomiting that continues more than 24 hours, will not let you keep medicine down and will not let you keep fluids down  Fever, Flu-like symptoms   Fever over 100 degrees and/or chills  Gastrointestinal Bleeding Symptoms    Black tarry bowel movements.  This can be normal after surgery on the stomach, but should resolve in a day or two.    Call 911 if you suddenly have signs of blood loss such as:  Vomiting blood  Fast heart rate  Feeling faint, sweaty, or blacking out  Passing bright red blood from your rectum  Blood Clot Symptoms   Tender, swollen or reddened areas in your calf muscle or thighs.  Numbness or tingling in your lower leg or calf, or at the top of your leg or groin  Skin on your leg looks pale or blue or feels cold to touch  Chest pain or have trouble breathing, lightheadedness, fast heart rate  Sudden Onset of Symptoms    Call 911 if you suddenly have:  Leg weakness and spasm  Loss of bladder or bowel function  Seizure  Confusion, severe headache, dizziness or feeling unsteady, problems talking, difficulty swallowing, and/or numbness or muscle weakness as these could be signs of a stroke.   Follow up Appointment Your follow up appointment should be scheduled 2-3 weeks after your surgery date.  If you have not previously scheduled for a follow-up visit you can be scheduled by contacting [phone].

## 2023-03-17 DIAGNOSIS — K432 Incisional hernia without obstruction or gangrene: Secondary | ICD-10-CM | POA: Diagnosis not present

## 2023-03-17 LAB — CBC
HCT: 38.6 % — ABNORMAL LOW (ref 39.0–52.0)
Hemoglobin: 13.1 g/dL (ref 13.0–17.0)
MCH: 32.3 pg (ref 26.0–34.0)
MCHC: 33.9 g/dL (ref 30.0–36.0)
MCV: 95.1 fL (ref 80.0–100.0)
Platelets: 148 10*3/uL — ABNORMAL LOW (ref 150–400)
RBC: 4.06 MIL/uL — ABNORMAL LOW (ref 4.22–5.81)
RDW: 12.3 % (ref 11.5–15.5)
WBC: 8 10*3/uL (ref 4.0–10.5)
nRBC: 0 % (ref 0.0–0.2)

## 2023-03-17 LAB — BASIC METABOLIC PANEL
Anion gap: 6 (ref 5–15)
BUN: 21 mg/dL — ABNORMAL HIGH (ref 6–20)
CO2: 26 mmol/L (ref 22–32)
Calcium: 8.6 mg/dL — ABNORMAL LOW (ref 8.9–10.3)
Chloride: 106 mmol/L (ref 98–111)
Creatinine, Ser: 1.15 mg/dL (ref 0.61–1.24)
GFR, Estimated: >60 mL/min (ref >60)
Glucose, Bld: 115 mg/dL — ABNORMAL HIGH (ref 70–99)
Potassium: 4 mmol/L (ref 3.5–5.1)
Sodium: 138 mmol/L (ref 135–145)

## 2023-03-17 NOTE — Progress Notes (Signed)
 1 Day Post-Op  Subjective: No issues overnight  Objective: Vital signs in last 24 hours: Temp:  [97.6 F (36.4 C)-98.9 F (37.2 C)] 98.3 F (36.8 C) (03/01 0559) Pulse Rate:  [64-91] 74 (03/01 0559) Resp:  [11-19] 18 (03/01 0559) BP: (110-159)/(69-93) 110/69 (03/01 0559) SpO2:  [94 %-100 %] 96 % (03/01 0559) FiO2 (%):  [21 %] 21 % (02/28 2216)   Intake/Output from previous day: 02/28 0701 - 03/01 0700 In: 2270 [P.O.:720; I.V.:1400; IV Piggyback:150] Out: 1650 [Urine:1550; Blood:100] Intake/Output this shift: Total I/O In: 240 [P.O.:240] Out: 200 [Urine:200]   General appearance: alert and cooperative GI: soft, mildly distended  Incision: no significant drainage  Lab Results:  Recent Labs    03/16/23 1230 03/17/23 0545  WBC 6.1 8.0  HGB 14.7 13.1  HCT 43.7 38.6*  PLT 148* 148*   BMET Recent Labs    03/16/23 1230 03/17/23 0545  NA  --  138  K  --  4.0  CL  --  106  CO2  --  26  GLUCOSE  --  115*  BUN  --  21*  CREATININE 1.22 1.15  CALCIUM  --  8.6*   PT/INR No results for input(s): "LABPROT", "INR" in the last 72 hours. ABG No results for input(s): "PHART", "HCO3" in the last 72 hours.  Invalid input(s): "PCO2", "PO2"  MEDS, Scheduled  acetaminophen  1,000 mg Oral Q6H   atorvastatin  10 mg Oral Daily   enoxaparin (LOVENOX) injection  40 mg Subcutaneous Q24H    Studies/Results: No results found.  Assessment: s/p Procedure(s): ROBOTIC INCISIONAL HERAIR REPAIR WITH MESH Patient Active Problem List   Diagnosis Date Noted   Incisional hernia 03/16/2023   Erectile dysfunction 09/19/2022   Dyslipidemia 09/19/2022   History of malignant neoplasm of skin 07/14/2021   Adhesive capsulitis of left shoulder 04/28/2020   Adverse reaction to NSAIDs 12/16/2019   Prostate cancer [facility]) s/p prostatectomy 2022 10/21/2019   Tinnitus of both ears 09/15/2019   Recurrent low back pain 09/13/2018   Stasis dermatitis 09/13/2018   Prediabetes 02/03/2017    Bilateral lower extremity edema 02/02/2017   Morbid obesity (HCC) 02/02/2017   OSA (obstructive sleep apnea) 02/02/2017    Expected post op course  Plan: Discharge   LOS: 0 days     .Vanita Panda, MD [facility], [place]    03/17/2023 9:51 AM

## 2023-03-17 NOTE — Progress Notes (Signed)
Pt was discharged home today. Instructions were reviewed with patient, and questions were answered. Pt was taken to main entrance via wheelchair by NT.  

## 2023-03-17 NOTE — Plan of Care (Signed)

## 2023-03-17 NOTE — Plan of Care (Signed)
  Problem: Activity: Goal: Risk for activity intolerance will decrease Outcome: Completed/Met   Problem: Nutrition: Goal: Adequate nutrition will be maintained Outcome: Completed/Met   Problem: Elimination: Goal: Will not experience complications related to urinary retention Outcome: Completed/Met   Problem: Pain Managment: Goal: General experience of comfort will improve and/or be controlled Outcome: Adequate for Discharge

## 2023-03-19 ENCOUNTER — Encounter (HOSPITAL_COMMUNITY): Payer: Self-pay | Admitting: General Surgery

## 2023-03-20 NOTE — Discharge Summary (Signed)
 Physician Discharge Summary  Patient ID: Stephen [name] MRN: [id number] DOB/AGE: 1970-08-27 53 y.o.  Admit date: 03/16/2023 Discharge date: 03/20/2023  Admission Diagnoses:  Discharge Diagnoses:  Principal Problem:   Incisional hernia   Discharged Condition: stable  Hospital Course: 53 y/o M who underwent a previous prostatectomy. He developed an incisional hernia that became increasingly symptomatic and desired repair.  On 03/16/2023 he was brought to the OR for elective robotic incisional hernia repair. A retrorectus repair was performed without complication.  He was admitted overnight for observation.  On POD 1 he discharged to home in stable condition.  He was tolerating PO, his pain was well controlled, and he was ambulating at baseline. He will follow up in the clinic.   Discharge Exam: Blood pressure 110/69, pulse 74, temperature 98.3 F (36.8 C), temperature source Oral, resp. rate 18, height 6\' 1"  (1.854 m), weight (!) 140.6 kg, SpO2 96%. See daily progress note  Disposition: Discharge disposition: 01-Home or Self Care        Allergies as of 03/17/2023       Reactions   Penicillins    Unknown - Childhood allergy   Nsaids    Pt told to avoid         Medication List     STOP taking these medications    Zepbound 2.5 MG/0.5ML Pen Generic drug: tirzepatide       TAKE these medications    acetaminophen 325 MG tablet Commonly known as: Tylenol Take 2 tablets (650 mg total) by mouth every 6 (six) hours for 6 days. What changed:  medication strength how much to take when to take this reasons to take this   atorvastatin 10 MG tablet Commonly known as: LIPITOR Take 1 tablet (10 mg total) by mouth daily.   ibuprofen 200 MG tablet Commonly known as: Motrin IB Take 3 tablets (600 mg total) by mouth every 6 (six) hours for 6 days. What changed:  how much to take when to take this reasons to take this   oxyCODONE 5 MG immediate release tablet Commonly  known as: Roxicodone Take 1 tablet (5 mg total) by mouth every 8 (eight) hours as needed for up to 5 days.   sildenafil 20 MG tablet Commonly known as: REVATIO Take 1-5 tablets (20-100 mg total) by mouth daily as needed (erectile dysfunction).        Follow-up Information     Moise Boring, MD. Schedule an appointment as soon as possible for a visit in 2 week(s).   Specialty: General Surgery Contact information: [address] [place] [address] [phone]                 Signed: Moise Boring 03/20/2023, 12:15 PM

## 2023-05-08 ENCOUNTER — Other Ambulatory Visit: Payer: Self-pay | Admitting: Cardiovascular Disease

## 2023-05-10 MED ORDER — ATORVASTATIN CALCIUM 10 MG PO TABS: 10.0000 mg | ORAL_TABLET | Freq: Every day | ORAL | 0 refills | Status: DC

## 2023-05-12 ENCOUNTER — Encounter: Payer: Self-pay | Admitting: Family Medicine

## 2023-05-14 NOTE — Telephone Encounter (Signed)
 Recommend he schedule office visit to discuss as this is a controlled substance.  Jinny Mounts. Daneil Dunker, MD 05/14/2023 10:14 AM

## 2023-05-14 NOTE — Telephone Encounter (Signed)
**Note De-identified  [place] Obfuscation** Please advise 

## 2023-05-15 ENCOUNTER — Ambulatory Visit: Admitting: Family Medicine

## 2023-05-15 ENCOUNTER — Encounter: Payer: Self-pay | Admitting: Family Medicine

## 2023-05-15 DIAGNOSIS — Z23 Encounter for immunization: Secondary | ICD-10-CM | POA: Diagnosis not present

## 2023-05-15 DIAGNOSIS — Z6841 Body Mass Index (BMI) 40.0 and over, adult: Secondary | ICD-10-CM

## 2023-05-15 MED ORDER — PHENTERMINE HCL 37.5 MG PO CAPS: 37.5000 mg | ORAL_CAPSULE | ORAL | 2 refills | Status: DC

## 2023-05-15 NOTE — Progress Notes (Signed)
   Stephen [name] is a 53 y.o. male who presents today for an office visit.  Assessment/Plan:  Chronic Problems Addressed Today: Morbid obesity (HCC) BMI 41.1.  Due to weight and comorbidities he would be a good candidate for medical management for his weight loss.  He is working on diet and exercise and has not had much success with any weight loss.  We had tried starting Zepbound  a few months ago however insurance would not pay for this.  He has checked with his insurance and they would not pay for any weight loss medications including Qsymia, Contrave, or any GLP-1 agonists.  He is willing to start phentermine .  He is able to pay for this out-of-pocket.  He has done well with this in the past.  We did discuss potential side effects of phentermine  including increased blood pressure, insomnia, irritability, etc.  He did not experience any of the side effects previously.  We will start today 37.5 mg daily.  He has done well with this dose in the past.  He will follow-up with us  in a few weeks via MyChart.  Come back for in person office visit in 2-3 months.   Preventative health care Prevnar 20 given today.    Subjective:  HPI:  See Assessment / plan for status of chronic conditions. Patient here today for follow up.  Here primarily to discuss weight management options.  I last saw him about 4 months ago.  At that time we started Zepbound  however insurance would not approve this.  He is still having difficulty with losing weight.  He is working on diet and exercise though has had difficulty with losing weight.  He has been on phentermine  in the past and has done well with it.  Has been several years since he has been on this.  Tolerated well without any significant side effects.  Would like to restart this if possible as this will be the most cost affordable option for him.  He has checked with insurance about other potential weight loss medications however none of these will be approved through his  insurance.       Objective:  Physical Exam: BP 116/80   Pulse 64   Temp (!) 97.2 F (36.2 C) (Temporal)   Ht 6\' 1"  (1.854 m)   Wt (!) 313 lb 12.8 oz (142.3 kg)   SpO2 100%   BMI 41.40 kg/m   Gen: No acute distress, resting comfortably CV: Regular rate and rhythm with no murmurs appreciated Pulm: Normal work of breathing, clear to auscultation bilaterally with no crackles, wheezes, or rhonchi Neuro: Grossly normal, moves all extremities Psych: Normal affect and thought content      Haig Gerardo M. Daneil Dunker, MD 05/15/2023 8:49 AM

## 2023-05-15 NOTE — Patient Instructions (Signed)
 It was very nice to see you today!  We will start phentermine .  Please follow-up with me in a few weeks let me know how you are doing with this.  Will see you back in office in 3 months.  Please continue to work on diet and exercise.  Will give your pneumonia vaccine today.  Return in about 3 months (around 08/14/2023) for Follow Up.   Take care, Dr Daneil Dunker  PLEASE NOTE:  If you had any lab tests, please let us  know if you have not heard back within a few days. You may see your results on mychart before we have a chance to review them but we will give you a call once they are reviewed by us .   If we ordered any referrals today, please let us  know if you have not heard from their office within the next week.   If you had any urgent prescriptions sent in today, please check with the pharmacy within an hour of our visit to make sure the prescription was transmitted appropriately.   Please try these tips to maintain a healthy lifestyle:  Eat at least 3 REAL meals and 1-2 snacks per day.  Aim for no more than 5 hours between eating.  If you eat breakfast, please do so within one hour of getting up.   Each meal should contain half fruits/vegetables, one quarter protein, and one quarter carbs (no bigger than a computer mouse)  Cut down on sweet beverages. This includes juice, soda, and sweet tea.   Drink at least 1 glass of water with each meal and aim for at least 8 glasses per day  Exercise at least 150 minutes every week.

## 2023-05-15 NOTE — Assessment & Plan Note (Signed)
 BMI 41.1.  Due to weight and comorbidities he would be a good candidate for medical management for his weight loss.  He is working on diet and exercise and has not had much success with any weight loss.  We had tried starting Zepbound  a few months ago however insurance would not pay for this.  He has checked with his insurance and they would not pay for any weight loss medications including Qsymia, Contrave, or any GLP-1 agonists.  He is willing to start phentermine .  He is able to pay for this out-of-pocket.  He has done well with this in the past.  We did discuss potential side effects of phentermine  including increased blood pressure, insomnia, irritability, etc.  He did not experience any of the side effects previously.  We will start today 37.5 mg daily.  He has done well with this dose in the past.  He will follow-up with us  in a few weeks via MyChart.  Come back for in person office visit in 2-3 months.

## 2023-05-31 NOTE — Telephone Encounter (Signed)
 Noted.  I appreciate at the update.  I am glad he is doing well.  He should come back for follow-up visit in 2 to 3 months.

## 2023-06-05 ENCOUNTER — Other Ambulatory Visit: Payer: Self-pay | Admitting: Cardiovascular Disease

## 2023-06-28 ENCOUNTER — Telehealth: Payer: Self-pay | Admitting: Cardiovascular Disease

## 2023-06-28 MED ORDER — ATORVASTATIN CALCIUM 10 MG PO TABS: 10.0000 mg | ORAL_TABLET | Freq: Every day | ORAL | 0 refills | Status: DC

## 2023-06-28 NOTE — Telephone Encounter (Signed)
 RX sent to requested Pharmacy

## 2023-06-28 NOTE — Telephone Encounter (Signed)
*  STAT* If patient is at the pharmacy, call can be transferred to refill team.   1. Which medications need to be refilled? (please list name of each medication and dose if known) atorvastatin  (LIPITOR) 10 MG tablet    2. Would you like to learn more about the convenience, safety, & potential cost savings by using the [facility]?     3. Are you open to using the [facility] (Type [facility]. ).   4. Which pharmacy/location (including street and city if local pharmacy) is medication to be sent to? [id number] - [place], [place] - [address]    5. Do they need a 30 day or 90 day supply? 90 day

## 2023-08-08 NOTE — Progress Notes (Signed)
 Cardiology Office Note   Date:  08/13/2023  ID:  Stephen [name], DOB September 03, 1970, MRN [id number] PCP: Kennyth Worth HERO, MD  [facility] Providers Cardiologist:  Darryle ONEIDA Decent, MD   History of Present Illness Stephen [name] is a 53 y.o. male with a hx of HLD who is being seen today for the evaluation of elevated coronary calcium  score at the request of Kennyth Worth HERO, MD. He reports he is doing well.  Sent for evaluation of calcium  score.  Elevated score.  Denies any chest pain or trouble breathing.  He plays pickle ball 2 hours 2 days/week.  He can do this without any limitations other than routine shortness of breath which is expected.  He is not diabetic.  LDL cholesterol 79.  He reported his parents died in a car crash 15 years ago.  No strong family history of heart disease.  He does have sleep apnea.  He uses his mask.  He is married.  He has 2 children.  They are out of the house.  He does have a history of prostate cancer underwent radiation therapy.  He does not smoke.  No alcohol or drug use.  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  He reports he is fairly sedentary but does play pickle ball 2 times per week.  Thyroid  studies are normal.  CV exam normal.  EKG is normal.   Today, he presents for a prescription refill and reevaluation.  His LDL cholesterol has improved from 79 to 52 since his last lab work in the fall of the previous year. He is on Lipitor to manage his cholesterol levels. A CT scan in January 2024 identified an elevated calcium  score.  He experiences chronic leg swelling, which he attributes to complications from a previous sclerotherapy attempt.  He has sleep apnea and uses a CPAP machine every night without issues. He has lost about 25 pounds since April through dietary changes and phentermine  use. 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 He consumes alcohol socially and does not smoke. His family history does not include strong heart  disease.  Discussed the use of AI scribe software for clinical note transcription with the patient, who gave verbal consent to proceed.  Reports no shortness of breath nor dyspnea on exertion. Reports no chest pain, pressure, or tightness. No edema, orthopnea, PND. Reports no palpitations.   ROS: pertinent ROS in HPI  Studies Reviewed     Exercise stress test 02/27/22  The study is normal. The study is low risk.   A Bruce protocol stress test was performed. Exercise capacity was normal. Patient exercised for 7 min and 15 sec. Maximum HR of 160 bpm. MPHR 93.0 %. Peak METS 7.0 . The patient experienced no angina during the test. The test was stopped because the patient experienced fatigue and dyspnea. The patient reported dyspnea and fatigue during the stress test. Normal blood pressure and normal heart rate response noted during stress. Heart rate recovery was normal.   No significant ST deviation was noted. There are beat to beat, primarily upsloping ST depressions that are nonspecific for ischemia. The ECG was negative for ischemia.   LV perfusion is normal. There is no evidence of ischemia. There is no evidence of infarction.   Left ventricular function is normal. Nuclear stress EF: 59 %. The left ventricular ejection fraction is normal (55-65%). End diastolic cavity size is normal. End systolic cavity size is normal. No evidence of  transient ischemic dilation (TID) noted.   Prior nuclear study not available for comparison.   Physical Exam VS:  BP 123/78 (BP Location: Left Arm, Patient Position: Sitting, Cuff Size: Large)   Pulse 78   Resp 16   Ht 6' 1 (1.854 m)   Wt 288 lb 6.4 oz (130.8 kg)   SpO2 99%   BMI 38.05 kg/m        Wt Readings from Last 3 Encounters:  08/13/23 288 lb 6.4 oz (130.8 kg)  05/15/23 (!) 313 lb 12.8 oz (142.3 kg)  03/16/23 (!) 310 lb (140.6 kg)    GEN: Well nourished, well developed in no acute distress NECK: No JVD; No carotid bruits CARDIAC: RRR, no  murmurs, rubs, gallops RESPIRATORY:  Clear to auscultation without rales, wheezing or rhonchi  ABDOMEN: Soft, non-tender, non-distended EXTREMITIES:  No edema; No deformity   ASSESSMENT AND PLAN  Venous Insufficiency Chronic venous insufficiency with persistent leg swelling. Previous sclerotherapy had mixed results with complications during the second treatment. -compression when up on his feet throughout the day  Hyperlipidemia Hyperlipidemia well-controlled with Lipitor. LDL reduced from 79 to 52. Weight loss of 25 pounds since April with dietary modifications. Potential for Lipitor discontinuation as weight loss continues. - Continue Lipitor. - Reassess need for Lipitor as weight loss progresses.  Sleep Apnea Sleep apnea managed with CPAP therapy, used nightly without issues. - Continue CPAP therapy.  Elevated calcium  score/CAD -congratulated him on  his weight loss -continue exercise for most days of the week and mediterranean diet  -continue lipitor     Dispo: He can follow-up in a year.   Signed, Orren LOISE Fabry, PA-C

## 2023-08-13 ENCOUNTER — Ambulatory Visit: Attending: Physician Assistant | Admitting: Physician Assistant

## 2023-08-13 ENCOUNTER — Encounter: Payer: Self-pay | Admitting: Physician Assistant

## 2023-08-13 VITALS — BP 123/78 | HR 78 | Resp 16 | Ht 73.0 in | Wt 288.4 lb

## 2023-08-13 DIAGNOSIS — E782 Mixed hyperlipidemia: Secondary | ICD-10-CM

## 2023-08-13 DIAGNOSIS — I2510 Atherosclerotic heart disease of native coronary artery without angina pectoris: Secondary | ICD-10-CM

## 2023-08-13 DIAGNOSIS — Z79899 Other long term (current) drug therapy: Secondary | ICD-10-CM

## 2023-08-13 DIAGNOSIS — R002 Palpitations: Secondary | ICD-10-CM

## 2023-08-13 DIAGNOSIS — R931 Abnormal findings on diagnostic imaging of heart and coronary circulation: Secondary | ICD-10-CM

## 2023-08-13 MED ORDER — ATORVASTATIN CALCIUM 10 MG PO TABS: 10.0000 mg | ORAL_TABLET | Freq: Every day | ORAL | 3 refills | Status: AC

## 2023-08-13 NOTE — Patient Instructions (Signed)
 Medication Instructions:   Your physician recommends that you continue on your current medications as directed. Please refer to the Current Medication list given to you today.  *If you need a refill on your cardiac medications before your next appointment, please call your pharmacy*    Follow-Up: At [facility], you and your health needs are our priority.  As part of our continuing mission to provide you with exceptional heart care, our providers are all part of one team.  This team includes your primary Cardiologist (physician) and Advanced Practice Providers or APPs (Physician Assistants and Nurse Practitioners) who all work together to provide you with the care you need, when you need it.  Your next appointment:   One year with  Provider:   Dr. Barbaraann

## 2023-08-14 ENCOUNTER — Ambulatory Visit: Admitting: Family Medicine

## 2023-08-15 ENCOUNTER — Ambulatory Visit: Admitting: Family Medicine

## 2023-08-15 ENCOUNTER — Encounter: Payer: Self-pay | Admitting: Family Medicine

## 2023-08-15 DIAGNOSIS — E785 Hyperlipidemia, unspecified: Secondary | ICD-10-CM

## 2023-08-15 DIAGNOSIS — R7303 Prediabetes: Secondary | ICD-10-CM | POA: Diagnosis not present

## 2023-08-15 MED ORDER — PHENTERMINE HCL 37.5 MG PO CAPS: 37.5000 mg | ORAL_CAPSULE | ORAL | 2 refills | Status: AC

## 2023-08-15 NOTE — Assessment & Plan Note (Signed)
Check lipids when he comes back for CPE in a few months. °

## 2023-08-15 NOTE — Assessment & Plan Note (Signed)
 He is doing very well since coming.  He is down 27 pounds since our last visit here 3 months ago.  He has been tolerating well without any significant side effects.  We did discuss that phentermine  is typically used for short duration however given his significant weight loss over the last few months would be reasonable to continue this for another few months especially since he has been tolerating well.  He will come back in a few months for CPE and we can recheck at that time.

## 2023-08-15 NOTE — Assessment & Plan Note (Signed)
 We will check A1c when he comes back in for his Comprehensive Physical Exam (CPE) preventive care annual visit.

## 2023-08-15 NOTE — Progress Notes (Signed)
   Stephen [name] is a 53 y.o. male who presents today for an office visit.  Assessment/Plan:  Chronic Problems Addressed Today: Morbid obesity (HCC) He is doing very well since coming.  He is down 27 pounds since our last visit here 3 months ago.  He has been tolerating well without any significant side effects.  We did discuss that phentermine  is typically used for short duration however given his significant weight loss over the last few months would be reasonable to continue this for another few months especially since he has been tolerating well.  He will come back in a few months for CPE and we can recheck at that time.  Prediabetes We will check A1c when he comes back in for his Comprehensive Physical Exam (CPE) preventive care annual visit.   Dyslipidemia Check lipids when he comes back for CPE in a few months.     Subjective:  HPI:  See A/P for status of chronic conditions.  Patient is here today for follow-up.  I last saw her about 3 months ago.  We discussed weight management at that time.  We started him on phentermine .  He has done well with this. He has noticed that this has decreased appetite. He is trying to stay more active. He has not had any significant side effects with the phentermine . No palpitations. No mood issues.        Objective:  Physical Exam: BP 106/71   Pulse 77   Temp (!) 97.2 F (36.2 C) (Temporal)   Ht 6' 1 (1.854 m)   Wt 286 lb (129.7 kg)   SpO2 98%   BMI 37.73 kg/m   Wt Readings from Last 3 Encounters:  08/15/23 286 lb (129.7 kg)  08/13/23 288 lb 6.4 oz (130.8 kg)  05/15/23 (!) 313 lb 12.8 oz (142.3 kg)    Gen: No acute distress, resting comfortably CV: Regular rate and rhythm with no murmurs appreciated Pulm: Normal work of breathing, clear to auscultation bilaterally with no crackles, wheezes, or rhonchi Neuro: Grossly normal, moves all extremities Psych: Normal affect and thought content      Stanisha Lorenz M. Kennyth, MD 08/15/2023 8:17 AM

## 2023-08-15 NOTE — Patient Instructions (Signed)
 It was very nice to see you today!  I am glad that you are doing well! Please keep up the great work!  I will refill your phentermine  today.  Return for Annual Physical.   Take care, Dr Kennyth  PLEASE NOTE:  If you had any lab tests, please let us  know if you have not heard back within a few days. You may see your results on mychart before we have a chance to review them but we will give you a call once they are reviewed by us .   If we ordered any referrals today, please let us  know if you have not heard from their office within the next week.   If you had any urgent prescriptions sent in today, please check with the pharmacy within an hour of our visit to make sure the prescription was transmitted appropriately.   Please try these tips to maintain a healthy lifestyle:  Eat at least 3 REAL meals and 1-2 snacks per day.  Aim for no more than 5 hours between eating.  If you eat breakfast, please do so within one hour of getting up.   Each meal should contain half fruits/vegetables, one quarter protein, and one quarter carbs (no bigger than a computer mouse)  Cut down on sweet beverages. This includes juice, soda, and sweet tea.   Drink at least 1 glass of water with each meal and aim for at least 8 glasses per day  Exercise at least 150 minutes every week.

## 2023-09-17 ENCOUNTER — Encounter (INDEPENDENT_AMBULATORY_CARE_PROVIDER_SITE_OTHER): Payer: Self-pay

## 2023-09-21 ENCOUNTER — Encounter: Payer: Self-pay | Admitting: Family Medicine

## 2023-09-21 ENCOUNTER — Ambulatory Visit (INDEPENDENT_AMBULATORY_CARE_PROVIDER_SITE_OTHER): Payer: Commercial Managed Care - HMO | Admitting: Family Medicine

## 2023-09-21 VITALS — BP 117/80 | HR 79 | Temp 97.3°F | Ht 73.0 in | Wt 277.4 lb

## 2023-09-21 DIAGNOSIS — N529 Male erectile dysfunction, unspecified: Secondary | ICD-10-CM

## 2023-09-21 DIAGNOSIS — Z0001 Encounter for general adult medical examination with abnormal findings: Secondary | ICD-10-CM

## 2023-09-21 DIAGNOSIS — E785 Hyperlipidemia, unspecified: Secondary | ICD-10-CM

## 2023-09-21 DIAGNOSIS — R7303 Prediabetes: Secondary | ICD-10-CM | POA: Diagnosis not present

## 2023-09-21 LAB — CBC
HCT: 44.9 % (ref 39.0–52.0)
Hemoglobin: 15.1 g/dL (ref 13.0–17.0)
MCHC: 33.6 g/dL (ref 30.0–36.0)
MCV: 92.2 fl (ref 78.0–100.0)
Platelets: 163 K/uL (ref 150.0–400.0)
RBC: 4.87 Mil/uL (ref 4.22–5.81)
RDW: 13.2 % (ref 11.5–15.5)
WBC: 3.1 K/uL — ABNORMAL LOW (ref 4.0–10.5)

## 2023-09-21 LAB — LIPID PANEL
Cholesterol: 90 mg/dL (ref 0–200)
HDL: 42.7 mg/dL (ref >39.00)
LDL Cholesterol: 37 mg/dL (ref 0–99)
NonHDL: 47.26
Total CHOL/HDL Ratio: 2
Triglycerides: 51 mg/dL (ref 0.0–149.0)
VLDL: 10.2 mg/dL (ref 0.0–40.0)

## 2023-09-21 LAB — COMPREHENSIVE METABOLIC PANEL WITH GFR
ALT: 17 U/L (ref 0–53)
AST: 16 U/L (ref 0–37)
Albumin: 4.1 g/dL (ref 3.5–5.2)
Alkaline Phosphatase: 69 U/L (ref 39–117)
BUN: 13 mg/dL (ref 6–23)
CO2: 28 meq/L (ref 19–32)
Calcium: 9 mg/dL (ref 8.4–10.5)
Chloride: 105 meq/L (ref 96–112)
Creatinine, Ser: 1.09 mg/dL (ref 0.40–1.50)
GFR: 78.57 mL/min (ref >60.00)
Glucose, Bld: 85 mg/dL (ref 70–99)
Potassium: 3.9 meq/L (ref 3.5–5.1)
Sodium: 141 meq/L (ref 135–145)
Total Bilirubin: 0.9 mg/dL (ref 0.2–1.2)
Total Protein: 6.9 g/dL (ref 6.0–8.3)

## 2023-09-21 LAB — TSH: TSH: 2.32 u[IU]/mL (ref 0.35–5.50)

## 2023-09-21 LAB — HEMOGLOBIN A1C: Hgb A1c MFr Bld: 5.7 % (ref 4.6–6.5)

## 2023-09-21 NOTE — Progress Notes (Signed)
 Chief Complaint:  Stephen [name] is a 53 y.o. male who presents today for his annual comprehensive physical exam.    Assessment/Plan:  Chronic Problems Addressed Today: Morbid obesity (HCC) He has lost an additional 9 pounds since our last visit and is down about 40 pounds overall since starting phentermine .  Doing very well with this.  Having occasional dry mouth but this is tolerable.  Given he has good success with phentermine , it would be reasonable for us  to continue for another few months.  Does not need refill today.  He will come back in 3 to 6 months for weight check.  He will let us  know if he has any issues between now and her next follow-up visit.  Prediabetes Check A1c with labs.  He is doing a great job with weight loss.  Erectile dysfunction Stable on sildenafil  as needed.  Does not need refill today.  Dyslipidemia Check lipids.  He is on Lipitor 10 mg daily per cardiology.  Preventative Healthcare: Check labs.  He will get flu and COVID-vaccine at the pharmacy.  Up-to-date on colon cancer screening  Patient Counseling(The following topics were reviewed and/or handout was given):  -Nutrition: Stressed importance of moderation in sodium/caffeine intake, saturated fat and cholesterol, caloric balance, sufficient intake of fresh fruits, vegetables, and fiber.  -Stressed the importance of regular exercise.   -Substance Abuse: Discussed cessation/primary prevention of tobacco, alcohol, or other drug use; driving or other dangerous activities under the influence; availability of treatment for abuse.   -Injury prevention: Discussed safety belts, safety helmets, smoke detector, smoking near bedding or upholstery.   -Sexuality: Discussed sexually transmitted diseases, partner selection, use of condoms, avoidance of unintended pregnancy and contraceptive alternatives.   -Dental health: Discussed importance of regular tooth brushing, flossing, and dental visits.  -Health maintenance  and immunizations reviewed. Please refer to Health maintenance section.  Return to care in 1 year for next preventative visit.     Subjective:  HPI:  He has no acute complaints today. Patient is here today for his  annual physical.  See assessment / plan for status of chronic conditions.  Discussed the use of AI scribe software for clinical note transcription with the patient, who gave verbal consent to proceed.  History of Present Illness Stephen [name] is a 53 year old male who presents for an annual physical exam.  He has experienced a weight loss of nine pounds in the past month, totaling forty pounds since December. He attributes this to the use of phentermine , which he takes most days for appetite control. He experiences dry mouth as a side effect. He has a history of weight issues since middle school and has been using phentermine  intermittently, with the most recent refill started just yesterday.  He engages in physical activity by playing pickleball once a week for about an hour to an hour and a half and goes for walks with his wife, aiming for at least two miles. He tries to keep his exercise in a good range. He has a sedentary job.  He is currently taking Lipitor and sildenafil  and does not require refills at this time. He has been informed by his cardiologist that if his numbers remain stable, he might be able to discontinue Lipitor next year.  He is due for a flu vaccine and a COVID shot, which [facility] [employer]. He has had his pneumonia and shingles vaccines earlier this year.  He has a history of  hernia surgery and notes some numbness in the area.     Lifestyle Diet: Cutting down on portion sizes.  Exercise: Trying to walk more.      09/21/2023    7:58 AM  Depression screen PHQ 2/9  Decreased Interest 0  Down, Depressed, Hopeless 0  PHQ - 2 Score 0    There are no preventive care reminders to display for this patient.   ROS: Per HPI,  otherwise a complete review of systems was negative.   PMH:  The following were reviewed and entered/updated in epic: Past Medical History:  Diagnosis Date   Basal cell carcinoma    multiple skin cancers removed, prostate cancer    Degenerative disc disease, lumbar    Left shoulder pain    frozen shoulder per tp    Prostate CA (HCC)    Ruptured cervical disc    Sleep apnea    no cpap   Patient Active Problem List   Diagnosis Date Noted   Incisional hernia 03/16/2023   Erectile dysfunction 09/19/2022   Dyslipidemia 09/19/2022   History of malignant neoplasm of skin 07/14/2021   Adhesive capsulitis of left shoulder 04/28/2020   Adverse reaction to NSAIDs 12/16/2019   Prostate cancer [facility]) s/p prostatectomy 2022 10/21/2019   Tinnitus of both ears 09/15/2019   Recurrent low back pain 09/13/2018   Stasis dermatitis 09/13/2018   Prediabetes 02/03/2017   Bilateral lower extremity edema 02/02/2017   Morbid obesity (HCC) 02/02/2017   OSA (obstructive sleep apnea) 02/02/2017   Past Surgical History:  Procedure Laterality Date   APPENDECTOMY     EYE MUSCLE SURGERY     2010/2011--lazy   LYMPHADENECTOMY Bilateral 05/27/2020   Procedure: LYMPHADENECTOMY, PELVIC;  Surgeon: Renda Glance, MD;  Location: [facility];  Service: Urology;  Laterality: Bilateral;   ROBOT ASSISTED LAPAROSCOPIC RADICAL PROSTATECTOMY N/A 05/27/2020   Procedure: XI ROBOTIC ASSISTED LAPAROSCOPIC RADICAL PROSTATECTOMY LEVEL 2;  Surgeon: Renda Glance, MD;  Location: [facility];  Service: Urology;  Laterality: N/A;   WISDOM TOOTH EXTRACTION     XI ROBOTIC ASSISTED VENTRAL HERNIA N/A 03/16/2023   Procedure: ROBOTIC INCISIONAL HERAIR REPAIR WITH MESH;  Surgeon: Polly Cordella LABOR, MD;  Location: [facility];  Service: General;  Laterality: N/A;    Family History  Problem Relation Age of Onset   Hypertension Mother    Ovarian cancer Maternal Grandmother 50   Esophageal cancer Maternal Grandfather 55   Colon cancer Neg Hx     Colon polyps Neg Hx    Rectal cancer Neg Hx    Stomach cancer Neg Hx     Medications- reviewed and updated Current Outpatient Medications  Medication Sig Dispense Refill   atorvastatin  (LIPITOR) 10 MG tablet Take 1 tablet (10 mg total) by mouth daily. 90 tablet 3   phentermine  37.5 MG capsule Take 1 capsule (37.5 mg total) by mouth every morning. 30 capsule 2   sildenafil  (REVATIO ) 20 MG tablet Take 1-5 tablets (20-100 mg total) by mouth daily as needed (erectile dysfunction). 90 tablet 3   No current facility-administered medications for this visit.    Allergies-reviewed and updated Allergies  Allergen Reactions   Penicillins     Unknown - Childhood allergy   Nsaids     Pt told to avoid     Social History   Socioeconomic History   Marital status: Married    Spouse name: Not on file   Number of children: 2   Years of education: Not on file  Highest education level: Bachelor's degree (e.g., BA, AB, BS)  Occupational History   Occupation: Acupuncturist - [employer]  Tobacco Use   Smoking status: Never   Smokeless tobacco: Never  Vaping Use   Vaping status: Never Used  Substance and Sexual Activity   Alcohol use: Yes    Alcohol/week: 4.0 standard drinks of alcohol    Types: 4 Standard drinks or equivalent per week    Comment: Socially   Drug use: No   Sexual activity: Yes    Partners: Female  Other Topics Concern   Not on file  Social History Narrative   Not on file   Social Drivers of Health   Financial Resource Strain: Low Risk  (08/11/2023)   Overall Financial Resource Strain (CARDIA)    Difficulty of Paying Living Expenses: Not hard at all  Food Insecurity: No Food Insecurity (08/11/2023)   Hunger Vital Sign    Worried About Running Out of Food in the Last Year: Never true    Ran Out of Food in the Last Year: Never true  Transportation Needs: No Transportation Needs (08/11/2023)   PRAPARE - Administrator, Civil Service (Medical): No    Lack of  Transportation (Non-Medical): No  Physical Activity: Insufficiently Active (08/11/2023)   Exercise Vital Sign    Days of Exercise per Week: 2 days    Minutes of Exercise per Session: 60 min  Stress: Stress Concern Present (08/11/2023)   [employer] of Occupational Health - Occupational Stress Questionnaire    Feeling of Stress: To some extent  Social Connections: Socially Integrated (08/11/2023)   Social Connection and Isolation Panel    Frequency of Communication with Friends and Family: Twice a week    Frequency of Social Gatherings with Friends and Family: Once a week    Attends Religious Services: 1 to 4 times per year    Active Member of [employer] or Organizations: Yes    Attends Engineer, structural: More than 4 times per year    Marital Status: Married        Objective:  Physical Exam: BP 117/80   Pulse 79   Temp (!) 97.3 F (36.3 C) (Temporal)   Ht 6' 1 (1.854 m)   Wt 277 lb 6.4 oz (125.8 kg)   SpO2 98%   BMI 36.60 kg/m   Body mass index is 36.6 kg/m. Wt Readings from Last 3 Encounters:  09/21/23 277 lb 6.4 oz (125.8 kg)  08/15/23 286 lb (129.7 kg)  08/13/23 288 lb 6.4 oz (130.8 kg)  Gen: NAD, resting comfortably HEENT: TMs normal bilaterally. OP clear. No thyromegaly noted.  CV: RRR with no murmurs appreciated Pulm: NWOB, CTAB with no crackles, wheezes, or rhonchi GI: Normal bowel sounds present. Soft, Nontender, Nondistended. MSK: no edema, cyanosis, or clubbing noted Skin: warm, dry Neuro: CN2-12 grossly intact. Strength 5/5 in upper and lower extremities. Reflexes symmetric and intact bilaterally.  Psych: Normal affect and thought content     Lashanna Angelo M. Kennyth, MD 09/21/2023 8:19 AM

## 2023-09-21 NOTE — Assessment & Plan Note (Signed)
 Check A1c with labs.  He is doing a great job with weight loss.

## 2023-09-21 NOTE — Assessment & Plan Note (Signed)
 He has lost an additional 9 pounds since our last visit and is down about 40 pounds overall since starting phentermine .  Doing very well with this.  Having occasional dry mouth but this is tolerable.  Given he has good success with phentermine , it would be reasonable for us  to continue for another few months.  Does not need refill today.  He will come back in 3 to 6 months for weight check.  He will let us  know if he has any issues between now and her next follow-up visit.

## 2023-09-21 NOTE — Assessment & Plan Note (Signed)
Stable on sildenafil as needed.  Does not need refill today. 

## 2023-09-21 NOTE — Patient Instructions (Addendum)
 It was very nice to see you today!  VISIT SUMMARY: You had your annual physical exam today. Your blood pressure is well-controlled, and you have successfully managed your weight. Blood work will be done to check your cholesterol and A1c levels. You will be contacted once the results are available.  YOUR PLAN: WEIGHT MANAGEMENT: You have lost 40 pounds since December though you experience dry mouth as a side effect. You maintain regular physical activity and portion control. -Continue taking phentermine  as prescribed. -Engage in regular physical activity, aiming for 30 minutes per day on average. -Monitor your weight and adjust activity levels if weight loss plateaus. -Consider varying calorie intake if weight loss plateaus.  PREDIABETES: Your A1c levels will be monitored with blood work. -Continue with lifestyle modifications, including diet and exercise.  DYSLIPIDEMIA: Your dyslipidemia is managed with Lipitor. The cardiologist may consider discontinuing the medication if lipid levels remain stable. -Continue taking Lipitor as prescribed. -Blood work will assess your current lipid levels.  GENERAL HEALTH MAINTENANCE: You are due for a flu vaccine and a COVID shot. -[facility] [employer]  Return in about 6 months (around 03/20/2024) for Follow Up.   Take care, Dr Kennyth  PLEASE NOTE:  If you had any lab tests, please let us  know if you have not heard back within a few days. You may see your results on mychart before we have a chance to review them but we will give you a call once they are reviewed by us .   If we ordered any referrals today, please let us  know if you have not heard from their office within the next week.   If you had any urgent prescriptions sent in today, please check with the pharmacy within an hour of our visit to make sure the prescription was transmitted appropriately.   Please try these tips to maintain a healthy lifestyle:  Eat  at least 3 REAL meals and 1-2 snacks per day.  Aim for no more than 5 hours between eating.  If you eat breakfast, please do so within one hour of getting up.   Each meal should contain half fruits/vegetables, one quarter protein, and one quarter carbs (no bigger than a computer mouse)  Cut down on sweet beverages. This includes juice, soda, and sweet tea.   Drink at least 1 glass of water with each meal and aim for at least 8 glasses per day  Exercise at least 150 minutes every week.     Preventive Care 51-51 Years Old, Male Preventive care refers to lifestyle choices and visits with your health care provider that can promote health and wellness. Preventive care visits are also called wellness exams. What can I expect for my preventive care visit? Counseling During your preventive care visit, your health care provider may ask about your: Medical history, including: Past medical problems. Family medical history. Current health, including: Emotional well-being. Home life and relationship well-being. Sexual activity. Lifestyle, including: Alcohol, nicotine or tobacco, and drug use. Access to firearms. Diet, exercise, and sleep habits. Safety issues such as seatbelt and bike helmet use. Sunscreen use. Work and work Astronomer. Physical exam Your health care provider will check your: Height and weight. These may be used to calculate your BMI (body mass index). BMI is a measurement that tells if you are at a healthy weight. Waist circumference. This measures the distance around your waistline. This measurement also tells if you are at a healthy weight and may  help predict your risk of certain diseases, such as type 2 diabetes and high blood pressure. Heart rate and blood pressure. Body temperature. Skin for abnormal spots. What immunizations do I need?  Vaccines are usually given at various ages, according to a schedule. Your health care provider will recommend vaccines for you  based on your age, medical history, and lifestyle or other factors, such as travel or where you work. What tests do I need? Screening Your health care provider may recommend screening tests for certain conditions. This may include: Lipid and cholesterol levels. Diabetes screening. This is done by checking your blood sugar (glucose) after you have not eaten for a while (fasting). Hepatitis B test. Hepatitis C test. HIV (human immunodeficiency virus) test. STI (sexually transmitted infection) testing, if you are at risk. Lung cancer screening. Prostate cancer screening. Colorectal cancer screening. Talk with your health care provider about your test results, treatment options, and if necessary, the need for more tests. Follow these instructions at home: Eating and drinking  Eat a diet that includes fresh fruits and vegetables, whole grains, lean protein, and low-fat dairy products. Take vitamin and mineral supplements as recommended by your health care provider. Do not drink alcohol if your health care provider tells you not to drink. If you drink alcohol: Limit how much you have to 0-2 drinks a day. Know how much alcohol is in your drink. In the [place] one drink equals one 12 oz bottle of beer (355 mL), one 5 oz glass of wine (148 mL), or one 1 oz glass of hard liquor (44 mL). Lifestyle Brush your teeth every morning and night with fluoride toothpaste. Floss one time each day. Exercise for at least 30 minutes 5 or more days each week. Do not use any products that contain nicotine or tobacco. These products include cigarettes, chewing tobacco, and vaping devices, such as e-cigarettes. If you need help quitting, ask your health care provider. Do not use drugs. If you are sexually active, practice safe sex. Use a condom or other form of protection to prevent STIs. Take aspirin only as told by your health care provider. Make sure that you understand how much to take and what form to take.  Work with your health care provider to find out whether it is safe and beneficial for you to take aspirin daily. Find healthy ways to manage stress, such as: Meditation, yoga, or listening to music. Journaling. Talking to a trusted person. Spending time with friends and family. Minimize exposure to UV radiation to reduce your risk of skin cancer. Safety Always wear your seat belt while driving or riding in a vehicle. Do not drive: If you have been drinking alcohol. Do not ride with someone who has been drinking. When you are tired or distracted. While texting. If you have been using any mind-altering substances or drugs. Wear a helmet and other protective equipment during sports activities. If you have firearms in your house, make sure you follow all gun safety procedures. What's next? Go to your health care provider once a year for an annual wellness visit. Ask your health care provider how often you should have your eyes and teeth checked. Stay up to date on all vaccines. This information is not intended to replace advice given to you by your health care provider. Make sure you discuss any questions you have with your health care provider. Document Revised: 06/30/2020 Document Reviewed: 06/30/2020 Elsevier Patient Education  2024 [employer].

## 2023-09-21 NOTE — Assessment & Plan Note (Signed)
 Check lipids.  He is on Lipitor 10 mg daily per cardiology.

## 2023-09-25 ENCOUNTER — Ambulatory Visit: Payer: Self-pay | Admitting: Family Medicine

## 2023-09-25 NOTE — Progress Notes (Signed)
 His white blood cell counts are little bit low.  This is similar to his previous values over the last couple years.  We can recheck again at his next office visit.  His A1c is borderline elevated.  Do not need to start meds for this but he should work on diet and exercise and we can recheck this again in a year.  All his other labs are at goal and we can recheck next year.

## 2024-03-20 ENCOUNTER — Ambulatory Visit: Admitting: Family Medicine
